# Patient Record
Sex: Female | Born: 1990
Health system: Southern US, Community
[De-identification: ages and names within clinical notes are randomized; demographics above are authoritative.]

## PROBLEM LIST (undated history)

## (undated) ENCOUNTER — Inpatient Hospital Stay: Payer: Self-pay

## (undated) DIAGNOSIS — F32A Depression, unspecified: Secondary | ICD-10-CM

## (undated) DIAGNOSIS — E669 Obesity, unspecified: Secondary | ICD-10-CM

## (undated) DIAGNOSIS — Z8616 Personal history of COVID-19: Secondary | ICD-10-CM

## (undated) DIAGNOSIS — E1110 Type 2 diabetes mellitus with ketoacidosis without coma: Secondary | ICD-10-CM

## (undated) DIAGNOSIS — E119 Type 2 diabetes mellitus without complications: Secondary | ICD-10-CM

## (undated) DIAGNOSIS — Z9641 Presence of insulin pump (external) (internal): Secondary | ICD-10-CM

## (undated) DIAGNOSIS — K8050 Calculus of bile duct without cholangitis or cholecystitis without obstruction: Secondary | ICD-10-CM

## (undated) DIAGNOSIS — O139 Gestational [pregnancy-induced] hypertension without significant proteinuria, unspecified trimester: Secondary | ICD-10-CM

## (undated) DIAGNOSIS — I10 Essential (primary) hypertension: Secondary | ICD-10-CM

## (undated) HISTORY — PX: WISDOM TOOTH EXTRACTION: SHX21

## (undated) HISTORY — DX: Essential (primary) hypertension: I10

## (undated) HISTORY — PX: CHOLECYSTECTOMY: SHX55

---
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
## 2011-03-03 ENCOUNTER — Emergency Department: Payer: Self-pay | Admitting: Emergency Medicine

## 2011-06-08 ENCOUNTER — Observation Stay: Payer: Self-pay | Admitting: Obstetrics and Gynecology

## 2011-08-01 DIAGNOSIS — O1410 Severe pre-eclampsia, unspecified trimester: Secondary | ICD-10-CM

## 2013-01-16 ENCOUNTER — Emergency Department: Payer: Self-pay | Admitting: Emergency Medicine

## 2013-01-16 LAB — URINALYSIS, COMPLETE
Bilirubin,UR: NEGATIVE
Glucose,UR: 300 mg/dL (ref 0–75)
Ketone: NEGATIVE
Leukocyte Esterase: NEGATIVE
Ph: 6 (ref 4.5–8.0)
RBC,UR: 131 /HPF (ref 0–5)
Specific Gravity: 1.01 (ref 1.003–1.030)
Squamous Epithelial: 2

## 2013-01-16 LAB — HCG, QUANTITATIVE, PREGNANCY: Beta Hcg, Quant.: <1 m[IU]/mL — ABNORMAL LOW

## 2013-01-16 LAB — BASIC METABOLIC PANEL
Anion Gap: 8 (ref 7–16)
BUN: 21 mg/dL — ABNORMAL HIGH (ref 7–18)
Calcium, Total: 9.3 mg/dL (ref 8.5–10.1)
Chloride: 101 mmol/L (ref 98–107)
Creatinine: 0.68 mg/dL (ref 0.60–1.30)
EGFR (African American): >60
EGFR (Non-African Amer.): >60
Potassium: 4.2 mmol/L (ref 3.5–5.1)

## 2013-01-16 LAB — CBC
HCT: 46.2 % (ref 35.0–47.0)
HGB: 16.2 g/dL — ABNORMAL HIGH (ref 12.0–16.0)
MCH: 31.9 pg (ref 26.0–34.0)
RBC: 5.1 10*6/uL (ref 3.80–5.20)
RDW: 12.6 % (ref 11.5–14.5)
WBC: 10.5 10*3/uL (ref 3.6–11.0)

## 2013-09-23 ENCOUNTER — Emergency Department: Payer: Self-pay | Admitting: Emergency Medicine

## 2013-09-23 LAB — COMPREHENSIVE METABOLIC PANEL
ANION GAP: 4 — AB (ref 7–16)
AST: 46 U/L — AB (ref 15–37)
Albumin: 4.3 g/dL (ref 3.4–5.0)
Alkaline Phosphatase: 146 U/L — ABNORMAL HIGH
BILIRUBIN TOTAL: 1.1 mg/dL — AB (ref 0.2–1.0)
BUN: 18 mg/dL (ref 7–18)
CALCIUM: 9.4 mg/dL (ref 8.5–10.1)
CO2: 27 mmol/L (ref 21–32)
CREATININE: 0.54 mg/dL — AB (ref 0.60–1.30)
Chloride: 100 mmol/L (ref 98–107)
Glucose: 300 mg/dL — ABNORMAL HIGH (ref 65–99)
Osmolality: 276 (ref 275–301)
Potassium: 4.2 mmol/L (ref 3.5–5.1)
SGPT (ALT): 87 U/L — ABNORMAL HIGH (ref 12–78)
Sodium: 131 mmol/L — ABNORMAL LOW (ref 136–145)
Total Protein: 8.9 g/dL — ABNORMAL HIGH (ref 6.4–8.2)

## 2013-09-23 LAB — CBC WITH DIFFERENTIAL/PLATELET
Basophil #: 0.1 10*3/uL (ref 0.0–0.1)
Basophil %: 0.5 %
Eosinophil #: 0.1 10*3/uL (ref 0.0–0.7)
Eosinophil %: 0.9 %
HCT: 47.5 % — AB (ref 35.0–47.0)
HGB: 16.3 g/dL — ABNORMAL HIGH (ref 12.0–16.0)
LYMPHS ABS: 2.4 10*3/uL (ref 1.0–3.6)
LYMPHS PCT: 15.4 %
MCH: 31.2 pg (ref 26.0–34.0)
MCHC: 34.4 g/dL (ref 32.0–36.0)
MCV: 91 fL (ref 80–100)
MONOS PCT: 5.9 %
Monocyte #: 0.9 x10 3/mm (ref 0.2–0.9)
Neutrophil #: 12.2 10*3/uL — ABNORMAL HIGH (ref 1.4–6.5)
Neutrophil %: 77.3 %
Platelet: 333 10*3/uL (ref 150–440)
RBC: 5.24 10*6/uL — ABNORMAL HIGH (ref 3.80–5.20)
RDW: 12.2 % (ref 11.5–14.5)
WBC: 15.8 10*3/uL — ABNORMAL HIGH (ref 3.6–11.0)

## 2013-09-23 LAB — URINALYSIS, COMPLETE
Bilirubin,UR: NEGATIVE
Glucose,UR: >=500 mg/dL (ref 0–75)
NITRITE: POSITIVE
Ph: 6 (ref 4.5–8.0)
SPECIFIC GRAVITY: 1.031 (ref 1.003–1.030)
Squamous Epithelial: 1
WBC UR: 168 /HPF (ref 0–5)

## 2013-09-23 LAB — LIPASE, BLOOD: LIPASE: 111 U/L (ref 73–393)

## 2016-03-04 ENCOUNTER — Ambulatory Visit
Admission: EM | Admit: 2016-03-04 | Discharge: 2016-03-04 | Disposition: A | Discharge status: Home / Self Care | Payer: Worker's Compensation | Attending: Family Medicine | Admitting: Family Medicine

## 2016-03-04 ENCOUNTER — Ambulatory Visit (INDEPENDENT_AMBULATORY_CARE_PROVIDER_SITE_OTHER): Payer: Worker's Compensation

## 2016-03-04 DIAGNOSIS — S8011XA Contusion of right lower leg, initial encounter: Secondary | ICD-10-CM

## 2016-03-04 HISTORY — DX: Type 2 diabetes mellitus without complications: E11.9

## 2016-03-04 MED ORDER — ACETAMINOPHEN 500 MG PO TABS
1000.0000 mg | ORAL_TABLET | Freq: Once | ORAL | Status: AC
  Administered 2016-03-04: 1000 mg via ORAL

## 2016-03-04 MED ORDER — HYDROCODONE-ACETAMINOPHEN 5-325 MG PO TABS
ORAL_TABLET | ORAL | 0 refills | Status: DC
Start: 2016-03-04 — End: 2016-09-01

## 2016-03-04 NOTE — ED Provider Notes (Signed)
[facility]    CSN: [id number] Arrival date & time: 03/04/16  0855     History   Chief Complaint Chief Complaint  Patient presents with  . Leg Injury    Right Leg    HPI Hedda SladeJessica Weinman is a 25 y.o. female.   25 yo female presents with a c/o right ankle and lower leg pain and swelling after injury at work last night. States she slipped over some plastic pieces on the floor and lower leg hit a machine.     The history is provided by the patient.    Past Medical History:  Diagnosis Date  . Diabetes mellitus without complication (HCC)     There are no active problems to display for this patient.   History reviewed. No pertinent surgical history.  OB History    No data available       Home Medications    Prior to Admission medications   Medication Sig Start Date End Date Taking? Authorizing Provider  HYDROcodone-acetaminophen (NORCO/VICODIN) 5-325 MG tablet 1-2 tabs po qhs prn 03/04/16   Payton Mccallumrlando Ayan Yankey, MD    Family History Family History  Problem Relation Age of Onset  . Diabetes Father     Social History Social History  Substance Use Topics  . Smoking status: Former Games developermoker  . Smokeless tobacco: Never Used  . Alcohol use Yes     Allergies   Patient has no known allergies.   Review of Systems Review of Systems   Physical Exam Triage Vital Signs ED Triage Vitals  Enc Vitals Group     BP 03/04/16 0958 119/74     Pulse Rate 03/04/16 0958 90     Resp 03/04/16 0958 18     Temp 03/04/16 0958 98 F (36.7 C)     Temp Source 03/04/16 0958 Oral     SpO2 03/04/16 0958 97 %     Weight 03/04/16 0956 165 lb (74.8 kg)     Height 03/04/16 0956 4\' 11"  (1.499 m)     Head Circumference --      Peak Flow --      Pain Score 03/04/16 0958 7     Pain Loc --      Pain Edu? --      Excl. in GC? --    No data found.   Updated Vital Signs BP 119/74 (BP Location: Left Arm)   Pulse 90   Temp 98 F (36.7 C) (Oral)   Resp 18   Ht 4\' 11"   (1.499 m)   Wt 165 lb (74.8 kg)   LMP 02/19/2016 (Within Days) Comment: denies preg  SpO2 97%   BMI 33.33 kg/m   Visual Acuity Right Eye Distance:   Left Eye Distance:   Bilateral Distance:    Right Eye Near:   Left Eye Near:    Bilateral Near:     Physical Exam  Constitutional: She appears well-developed and well-nourished. No distress.  Musculoskeletal:       Right ankle: She exhibits swelling (mild). She exhibits normal range of motion, no deformity, no laceration and normal pulse. Tenderness. Lateral malleolus tenderness found. No medial malleolus, no AITFL, no CF ligament, no posterior TFL, no head of 5th metatarsal and no proximal fibula tenderness found. Achilles tendon normal.       Right lower leg: She exhibits tenderness, bony tenderness (over distal tibia) and swelling (mild). She exhibits no deformity and no laceration.  Skin: She is not diaphoretic.  Nursing note  and vitals reviewed.    UC Treatments / Results  Labs (all labs ordered are listed, but only abnormal results are displayed) Labs Reviewed - No data to display  EKG  EKG Interpretation None       Radiology Dg Tibia/fibula Right  Result Date: 03/04/2016 CLINICAL DATA:  Slipped at work last night.  Lower extremity pain. EXAM: RIGHT TIBIA AND FIBULA - 2 VIEW COMPARISON:  None. FINDINGS: There is no evidence of fracture or other focal bone lesions. Soft tissues are unremarkable. IMPRESSION: Negative. Electronically Signed   By: Paulina FusiMark  Shogry M.D.   On: 03/04/2016 10:53   Dg Ankle Complete Right  Result Date: 03/04/2016 CLINICAL DATA:  Slipped at work last night with lower leg and ankle pain. EXAM: RIGHT ANKLE - COMPLETE 3+ VIEW COMPARISON:  None. FINDINGS: There is a joint effusion.  No evidence of fracture or dislocation. IMPRESSION: Joint effusion.  No bone abnormality. Electronically Signed   By: Paulina FusiMark  Shogry M.D.   On: 03/04/2016 10:53    Procedures Procedures (including critical care  time)  Medications Ordered in UC Medications  acetaminophen (TYLENOL) tablet 1,000 mg (1,000 mg Oral Given 03/04/16 1014)     Initial Impression / Assessment and Plan / UC Course  I have reviewed the triage vital signs and the nursing notes.  Pertinent labs & imaging results that were available during my care of the patient were reviewed by me and considered in my medical decision making (see chart for details).  Clinical Course       Final Clinical Impressions(s) / UC Diagnoses   Final diagnoses:  Contusion of multiple sites of right lower extremity, initial encounter    New Prescriptions Discharge Medication List as of 03/04/2016 11:38 AM    START taking these medications   Details  HYDROcodone-acetaminophen (NORCO/VICODIN) 5-325 MG tablet 1-2 tabs po qhs prn, Print       1. x-ray results and diagnosis reviewed with patient 2. rx as per orders above; reviewed possible side effects, interactions, risks and benefits  3. Recommend supportive treatment with rest, otc analgesics, ice, work restrictions for one week 4. Follow[facility] 1 week [facility]   Payton Mccallumrlando January Bergthold, MD 03/04/16 2134

## 2016-03-04 NOTE — ED Triage Notes (Signed)
Pt slipped and fell on her right leg at work last night and the majority of the pain is in her right ankle.

## 2016-03-07 ENCOUNTER — Telehealth: Payer: Self-pay | Admitting: *Deleted

## 2016-03-07 NOTE — Telephone Encounter (Signed)
Courtesy call back, verified DOB, patient reported feeling some better, but still very sore. Advised patient to follow up with PCP or [facility] if symptoms do not resolve.

## 2016-08-29 ENCOUNTER — Encounter: Payer: Self-pay | Admitting: Emergency Medicine

## 2016-08-29 ENCOUNTER — Emergency Department
Admission: EM | Admit: 2016-08-29 | Discharge: 2016-08-29 | Disposition: A | Discharge status: Home / Self Care | Payer: Self-pay | Attending: Emergency Medicine | Admitting: Emergency Medicine

## 2016-08-29 ENCOUNTER — Emergency Department: Payer: Self-pay

## 2016-08-29 DIAGNOSIS — N898 Other specified noninflammatory disorders of vagina: Secondary | ICD-10-CM | POA: Insufficient documentation

## 2016-08-29 DIAGNOSIS — O4690 Antepartum hemorrhage, unspecified, unspecified trimester: Secondary | ICD-10-CM

## 2016-08-29 DIAGNOSIS — Z3A01 Less than 8 weeks gestation of pregnancy: Secondary | ICD-10-CM | POA: Insufficient documentation

## 2016-08-29 DIAGNOSIS — O209 Hemorrhage in early pregnancy, unspecified: Secondary | ICD-10-CM | POA: Insufficient documentation

## 2016-08-29 DIAGNOSIS — Z87891 Personal history of nicotine dependence: Secondary | ICD-10-CM | POA: Insufficient documentation

## 2016-08-29 DIAGNOSIS — E119 Type 2 diabetes mellitus without complications: Secondary | ICD-10-CM | POA: Insufficient documentation

## 2016-08-29 LAB — COMPREHENSIVE METABOLIC PANEL
ALT: 141 U/L — ABNORMAL HIGH (ref 14–54)
ANION GAP: 7 (ref 5–15)
AST: 90 U/L — AB (ref 15–41)
Albumin: 4.1 g/dL (ref 3.5–5.0)
Alkaline Phosphatase: 84 U/L (ref 38–126)
BUN: 14 mg/dL (ref 6–20)
CALCIUM: 9 mg/dL (ref 8.9–10.3)
CO2: 24 mmol/L (ref 22–32)
CREATININE: 0.43 mg/dL — AB (ref 0.44–1.00)
Chloride: 103 mmol/L (ref 101–111)
GLUCOSE: 236 mg/dL — AB (ref 65–99)
POTASSIUM: 3.7 mmol/L (ref 3.5–5.1)
Sodium: 134 mmol/L — ABNORMAL LOW (ref 135–145)
Total Bilirubin: 0.8 mg/dL (ref 0.3–1.2)
Total Protein: 7.4 g/dL (ref 6.5–8.1)

## 2016-08-29 LAB — CBC
HCT: 43.1 % (ref 35.0–47.0)
HEMOGLOBIN: 14.7 g/dL (ref 12.0–16.0)
MCH: 30.5 pg (ref 26.0–34.0)
MCHC: 34 g/dL (ref 32.0–36.0)
MCV: 89.6 fL (ref 80.0–100.0)
PLATELETS: 318 10*3/uL (ref 150–440)
RBC: 4.81 MIL/uL (ref 3.80–5.20)
RDW: 13.2 % (ref 11.5–14.5)
WBC: 7.6 10*3/uL (ref 3.6–11.0)

## 2016-08-29 LAB — HCG, QUANTITATIVE, PREGNANCY: HCG, BETA CHAIN, QUANT, S: 12575 m[IU]/mL — AB (ref <5)

## 2016-08-29 LAB — POCT PREGNANCY, URINE: Preg Test, Ur: POSITIVE — AB

## 2016-08-29 LAB — ABO/RH: ABO/RH(D): O POS

## 2016-08-29 NOTE — ED Provider Notes (Signed)
[facility] Emergency Department Provider Note   ____________________________________________    I have reviewed the triage vital signs and the nursing notes.   HISTORY  Chief Complaint Vaginal Bleeding     HPI Madison Harrell is a 26 y.o. female who reports she is pregnant but does not know how far along she has who presents with complaints of vaginal bleeding. She reports that around 3 PM she wiped after urinating and noticed blood on the tissue paper. This occurred again in the waiting room. She reports a mild amount of pelvic pressure. No nausea or vomiting. No fevers or chills. G2 P1. Also reports a history of diabetes but is apparently not on any medications   Past Medical History:  Diagnosis Date  . Diabetes mellitus without complication (HCC)     There are no active problems to display for this patient.   No past surgical history on file.  Prior to Admission medications   Medication Sig Start Date End Date Taking? Authorizing Provider  HYDROcodone-acetaminophen (NORCO/VICODIN) 5-325 MG tablet 1-2 tabs po qhs prn 03/04/16   Payton Mccallum, MD     Allergies Patient has no known allergies.  Family History  Problem Relation Age of Onset  . Diabetes Father     Social History Social History  Substance Use Topics  . Smoking status: Former Games developer  . Smokeless tobacco: Never Used  . Alcohol use Yes    Review of Systems  Constitutional: No fever/chills Eyes: No visual changes.  ENT: No sore throat. Cardiovascular: Denies chest pain. Respiratory: Denies shortness of breath. Gastrointestinal:  No nausea, no vomiting.   Genitourinary: Negative for dysuria.Positive vaginal bleeding as above, pelvic pain as above Musculoskeletal: Negative for back pain. Skin: Negative for rash. Neurological: Negative for headaches    ____________________________________________   PHYSICAL EXAM:  VITAL SIGNS: ED Triage Vitals  Enc Vitals  Group     BP 08/29/16 1613 131/78     Pulse Rate 08/29/16 1613 80     Resp 08/29/16 1613 18     Temp 08/29/16 1613 98 F (36.7 C)     Temp Source 08/29/16 1613 Oral     SpO2 08/29/16 1613 99 %     Weight 08/29/16 1613 179 lb (81.2 kg)     Height 08/29/16 1613 4\' 11"  (1.499 m)     Head Circumference --      Peak Flow --      Pain Score 08/29/16 1801 1     Pain Loc --      Pain Edu? --      Excl. in GC? --     Constitutional: Alert and oriented. No acute distress. Pleasant and interactive Eyes: Conjunctivae are normal.   Nose: No congestion/rhinnorhea. Mouth/Throat: Mucous membranes are moist.    Cardiovascular: Normal rate, regular rhythm. Grossly normal heart sounds.  Good peripheral circulation. Respiratory: Normal respiratory effort.  No retractions. Lungs CTAB. Gastrointestinal: Soft and nontender. No distention.  No CVA tenderness. Genitourinary: deferred Musculoskeletal:  Warm and well perfused Neurologic:  Normal speech and language. No gross focal neurologic deficits are appreciated.  Skin:  Skin is warm, dry and intact. No rash noted. Psychiatric: Mood and affect are normal. Speech and behavior are normal.  ____________________________________________   LABS (all labs ordered are listed, but only abnormal results are displayed)  Labs Reviewed  HCG, QUANTITATIVE, PREGNANCY - Abnormal; Notable for the following:       Result Value   hCG, Beta Chain, Quant,  S 12,575 (*)    All other components within normal limits  COMPREHENSIVE METABOLIC PANEL - Abnormal; Notable for the following:    Sodium 134 (*)    Glucose, Bld 236 (*)    Creatinine, Ser 0.43 (*)    AST 90 (*)    ALT 141 (*)    All other components within normal limits  POCT PREGNANCY, URINE - Abnormal; Notable for the following:    Preg Test, Ur POSITIVE (*)    All other components within normal limits  CBC  HIV ANTIBODY (ROUTINE TESTING)  POC URINE PREG, ED  ABO/RH    ____________________________________________  EKG  None ____________________________________________  RADIOLOGY  Ultrasound 6 week 4 day IUP ____________________________________________   PROCEDURES  Procedure(s) performed: No    Critical Care performed: No ____________________________________________   INITIAL IMPRESSION / ASSESSMENT AND PLAN / ED COURSE  Pertinent labs & imaging results that were available during my care of the patient were reviewed by me and considered in my medical decision making (see chart for details).  Patient is Rh+, 6 week 4 day IUP on ultrasound. Patient [facility] follow[facility] with [facility] to evaluate her diabetes and provide further obstetric care    ____________________________________________   FINAL CLINICAL IMPRESSION(S) / ED DIAGNOSES  Final diagnoses:  Vaginal bleeding in pregnancy      NEW MEDICATIONS STARTED DURING THIS VISIT:  Discharge Medication List as of 08/29/2016  7:42 PM       Note:  This document was prepared using Dragon voice recognition software and may include unintentional dictation errors.    Jene Everyobert Jahlon Baines, MD 08/29/16 2109

## 2016-08-29 NOTE — ED Triage Notes (Signed)
Pt reports she is pregnant, gestational age unknown. Pt states her periods have always been irregular but she had a positive home pregnancy test that was confirmed at the clinic. Pt states test was taken 08/15/16. Pt reports having bright red bleeding on tissue when she went to the bathroom this morning. Pt denies having to use a pad. Denies pain.

## 2016-08-29 NOTE — ED Notes (Signed)
Spoke with pt and she states bleeding is continuing but is not worse.  Pt appears in no acute distress.

## 2016-09-01 ENCOUNTER — Encounter: Payer: Self-pay | Admitting: Emergency Medicine

## 2016-09-01 ENCOUNTER — Emergency Department
Admission: EM | Admit: 2016-09-01 | Discharge: 2016-09-01 | Disposition: A | Discharge status: Home / Self Care | Payer: Managed Care, Other (non HMO) | Attending: Emergency Medicine | Admitting: Emergency Medicine

## 2016-09-01 ENCOUNTER — Emergency Department: Payer: Managed Care, Other (non HMO)

## 2016-09-01 DIAGNOSIS — Z3A01 Less than 8 weeks gestation of pregnancy: Secondary | ICD-10-CM | POA: Insufficient documentation

## 2016-09-01 DIAGNOSIS — E119 Type 2 diabetes mellitus without complications: Secondary | ICD-10-CM | POA: Insufficient documentation

## 2016-09-01 DIAGNOSIS — Z87891 Personal history of nicotine dependence: Secondary | ICD-10-CM | POA: Insufficient documentation

## 2016-09-01 DIAGNOSIS — O039 Complete or unspecified spontaneous abortion without complication: Secondary | ICD-10-CM | POA: Insufficient documentation

## 2016-09-01 DIAGNOSIS — R102 Pelvic and perineal pain: Secondary | ICD-10-CM | POA: Insufficient documentation

## 2016-09-01 LAB — CBC WITH DIFFERENTIAL/PLATELET
BASOS ABS: 0.1 10*3/uL (ref 0–0.1)
BASOS PCT: 1 %
Eosinophils Absolute: 0.1 10*3/uL (ref 0–0.7)
Eosinophils Relative: 1 %
HCT: 42.4 % (ref 35.0–47.0)
HEMOGLOBIN: 15 g/dL (ref 12.0–16.0)
Lymphocytes Relative: 25 %
Lymphs Abs: 2.4 10*3/uL (ref 1.0–3.6)
MCH: 31.6 pg (ref 26.0–34.0)
MCHC: 35.3 g/dL (ref 32.0–36.0)
MCV: 89.5 fL (ref 80.0–100.0)
MONOS PCT: 8 %
Monocytes Absolute: 0.8 10*3/uL (ref 0.2–0.9)
NEUTROS ABS: 6.6 10*3/uL — AB (ref 1.4–6.5)
NEUTROS PCT: 65 %
Platelets: 325 10*3/uL (ref 150–440)
RBC: 4.74 MIL/uL (ref 3.80–5.20)
RDW: 13.3 % (ref 11.5–14.5)
WBC: 10 10*3/uL (ref 3.6–11.0)

## 2016-09-01 LAB — HCG, QUANTITATIVE, PREGNANCY: HCG, BETA CHAIN, QUANT, S: 9879 m[IU]/mL — AB (ref <5)

## 2016-09-01 NOTE — ED Provider Notes (Signed)
[facility] Emergency Department Provider Note   ____________________________________________   First MD Initiated Contact with Patient 09/01/16 [phone]     (approximate)  I have reviewed the triage vital signs and the nursing notes.   HISTORY  Chief Complaint Abdominal Pain and Vaginal Bleeding    HPI Madison Harrell is a 26 y.o. female G2P1 who returns to the emergency department with a chief complaint of vaginal bleeding and pelvic cramping.Patient was seen on 5/4 for same with ultrasound demonstrating IUP at 6 weeks and 4 days. Over the weekend patient has continued to bleed, now heavier bleeding with clots associated with pelvic cramping. Patient denies fever, chills, chest pain, shortness of breath, abdominal pain, nausea, vomiting. Denies recent travel or trauma. Nothing makes her symptoms better or worse.   Past Medical History:  Diagnosis Date  . Diabetes mellitus without complication (HCC)     There are no active problems to display for this patient.   History reviewed. No pertinent surgical history.  Prior to Admission medications   Not on File    Allergies Patient has no known allergies.  Family History  Problem Relation Age of Onset  . Diabetes Father     Social History Social History  Substance Use Topics  . Smoking status: Former Games developermoker  . Smokeless tobacco: Never Used  . Alcohol use No    Review of Systems  Constitutional: No fever/chills. Eyes: No visual changes. ENT: No sore throat. Cardiovascular: Denies chest pain. Respiratory: Denies shortness of breath. Gastrointestinal: Positive for pelvic pain. No abdominal pain.  No nausea, no vomiting.  No diarrhea.  No constipation. Genitourinary: Positive for vaginal bleeding. Negative for dysuria. Musculoskeletal: Negative for back pain. Skin: Negative for rash. Neurological: Negative for headaches, focal weakness or  numbness.   ____________________________________________   PHYSICAL EXAM:  VITAL SIGNS: ED Triage Vitals  Enc Vitals Group     BP 09/01/16 0245 125/72     Pulse Rate 09/01/16 0245 86     Resp 09/01/16 0245 14     Temp 09/01/16 0245 98.2 F (36.8 C)     Temp Source 09/01/16 0245 Oral     SpO2 09/01/16 0245 98 %     Weight 09/01/16 0238 179 lb (81.2 kg)     Height 09/01/16 0238 4\' 11"  (1.499 m)     Head Circumference --      Peak Flow --      Pain Score --      Pain Loc --      Pain Edu? --      Excl. in GC? --     Constitutional: Alert and oriented. Well appearing and in no acute distress.  Eyes: Conjunctivae are normal. PERRL. EOMI. Head: Atraumatic. Nose: No congestion/rhinnorhea. Mouth/Throat: Mucous membranes are moist.  Oropharynx non-erythematous. Neck: No stridor.   Cardiovascular: Normal rate, regular rhythm. Grossly normal heart sounds.  Good peripheral circulation. Respiratory: Normal respiratory effort.  No retractions. Lungs CTAB. Gastrointestinal: Soft and nontender to light and deep palpation. No distention. No abdominal bruits. No CVA tenderness. Musculoskeletal: No lower extremity tenderness nor edema.  No joint effusions. Neurologic:  Normal speech and language. No gross focal neurologic deficits are appreciated. No gait instability. Skin:  Skin is warm, dry and intact. No rash noted. Psychiatric: Mood and affect are normal. Speech and behavior are normal.  ____________________________________________   LABS (all labs ordered are listed, but only abnormal results are displayed)  Labs Reviewed  HCG, QUANTITATIVE, PREGNANCY - Abnormal;  Notable for the following:       Result Value   hCG, Beta Chain, Quant, S 9,879 (*)    All other components within normal limits  CBC WITH DIFFERENTIAL/PLATELET - Abnormal; Notable for the following:    Neutro Abs 6.6 (*)    All other components within normal limits    ____________________________________________  EKG  None ____________________________________________  RADIOLOGY  Ultrasound discussed with Dr. Karie Kirks: No sonographically identified IUP. Given presence of IUP 3 days ago  and declining beta HCG this is consistent with miscarriage.   ____________________________________________   PROCEDURES  Procedure(s) performed:  Pelvic exam: External exam WNL without rashes, lesions or vesicles. Speculum exam reveals mild vaginal bleeding. Cervical os closed. Bimanual exam WNL.  Procedures  Critical Care performed: No  ____________________________________________   INITIAL IMPRESSION / ASSESSMENT AND PLAN / ED COURSE  Pertinent labs & imaging results that were available during my care of the patient were reviewed by me and considered in my medical decision making (see chart for details).  26 year old female G2 P1 approximately [redacted] weeks pregnant with worsening vaginal bleeding. Beta hCG has decreased from 3 days ago. Suspect miscarriage in process. Awaiting results of ultrasound. Review of chart reveals patient is blood type O+.  Clinical Course as of Sep 02 623  Mon Sep 01, 2016  1610 Updated patient and spouse of ultrasound results. Spent some time answering their questions regarding miscarriage. Strict return precautions given. Both verbalize understanding and agree with plan of care.  [JS]    Clinical Course User Index [JS] Irean Hong, MD     ____________________________________________   FINAL CLINICAL IMPRESSION(S) / ED DIAGNOSES  Final diagnoses:  Miscarriage      NEW MEDICATIONS STARTED DURING THIS VISIT:  New Prescriptions   No medications on file     Note:  This document was prepared using Dragon voice recognition software and may include unintentional dictation errors.    Irean Hong, MD 09/01/16 [phone]

## 2016-09-01 NOTE — ED Notes (Signed)
Patient transported to Ultrasound 

## 2016-09-01 NOTE — ED Notes (Signed)
Patient returned to room from US. Husband at bedside. MD notified of patient's return and wishes to perform pelvic exam. Cart to bedside.

## 2016-09-01 NOTE — ED Notes (Signed)
MD in to perform pelvic exam however patient is very tearful at this time. MD will wait to perform pelvic.

## 2016-09-01 NOTE — Discharge Instructions (Signed)
Avoid tampons, douching or sexual intercourse until seen by your doctor. Drink plenty of fluids daily. Return to the ER for worsening symptoms, soaking more than 1 pad per hour, fainting or other concerns.

## 2016-09-01 NOTE — ED Triage Notes (Signed)
Pt seen here on 08/29/16 with vaginal bleeding and found out she is pregnant; estimated due date 04/20/17; pt returns tonight with continued vaginal bleeding since then and now abdominal cramping; pt tearful;

## 2017-04-22 ENCOUNTER — Other Ambulatory Visit: Payer: Self-pay

## 2017-04-22 ENCOUNTER — Emergency Department
Admission: EM | Admit: 2017-04-22 | Discharge: 2017-04-22 | Disposition: A | Discharge status: Home / Self Care | Payer: 59 | Attending: Emergency Medicine | Admitting: Emergency Medicine

## 2017-04-22 ENCOUNTER — Emergency Department: Payer: 59

## 2017-04-22 DIAGNOSIS — O208 Other hemorrhage in early pregnancy: Secondary | ICD-10-CM | POA: Diagnosis present

## 2017-04-22 DIAGNOSIS — Z87891 Personal history of nicotine dependence: Secondary | ICD-10-CM | POA: Diagnosis not present

## 2017-04-22 DIAGNOSIS — Z3A01 Less than 8 weeks gestation of pregnancy: Secondary | ICD-10-CM | POA: Insufficient documentation

## 2017-04-22 DIAGNOSIS — Z794 Long term (current) use of insulin: Secondary | ICD-10-CM | POA: Insufficient documentation

## 2017-04-22 DIAGNOSIS — O4690 Antepartum hemorrhage, unspecified, unspecified trimester: Secondary | ICD-10-CM

## 2017-04-22 DIAGNOSIS — N939 Abnormal uterine and vaginal bleeding, unspecified: Secondary | ICD-10-CM

## 2017-04-22 DIAGNOSIS — E119 Type 2 diabetes mellitus without complications: Secondary | ICD-10-CM | POA: Diagnosis not present

## 2017-04-22 LAB — CBC WITH DIFFERENTIAL/PLATELET
Basophils Absolute: 0.1 10*3/uL (ref 0–0.1)
Basophils Relative: 1 %
EOS ABS: 0.1 10*3/uL (ref 0–0.7)
Eosinophils Relative: 1 %
HCT: 42.1 % (ref 35.0–47.0)
HEMOGLOBIN: 14.7 g/dL (ref 12.0–16.0)
LYMPHS ABS: 2.5 10*3/uL (ref 1.0–3.6)
LYMPHS PCT: 28 %
MCH: 31.5 pg (ref 26.0–34.0)
MCHC: 34.8 g/dL (ref 32.0–36.0)
MCV: 90.4 fL (ref 80.0–100.0)
MONOS PCT: 7 %
Monocytes Absolute: 0.6 10*3/uL (ref 0.2–0.9)
NEUTROS PCT: 63 %
Neutro Abs: 5.7 10*3/uL (ref 1.4–6.5)
Platelets: 313 10*3/uL (ref 150–440)
RBC: 4.66 MIL/uL (ref 3.80–5.20)
RDW: 12.7 % (ref 11.5–14.5)
WBC: 8.9 10*3/uL (ref 3.6–11.0)

## 2017-04-22 LAB — URINALYSIS, COMPLETE (UACMP) WITH MICROSCOPIC
BACTERIA UA: NONE SEEN
BILIRUBIN URINE: NEGATIVE
Bilirubin Urine: NEGATIVE
Glucose, UA: >=500 mg/dL — AB
Hgb urine dipstick: NEGATIVE
KETONES UR: NEGATIVE mg/dL
Ketones, ur: 20 mg/dL — AB
LEUKOCYTES UA: NEGATIVE
NITRITE: POSITIVE — AB
Nitrite: NEGATIVE
PH: 5 (ref 5.0–8.0)
PH: 7 (ref 5.0–8.0)
PROTEIN: 100 mg/dL — AB
PROTEIN: NEGATIVE mg/dL
Specific Gravity, Urine: 1.024 (ref 1.005–1.030)
Specific Gravity, Urine: 1.028 (ref 1.005–1.030)

## 2017-04-22 LAB — HCG, QUANTITATIVE, PREGNANCY: HCG, BETA CHAIN, QUANT, S: 25315 m[IU]/mL — AB (ref <5)

## 2017-04-22 LAB — BASIC METABOLIC PANEL
ANION GAP: 9 (ref 5–15)
BUN: 14 mg/dL (ref 6–20)
CO2: 24 mmol/L (ref 22–32)
Calcium: 8.7 mg/dL — ABNORMAL LOW (ref 8.9–10.3)
Chloride: 101 mmol/L (ref 101–111)
Creatinine, Ser: 0.42 mg/dL — ABNORMAL LOW (ref 0.44–1.00)
GLUCOSE: 289 mg/dL — AB (ref 65–99)
POTASSIUM: 3.2 mmol/L — AB (ref 3.5–5.1)
Sodium: 134 mmol/L — ABNORMAL LOW (ref 135–145)

## 2017-04-22 LAB — ABO/RH: ABO/RH(D): O POS

## 2017-04-22 LAB — POCT PREGNANCY, URINE: Preg Test, Ur: POSITIVE — AB

## 2017-04-22 MED ORDER — SODIUM CHLORIDE 0.9 % IV BOLUS (SEPSIS)
1000.0000 mL | Freq: Once | INTRAVENOUS | Status: AC
Start: 2017-04-22 — End: 2017-04-22
  Administered 2017-04-22: 1000 mL via INTRAVENOUS

## 2017-04-22 NOTE — ED Provider Notes (Addendum)
[facility] Emergency Department Provider Note  ____________________________________________   I have reviewed the triage vital signs and the nursing notes. Where available I have reviewed prior notes and, if possible and indicated, outside hospital notes.    HISTORY  Chief Complaint Vaginal Bleeding    HPI Madison SladeJessica Harrell is a 26 y.o. female who presents today complaining of vaginal spotting patient is G5P1, she has had 3 miscarriages in early pregnancy in the past, [facility], for the last 2 3 days she has had spotting, she also complains of chronic nausea.  She denies any fever chills or vomiting at this time no diarrhea.  She has no significant cramping although she had some slight cramping yesterday.  She would prefer not to have a pelvic exam.  She is now passing clots just spotting.  No dysuria no urinary frequency no diarrhea no other complaints.    Past Medical History:  Diagnosis Date  . Diabetes mellitus without complication (HCC)     There are no active problems to display for this patient.   History reviewed. No pertinent surgical history.  Prior to Admission medications   Medication Sig Start Date End Date Taking? Authorizing Provider  HUMALOG 100 UNIT/ML injection Inject 12 Units into the skin daily. 9units-PM 04/17/17  Yes [provider]  metFORMIN (GLUCOPHAGE-XR) 500 MG 24 hr tablet Take 1 tablet by mouth 4 (four) times daily.  02/24/17  Yes [provider]  prenatal vitamin w/FE, FA (PRENATAL 1 + 1) 27-1 MG TABS tablet Take 1 tablet by mouth daily at 12 noon.   Yes [provider]    Allergies Patient has no known allergies.  Family History  Problem Relation Age of Onset  . Diabetes Father     Social History Social History   Tobacco Use  . Smoking status: Former Games developermoker  . Smokeless tobacco: Never Used  Substance Use Topics  . Alcohol use: No  . Drug use: No    Review of  Systems Constitutional: No fever/chills Eyes: No visual changes. ENT: No sore throat. No stiff neck no neck pain Cardiovascular: Denies chest pain. Respiratory: Denies shortness of breath. Gastrointestinal:   no vomiting.  No diarrhea.  No constipation. Genitourinary: Negative for dysuria. Musculoskeletal: Negative lower extremity swelling Skin: Negative for rash. Neurological: Negative for severe headaches, focal weakness or numbness.   ____________________________________________   PHYSICAL EXAM:  VITAL SIGNS: ED Triage Vitals  Enc Vitals Group     BP 04/22/17 1024 (!) 149/85     Pulse Rate 04/22/17 1024 77     Resp 04/22/17 1024 18     Temp 04/22/17 1024 98.4 F (36.9 C)     Temp Source 04/22/17 1024 Oral     SpO2 04/22/17 1024 99 %     Weight 04/22/17 1024 166 lb (75.3 kg)     Height 04/22/17 1024 4\' 11"  (1.499 m)     Head Circumference --      Peak Flow --      Pain Score 04/22/17 1023 4     Pain Loc --      Pain Edu? --      Excl. in GC? --     Constitutional: Alert and oriented. Well appearing and in no acute distress. Eyes: Conjunctivae are normal Head: Atraumatic HEENT: No congestion/rhinnorhea. Mucous membranes are moist.  Oropharynx non-erythematous Neck:   Nontender with no meningismus, no masses, no stridor Cardiovascular: Normal rate, regular rhythm. Grossly normal heart sounds.  Good peripheral circulation. Respiratory: Normal respiratory effort.  No retractions. Lungs CTAB. Abdominal: Soft and nontender. No distention. No guarding no rebound Back:  There is no focal tenderness or step off.  there is no midline tenderness there are no lesions noted. there is no CVA tenderness GU: Patient climbs pelvic exam at this time Musculoskeletal: No lower extremity tenderness, no upper extremity tenderness. No joint effusions, no DVT signs strong distal pulses no edema Neurologic:  Normal speech and language. No gross focal neurologic deficits are appreciated.   Skin:  Skin is warm, dry and intact. No rash noted. Psychiatric: Mood and affect are normal. Speech and behavior are normal.  ____________________________________________   LABS (all labs ordered are listed, but only abnormal results are displayed)  Labs Reviewed  HCG, QUANTITATIVE, PREGNANCY - Abnormal; Notable for the following components:      Result Value   hCG, Beta Chain, Quant, S 25,315 (*)    All other components within normal limits  POCT PREGNANCY, URINE - Abnormal; Notable for the following components:   Preg Test, Ur POSITIVE (*)    All other components within normal limits  CBC WITH DIFFERENTIAL/PLATELET  BASIC METABOLIC PANEL  URINALYSIS, COMPLETE (UACMP) WITH MICROSCOPIC  POC URINE PREG, ED  ABO/RH    Pertinent labs  results that were available during my care of the patient were reviewed by me and considered in my medical decision making (see chart for details). ____________________________________________  EKG  I personally interpreted any EKGs ordered by me or triage  ____________________________________________  RADIOLOGY  Pertinent labs & imaging results that were available during my care of the patient were reviewed by me and considered in my medical decision making (see chart for details). If possible, patient and/or family made aware of any abnormal findings.  No results found. ____________________________________________    PROCEDURES  Procedure(s) performed: None  Procedures  Critical Care performed: None  ____________________________________________   INITIAL IMPRESSION / ASSESSMENT AND PLAN / ED COURSE  Pertinent labs & imaging results that were available during my care of the patient were reviewed by me and considered in my medical decision making (see chart for details).  She did in first trimester, last menstrual period was October 18 she believes, q. 9 weeks and 6 days pregnant.  Patient has no significant abdominal pain no  history of ectopic we will obtain ultrasound, she is Rh+, most likely this is threatened AB.  ----------------------------------------- 1:40 PM on 04/22/2017 -----------------------------------------  Exam and ultrasound are reassuring patient continues to decline pelvic exam which is again not unreasonable given low likelihood of changing management at this point, she is very reassured by ultrasound findings, she does understand that this does not rule out the possibility of miscarriage in the future but at this time she does have a viable IUP.  Concerned about her urine, patient's urine appears infected but is not a very good clean catch, we will do it in and out cath which she does consent to, I have explained the possibility of further bleeding etc. return precautions were set given and understood.  Also, patient blood sugars are elevated, she states she is been drinking mostly soda over the last several days, I have advised her to change her intake.  I did suggest low Leukos Gatorade.    ____________________________________________   FINAL CLINICAL IMPRESSION(S) / ED DIAGNOSES  Final diagnoses:  None      This chart was dictated using voice recognition software.  Despite best efforts to proofread,  errors  can occur which can change meaning.      Jeanmarie Plant, MD 04/22/17 1610    Jeanmarie Plant, MD 04/22/17 1341

## 2017-04-22 NOTE — ED Notes (Signed)
Reporting vaginal bleeding X 3 days, abdominal cramping to lower abdomen right and left. Nausea and emesis during pregnancy. approx 9 weeks.

## 2017-04-22 NOTE — ED Notes (Signed)
Pt alert and oriented X4, active, cooperative, pt in NAD. RR even and unlabored, color WNL.  Pt informed to return if any life threatening symptoms occur.  Discharge and followup instructions reviewed.  

## 2017-04-22 NOTE — ED Notes (Signed)
Patient to Room 18.  RN Connye BurkittAlly aware.

## 2017-04-22 NOTE — ED Notes (Signed)
Pt to US.

## 2017-04-22 NOTE — ED Triage Notes (Signed)
Pt to ER via POV c/o vaginal bleeding X 3 days. Pt approx [redacted] weeks pregnant. Nausea and vomiting through entire pregnancy. Pt reports bleeding started after abdominal cramping on first day.

## 2017-04-23 LAB — URINE CULTURE

## 2017-04-23 NOTE — Progress Notes (Signed)
[facility] >100 K GBS in urine. Patient is pregnant with NKDA. No antibiotic was prescribed on discharge. We spoke with Dr. Mayford KnifeWilliams who gives verbal for amoxicillin 500 mg po TID x 10 days.   Spoke with patient Madison Harrell. Made her aware of UTI. She confirms she has no known drug allergies. [facility] [address] [place]. I reviewed instructions with her and answered her questions about the medication. She tells me she is no longer pregnant - she miscarried this morning.   [phone]) and spoke with Georganna Skeansonya RPh. Called in amoxicillin 500 mg po TID x 10 days, NR. She read back to me and says she start working on it now.   Giulio Bertino A. Bug Tussleookson, [place]arm.D., BCPS Clinical Pharmacist 04/23/2017 14:52

## 2017-04-23 NOTE — ED Notes (Addendum)
04/23/17 1445 Call from [facility], urine culture growing >100,000 group b strep, pt preg. Ed pharm and Dr Mayford Knifewilliams notified. Per ED pharm, he will call in antibiotics for pt.

## 2017-08-13 ENCOUNTER — Encounter: Payer: Self-pay | Admitting: Emergency Medicine

## 2017-08-13 ENCOUNTER — Emergency Department: Payer: 59

## 2017-08-13 ENCOUNTER — Other Ambulatory Visit: Payer: Self-pay

## 2017-08-13 ENCOUNTER — Emergency Department
Admission: EM | Admit: 2017-08-13 | Discharge: 2017-08-13 | Disposition: A | Discharge status: Home / Self Care | Payer: 59 | Attending: Emergency Medicine | Admitting: Emergency Medicine

## 2017-08-13 DIAGNOSIS — R079 Chest pain, unspecified: Secondary | ICD-10-CM | POA: Diagnosis present

## 2017-08-13 DIAGNOSIS — M940 Chondrocostal junction syndrome [Tietze]: Secondary | ICD-10-CM | POA: Diagnosis not present

## 2017-08-13 DIAGNOSIS — Z87891 Personal history of nicotine dependence: Secondary | ICD-10-CM | POA: Insufficient documentation

## 2017-08-13 DIAGNOSIS — R739 Hyperglycemia, unspecified: Secondary | ICD-10-CM

## 2017-08-13 DIAGNOSIS — E1165 Type 2 diabetes mellitus with hyperglycemia: Secondary | ICD-10-CM | POA: Insufficient documentation

## 2017-08-13 DIAGNOSIS — Z794 Long term (current) use of insulin: Secondary | ICD-10-CM | POA: Diagnosis not present

## 2017-08-13 LAB — BASIC METABOLIC PANEL
ANION GAP: 8 (ref 5–15)
BUN: 15 mg/dL (ref 6–20)
CALCIUM: 9.2 mg/dL (ref 8.9–10.3)
CO2: 26 mmol/L (ref 22–32)
Chloride: 98 mmol/L — ABNORMAL LOW (ref 101–111)
Creatinine, Ser: 0.44 mg/dL (ref 0.44–1.00)
Glucose, Bld: 350 mg/dL — ABNORMAL HIGH (ref 65–99)
POTASSIUM: 3.9 mmol/L (ref 3.5–5.1)
SODIUM: 132 mmol/L — AB (ref 135–145)

## 2017-08-13 LAB — CBC
HEMATOCRIT: 44.9 % (ref 35.0–47.0)
HEMOGLOBIN: 15.9 g/dL (ref 12.0–16.0)
MCH: 31.3 pg (ref 26.0–34.0)
MCHC: 35.3 g/dL (ref 32.0–36.0)
MCV: 88.6 fL (ref 80.0–100.0)
Platelets: 341 10*3/uL (ref 150–440)
RBC: 5.07 MIL/uL (ref 3.80–5.20)
RDW: 12.5 % (ref 11.5–14.5)
WBC: 8.8 10*3/uL (ref 3.6–11.0)

## 2017-08-13 LAB — POCT PREGNANCY, URINE: PREG TEST UR: NEGATIVE

## 2017-08-13 LAB — TSH: TSH: 3.759 u[IU]/mL (ref 0.350–4.500)

## 2017-08-13 LAB — TROPONIN I

## 2017-08-13 MED ORDER — SODIUM CHLORIDE 0.9 % IV BOLUS
1000.0000 mL | Freq: Once | INTRAVENOUS | Status: AC
  Administered 2017-08-13: 1000 mL via INTRAVENOUS

## 2017-08-13 MED ORDER — METFORMIN HCL 1000 MG PO TABS: 1000.0000 mg | ORAL_TABLET | Freq: Two times a day (BID) | ORAL | 0 refills | Status: DC

## 2017-08-13 MED ORDER — IBUPROFEN 800 MG PO TABS
800.0000 mg | ORAL_TABLET | Freq: Once | ORAL | Status: AC
  Administered 2017-08-13: 800 mg via ORAL
  Filled 2017-08-13: qty 1

## 2017-08-13 MED ORDER — IBUPROFEN 800 MG PO TABS: 800.0000 mg | ORAL_TABLET | Freq: Three times a day (TID) | ORAL | 0 refills | Status: DC | PRN

## 2017-08-13 MED ORDER — METFORMIN HCL 500 MG PO TABS
1000.0000 mg | ORAL_TABLET | Freq: Once | ORAL | Status: AC
  Administered 2017-08-13: 1000 mg via ORAL
  Filled 2017-08-13: qty 2

## 2017-08-13 NOTE — ED Triage Notes (Signed)
Pulled for EKG.

## 2017-08-13 NOTE — ED Triage Notes (Signed)
Cp and dizziness x 2 days, no other symptoms.

## 2017-08-13 NOTE — ED Notes (Addendum)
Dr. Manson PasseyBrown at bedside. Pt states she is a diabetic, hasn't taken meds in 2 months. Is prescribed metformin but states can't afford it. States she has been prescribed insulin for 6 years but couldn't afford it either, states humolog and novolog. Was on glipizide but was taken off when pregnant.

## 2017-08-13 NOTE — ED Notes (Signed)
Pt states central CP and dizziness x 2 days. Pt denies cardiac hx, states has been dizzy before. Describes dizziness as "I thought I was going to pass out." denies any associating symptoms such as N&V, SOB, cough, congestion. Alert, oriented. No distress noted. Visitor at bedside. Talking in complete sentences.

## 2017-08-13 NOTE — ED Provider Notes (Signed)
[facility] Emergency Department Provider Note   First MD Initiated Contact with Patient 08/13/17 1208     (approximate)  I have reviewed the triage vital signs and the nursing notes.   HISTORY  Chief Complaint Chest Pain and Dizziness    HPI Madison SladeJessica Harrell is a 27 y.o. female With history of diabetescurrently noncompliant with medications secondary to cost presents to the emergency department with2 day history of central chest discomfort is worse with palpation and movement and lifting of the objects. Patient states pain is currently 7 out of 10 and described as sharp. Patient denies any dyspnea. Patient denies any lower external pain or swelling.   Past Medical History:  Diagnosis Date  . Diabetes mellitus without complication (HCC)     There are no active problems to display for this patient.   History reviewed. No pertinent surgical history.  Prior to Admission medications   Medication Sig Start Date End Date Taking? Authorizing Provider  HUMALOG 100 UNIT/ML injection Inject 12 Units into the skin daily. 9units-PM 04/17/17   [provider]  ibuprofen (ADVIL,MOTRIN) 800 MG tablet Take 1 tablet (800 mg total) by mouth every 8 (eight) hours as needed. 08/13/17   Darci CurrentBrown, [place] N, MD  metFORMIN (GLUCOPHAGE) 1000 MG tablet Take 1 tablet (1,000 mg total) by mouth 2 (two) times daily with a meal. 08/13/17 11/11/17  Darci CurrentBrown,  N, MD  metFORMIN (GLUCOPHAGE-XR) 500 MG 24 hr tablet Take 1 tablet by mouth 4 (four) times daily.  02/24/17   [provider]  prenatal vitamin w/FE, FA (PRENATAL 1 + 1) 27-1 MG TABS tablet Take 1 tablet by mouth daily at 12 noon.    [provider]    Allergies no known drug allergies  Family History  Problem Relation Age of Onset  . Diabetes Father     Social History Social History   Tobacco Use  . Smoking status: Former Games developermoker  . Smokeless tobacco: Never Used  Substance Use Topics  .  Alcohol use: No  . Drug use: No    Review of Systems Constitutional: No fever/chills Eyes: No visual changes. ENT: No sore throat. Cardiovascular:positive for chest pain. Respiratory: Denies shortness of breath. Gastrointestinal: No abdominal pain.  No nausea, no vomiting.  No diarrhea.  No constipation. Genitourinary: Negative for dysuria. Musculoskeletal: Negative for neck pain.  Negative for back pain. Integumentary: Negative for rash. Neurological: Negative for headaches, focal weakness or numbness.   ____________________________________________   PHYSICAL EXAM:  VITAL SIGNS: ED Triage Vitals  Enc Vitals Group     BP 08/13/17 1107 123/89     Pulse Rate 08/13/17 1107 86     Resp 08/13/17 1107 18     Temp 08/13/17 1107 98.2 F (36.8 C)     Temp src --      SpO2 08/13/17 1107 100 %     Weight 08/13/17 1108 80.7 kg (178 lb)     Height 08/13/17 1108 1.499 m (4\' 11" )     Head Circumference --      Peak Flow --      Pain Score 08/13/17 1107 4     Pain Loc --      Pain Edu? --      Excl. in GC? --     Constitutional: Alert and oriented. Well appearing and in no acute distress. Eyes: Conjunctivae are normal. PERRL. EOMI. Head: Atraumatic. Mouth/Throat: Mucous membranes are moist.  Oropharynx non-erythematous. Neck: No stridor.   Cardiovascular: Normal  rate, regular rhythm. Good peripheral circulation. Grossly normal heart sounds. Respiratory: Normal respiratory effort.  No retractions. Lungs CTAB. Gastrointestinal: Soft and nontender. No distention.  Musculoskeletal: No lower extremity tenderness nor edema. No gross deformities of extremities.pain with costosternal palpationbilaterally Neurologic:  Normal speech and language. No gross focal neurologic deficits are appreciated.  Skin:  Skin is warm, dry and intact. No rash noted. Psychiatric: Mood and affect are normal. Speech and behavior are normal.  ____________________________________________   LABS (all labs  ordered are listed, but only abnormal results are displayed)  Labs Reviewed  BASIC METABOLIC PANEL - Abnormal; Notable for the following components:      Result Value   Sodium 132 (*)    Chloride 98 (*)    Glucose, Bld 350 (*)    All other components within normal limits  CBC  TROPONIN I  TSH  POC URINE PREG, ED  POCT PREGNANCY, URINE   ____________________________________________  EKG  ED ECG REPORT I, [place] N Glenn Gullickson, the attending physician, personally viewed and interpreted this ECG.   Date: 08/13/2017  EKG Time: 10:37 AM  Rate: 86  Rhythm: normal sinus rhythm  Axis: normal  Intervals:normal  ST&T Change: none  ____________________________________________  RADIOLOGY I, [place] N Iliya Spivack, personally viewed and evaluated these images (plain radiographs) as part of my medical decision making, as well as reviewing the written report by the radiologist.  ED MD interpretation:  no active cardiopulmonary disease  Official radiology report(s): Dg Chest 2 View  Result Date: 08/13/2017 CLINICAL DATA:  Chest pain EXAM: CHEST - 2 VIEW COMPARISON:  None. FINDINGS: The heart size and mediastinal contours are within normal limits. Both lungs are clear. The visualized skeletal structures are unremarkable. IMPRESSION: No active cardiopulmonary disease. Electronically Signed   By: Elige Ko   On: 08/13/2017 11:49      Procedures   ____________________________________________   INITIAL IMPRESSION / ASSESSMENT AND PLAN / ED COURSE  As part of my medical decision making, I reviewed the following data within the electronic MEDICAL RECORD NUMBER   I 27 year old female presenting with above stated history and physical examconsistent with costochondritis. EKG revealed revealed no evidence of STEMI, troponin negative. Regarding patient's hyperglycemia patient given 2 L IV normal saline as well as metformin 1000 mg. Spoke with the patient at length regarding obtaining metformin on the  $4 from [place] or [employer]. Patient given a prescription for metformin and advised to follow-up with primary care provider. Regarding cost internist patient given 800 mg of ibuprofen ____________________________________________  FINAL CLINICAL IMPRESSION(S) / ED DIAGNOSES  Final diagnoses:  Costochondritis, acute  Hyperglycemia     MEDICATIONS GIVEN DURING THIS VISIT:  Medications  sodium chloride 0.9 % bolus 1,000 mL (1,000 mLs Intravenous New Bag/Given 08/13/17 1208)  sodium chloride 0.9 % bolus 1,000 mL (1,000 mLs Intravenous New Bag/Given 08/13/17 1207)  ibuprofen (ADVIL,MOTRIN) tablet 800 mg (800 mg Oral Given 08/13/17 1243)  metFORMIN (GLUCOPHAGE) tablet 1,000 mg (1,000 mg Oral Given 08/13/17 1243)     ED Discharge Orders        Ordered    metFORMIN (GLUCOPHAGE) 1000 MG tablet  2 times daily with meals     08/13/17 1337    ibuprofen (ADVIL,MOTRIN) 800 MG tablet  Every 8 hours PRN     08/13/17 1338       Note:  This document was prepared using Dragon voice recognition software and may include unintentional dictation errors.    Darci Current, MD 08/13/17 1346

## 2017-09-02 ENCOUNTER — Ambulatory Visit (INDEPENDENT_AMBULATORY_CARE_PROVIDER_SITE_OTHER): Payer: 59 | Admitting: Obstetrics and Gynecology

## 2017-09-02 ENCOUNTER — Encounter: Payer: Self-pay | Admitting: Obstetrics and Gynecology

## 2017-09-02 VITALS — BP 120/78 | HR 95 | Ht 59.0 in | Wt 166.0 lb

## 2017-09-02 DIAGNOSIS — Z30011 Encounter for initial prescription of contraceptive pills: Secondary | ICD-10-CM | POA: Diagnosis not present

## 2017-09-02 DIAGNOSIS — Z01419 Encounter for gynecological examination (general) (routine) without abnormal findings: Secondary | ICD-10-CM

## 2017-09-02 DIAGNOSIS — B3731 Acute candidiasis of vulva and vagina: Secondary | ICD-10-CM

## 2017-09-02 DIAGNOSIS — Z124 Encounter for screening for malignant neoplasm of cervix: Secondary | ICD-10-CM | POA: Diagnosis not present

## 2017-09-02 DIAGNOSIS — B373 Candidiasis of vulva and vagina: Secondary | ICD-10-CM

## 2017-09-02 LAB — POCT WET PREP WITH KOH
CLUE CELLS WET PREP PER HPF POC: NEGATIVE
KOH Prep POC: NEGATIVE
TRICHOMONAS UA: NEGATIVE
YEAST WET PREP PER HPF POC: NEGATIVE

## 2017-09-02 MED ORDER — NORETHIN ACE-ETH ESTRAD-FE 1-20 MG-MCG(24) PO TABS: 1.0000 | ORAL_TABLET | Freq: Every day | ORAL | 3 refills | Status: DC

## 2017-09-02 MED ORDER — FLUCONAZOLE 150 MG PO TABS: 150.0000 mg | ORAL_TABLET | Freq: Once | ORAL | 0 refills | Status: AC

## 2017-09-02 MED ORDER — CLOTRIMAZOLE-BETAMETHASONE 1-0.05 % EX CREA: 1.0000 "application " | TOPICAL_CREAM | Freq: Two times a day (BID) | CUTANEOUS | 0 refills | Status: DC

## 2017-09-02 NOTE — Patient Instructions (Signed)
I value your feedback and entrusting us with your care. If you get a [facility] patient survey, I would appreciate you taking the time to let us know about your experience today. Thank you! 

## 2017-09-02 NOTE — Progress Notes (Signed)
PCP:  Tanna Furry, MD   Chief Complaint  Patient presents with  . Gynecologic Exam    Rash     HPI:      [name] is a 27 y.o. G1P0 who LMP was Patient's last menstrual period was 08/28/2017 (exact date)., presents today for her annual examination.  Her menses are irregular and have been since menarche. Periods can be monthly to twice a month to Q2-3 months, lasting 4-7 days.  Dysmenorrhea severe, occurring first 2-3 days of flow. Takes NSAIDs/uses heating pad without relief. Sometimes misses activities/work due to pain. Did BC in past with some dysmen relief. Had IUD (kept moving), nexplanon (wt gain) and OCPs (no side effects).  Sex activity: single partner, contraception - none. Wants to restart pills for now and may want to conceive later. No hx of DVT/essential HTN. Has has PIH.  Last Pap: not recent; no hx of abn Hx of STDs: none  There is no FH of breast cancer. There is no FH of ovarian cancer. The patient does not do self-breast exams.  Tobacco use: The patient denies current or previous tobacco use. Alcohol use: social drinker No drug use.  Exercise: moderately active  She does get adequate calcium and Vitamin D in her diet. Labs with PCP. Has type 2 DM that is not well-controlled. Pt c/o vaginal itching without increased d/c, odor for the past month. No meds to treat. Has tried monistat-7 in past without relief. Hx of recurrent yeast vag sx.    Past Medical History:  Diagnosis Date  . Diabetes mellitus without complication [facility])     Past Surgical History:  Procedure Laterality Date  . CESAREAN SECTION  2013    Family History  Problem Relation Age of Onset  . Diabetes Father     Social History   Socioeconomic History  . Marital status: Married    Spouse name: Not on file  . Number of children: Not on file  . Years of education: Not on file  . Highest education level: Not on file  Occupational History  . Not on file  Social  Needs  . Financial resource strain: Not on file  . Food insecurity:    Worry: Not on file    Inability: Not on file  . Transportation needs:    Medical: Not on file    Non-medical: Not on file  Tobacco Use  . Smoking status: Former Games developer  . Smokeless tobacco: Never Used  Substance and Sexual Activity  . Alcohol use: No  . Drug use: No  . Sexual activity: Yes    Birth control/protection: None  Lifestyle  . Physical activity:    Days per week: Not on file    Minutes per session: Not on file  . Stress: Not on file  Relationships  . Social connections:    Talks on phone: Not on file    Gets together: Not on file    Attends religious service: Not on file    Active member of club or organization: Not on file    Attends meetings of clubs or organizations: Not on file    Relationship status: Not on file  . Intimate partner violence:    Fear of current or ex partner: Not on file    Emotionally abused: Not on file    Physically abused: Not on file    Forced sexual activity: Not on file  Other Topics Concern  . Not on file  Social History  Narrative  . Not on file    Outpatient Medications Prior to Visit  Medication Sig Dispense Refill  . glucose blood (PRECISION QID TEST) test strip Frequency:QID   Dosage:0.0     Instructions:  Note:Dose: 1    . ibuprofen (ADVIL,MOTRIN) 800 MG tablet Take 1 tablet (800 mg total) by mouth every 8 (eight) hours as needed. 30 tablet 0  . metFORMIN (GLUCOPHAGE) 1000 MG tablet Take 1 tablet (1,000 mg total) by mouth 2 (two) times daily with a meal. 180 tablet 0  . prenatal vitamin w/FE, FA (PRENATAL 1 + 1) 27-1 MG TABS tablet Take 1 tablet by mouth daily at 12 noon.    Marland Kitchen HUMALOG 100 UNIT/ML injection Inject 12 Units into the skin daily. 9units-PM  1  . metFORMIN (GLUCOPHAGE-XR) 500 MG 24 hr tablet Take 1 tablet by mouth 4 (four) times daily.   0   No facility-administered medications prior to visit.      ROS:  Review of Systems    Constitutional: Positive for fatigue. Negative for fever and unexpected weight change.  Respiratory: Negative for cough, shortness of breath and wheezing.   Cardiovascular: Negative for chest pain, palpitations and leg swelling.  Gastrointestinal: Negative for blood in stool, constipation, diarrhea, nausea and vomiting.  Endocrine: Negative for cold intolerance, heat intolerance and polyuria.  Genitourinary: Positive for menstrual problem and vaginal pain. Negative for dyspareunia, dysuria, flank pain, frequency, genital sores, hematuria, pelvic pain, urgency, vaginal bleeding and vaginal discharge.  Musculoskeletal: Negative for back pain, joint swelling and myalgias.  Skin: Negative for rash.  Neurological: Negative for dizziness, syncope, light-headedness, numbness and headaches.  Hematological: Negative for adenopathy.  Psychiatric/Behavioral: Negative for agitation, confusion, sleep disturbance and suicidal ideas. The patient is not nervous/anxious.    BREAST: No symptoms   Objective: BP 120/78   Pulse 95   Ht  (1.499 m)   Wt 166 lb (75.3 kg)   LMP 08/28/2017 (Exact Date)   Breastfeeding? No   BMI 33.53 kg/m    Physical Exam  Constitutional: She is oriented to person, place, and time. She appears well-developed and well-nourished.  Genitourinary: Vagina normal and uterus normal.  There is rash and tenderness on the right labia.  There is rash and tenderness on the left labia. No erythema or tenderness in the vagina. No vaginal discharge found. Right adnexum does not display mass and does not display tenderness. Left adnexum does not display mass and does not display tenderness. Cervix does not exhibit motion tenderness or polyp. Uterus is not enlarged or tender.  Genitourinary Comments: BILAT LABIA MAJORA AND MINORA, PERINEAL AREA WITH ERYTHEMA, SWELLING, SKIN BREAKDOWN; FISSURES PERINEAL AREA  Neck: Normal range of motion. No thyromegaly present.  Cardiovascular: Normal  rate, regular rhythm and normal heart sounds.  No murmur heard. Pulmonary/Chest: Effort normal and breath sounds normal. Right breast exhibits no mass, no nipple discharge, no skin change and no tenderness. Left breast exhibits no mass, no nipple discharge, no skin change and no tenderness.  Abdominal: Soft. There is no tenderness. There is no guarding.  Musculoskeletal: Normal range of motion.  Neurological: She is alert and oriented to person, place, and time. No cranial nerve deficit.  Psychiatric: She has a normal mood and affect. Her behavior is normal.  Vitals reviewed.   Results: Results for orders placed or performed in visit on 09/02/17 (from the past 24 hour(s))  POCT Wet Prep with KOH     Status: Normal   Collection Time: 09/02/17  11:57 AM  Result Value Ref Range   Trichomonas, UA Negative    Clue Cells Wet Prep HPF POC NEG    Epithelial Wet Prep HPF POC  Few, Moderate, Many, Too numerous to count   Yeast Wet Prep HPF POC NEG    Bacteria Wet Prep HPF POC  Few   RBC Wet Prep HPF POC     WBC Wet Prep HPF POC     KOH Prep POC Negative Negative    Assessment/Plan: Encounter for annual routine gynecological examination  Cervical cancer screening - Plan: IGP, rfx Aptima HPV ASCU  Candidal vaginitis - Pos exam/neg wet prep. Rx diflucan/lotrisone crm BID for 2 wks. Discussed DM control. Also keep area dry/desitin or A&D as moisture block. F/u prn.  - Plan: POCT Wet Prep with KOH, clotrimazole-betamethasone (LOTRISONE) cream, fluconazole (DIFLUCAN) 150 MG tablet  Encounter for initial prescription of contraceptive pills - OCP start today. Rx lomedia. Condoms for 1 wk. F/u prn.  - Plan: Norethindrone Acetate-Ethinyl Estrad-FE (MICROGESTIN 24 FE) 1-20 MG-MCG(24) tablet  Meds ordered this encounter  Medications  . clotrimazole-betamethasone (LOTRISONE) cream    Sig: Apply 1 application topically 2 (two) times daily. For 2 wks    Dispense:  45 g    Refill:  0    Order Specific  Question:   Supervising Provider    Answer:   Nadara Mustard [id number]  . fluconazole (DIFLUCAN) 150 MG tablet    Sig: Take 1 tablet (150 mg total) by mouth once for 1 dose.    Dispense:  1 tablet    Refill:  0    Order Specific Question:   Supervising Provider    Answer:   Nadara Mustard [id number]  . Norethindrone Acetate-Ethinyl Estrad-FE (MICROGESTIN 24 FE) 1-20 MG-MCG(24) tablet    Sig: Take 1 tablet by mouth daily.    Dispense:  84 tablet    Refill:  3    Order Specific Question:   Supervising Provider    Answer:   Nadara Mustard [[id number]]             GYN counsel family planning choices, adequate intake of calcium and vitamin D, diet and exercise     F/U  Return in about 1 year (around 09/03/2018).  Eric Nees B. Chevelle Coulson, PA-C 09/02/2017 11:59 AM

## 2017-09-04 LAB — IGP, RFX APTIMA HPV ASCU: PAP Smear Comment: 0

## 2018-05-10 IMAGING — CR DG ANKLE COMPLETE 3+V*R*
3 series · 4 of 4 positions shown · non-contrast
Comparison: None.

CLINICAL DATA: Slipped at work last night with lower leg and ankle
pain.

EXAM:
RIGHT ANKLE - COMPLETE 3+ VIEW

[ankle ap]
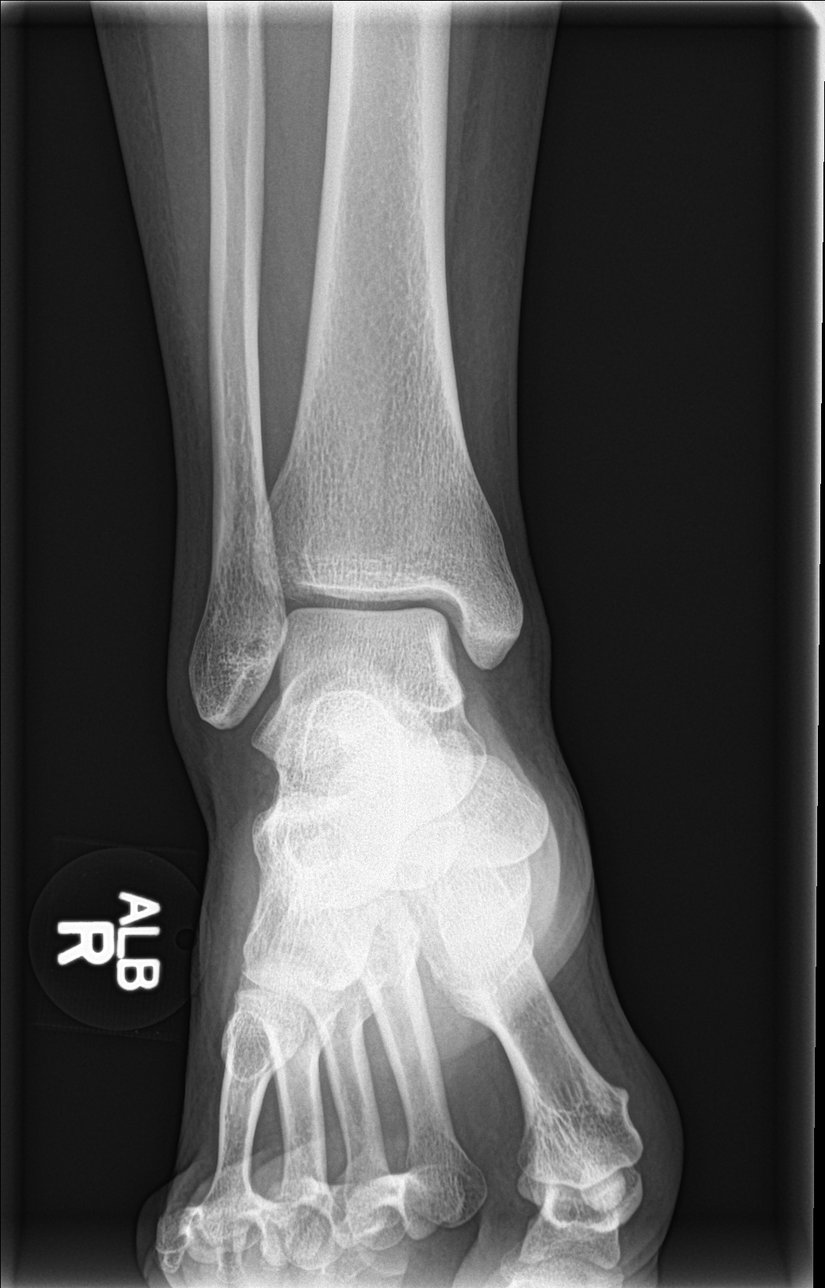

[Series 2: ankle obl · 0.14mm/px · 2 of 2 slices shown]
[im 1/2]
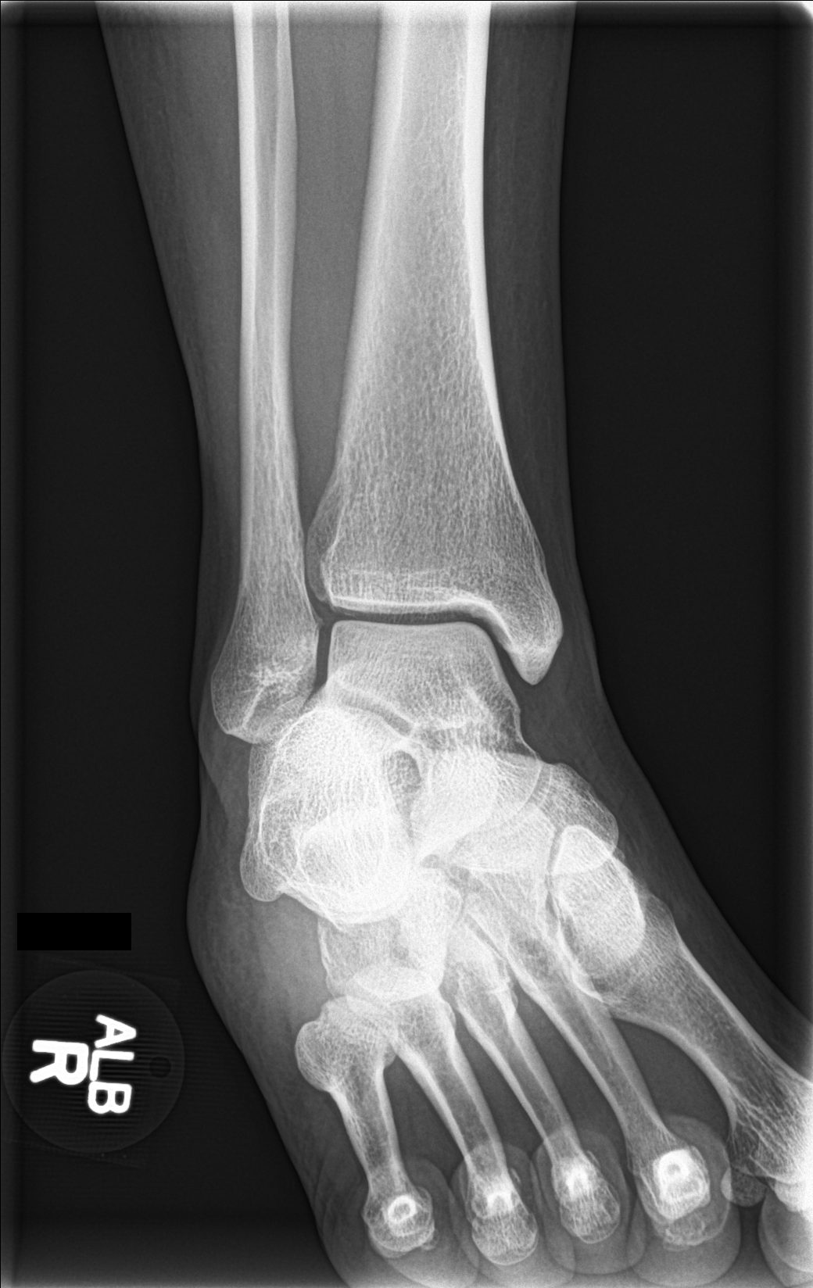
[im 2/2]
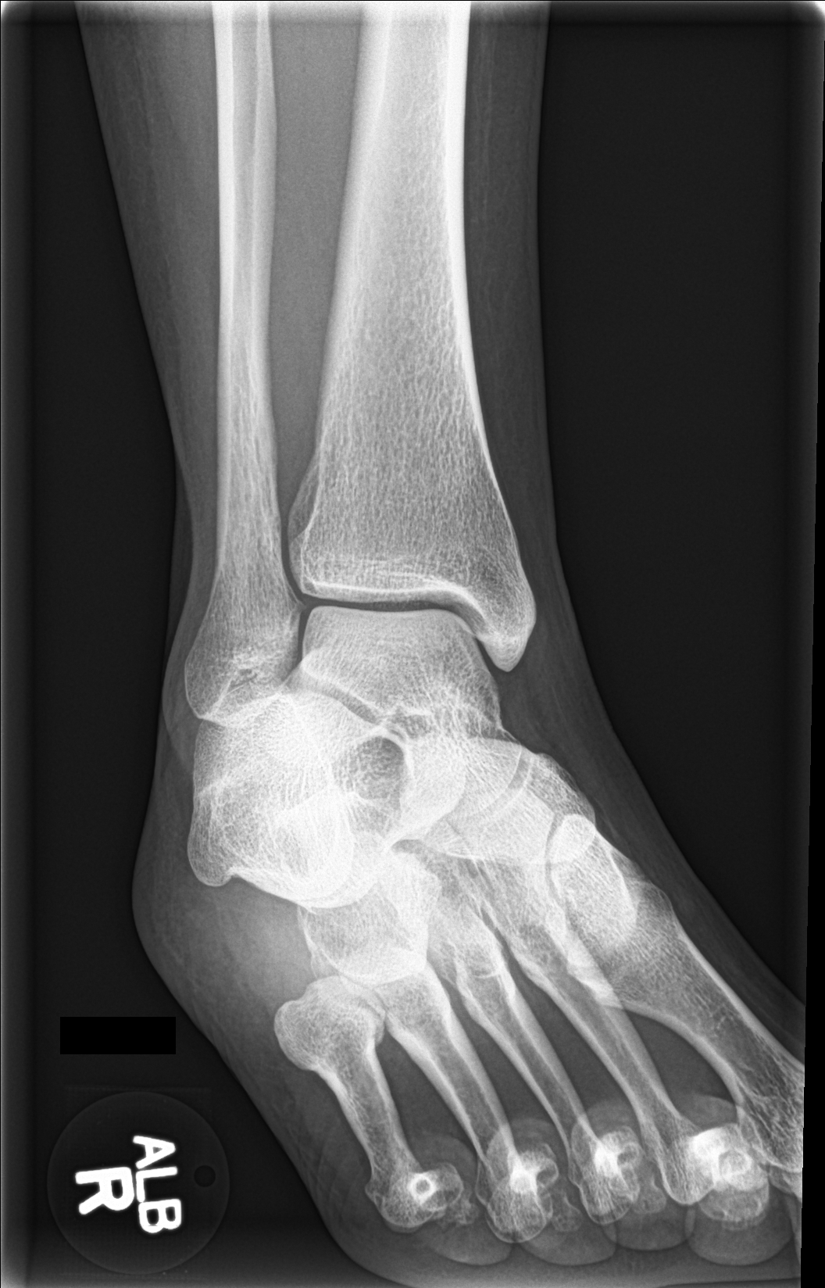

[ankle lat]
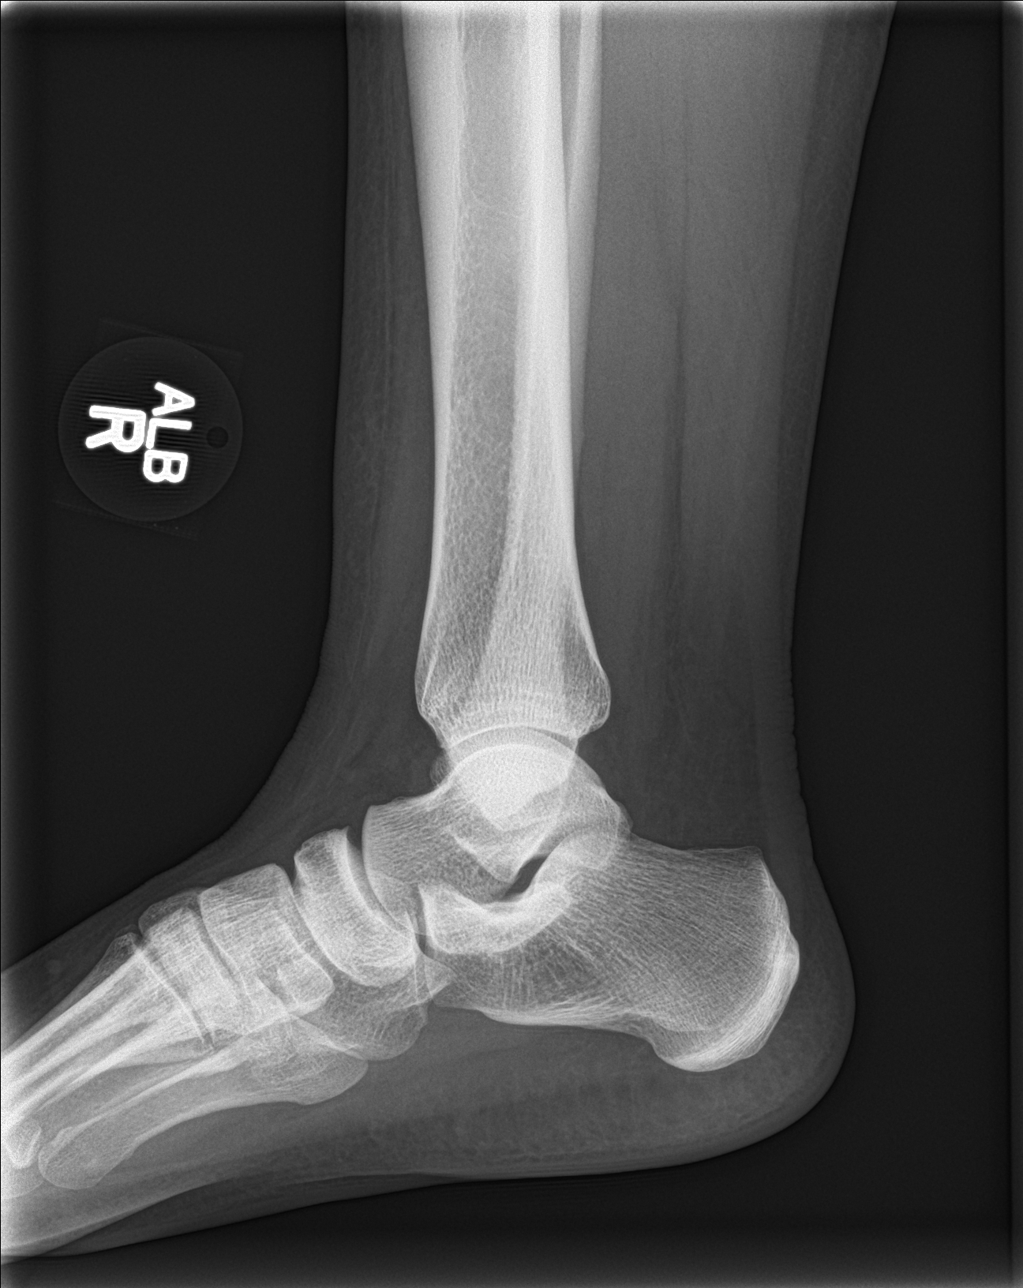

[4 of 4 positions shown; findings below may reference images not displayed]

FINDINGS: There is a joint effusion.  No evidence of fracture or dislocation.
IMPRESSION: Joint effusion.  No bone abnormality.

## 2018-06-14 NOTE — Clinical Note (Incomplete)
New Obstetric Patient H&P    Chief Complaint: "Desires prenatal care"   History of Present Illness: Patient is a 28 y.o. [phone] Hispanic or Latino female, LMP *** presents with amenorrhea and positive home pregnancy test. Based on her  LMP, her EDD is Estimated Date of Delivery: None noted. and her EGA is Unknown. Cycles are {0-35:19561} {days/wks/mos/yrs:310907}, {Desc; regular/irreg:14544}, and occur approximately every : {numbers 22-35:14824} days. Her last pap smear was {numbers (fuzzy):14653} years ago and was {Findings; lab pap smear results:16707::"no abnormalities"}.    She had a urine pregnancy test which was positive {numbers (fuzzy):14653} {time frame:9076}  ago. Her last menstrual period was normal and lasted for  {numbers (fuzzy):14653} {time frame:9076}. Since her LMP she claims she has experienced ***. She denies vaginal bleeding. Her past medical history is {Noncontribuatory/Contributory:21644}. Her prior pregnancies are notable for {pregnancy complications:12320}  Since her LMP, she admits to the use of tobacco products  {yes/no:63} She claims she has gained   {inf wt change:14817} pounds since the start of her pregnancy.  There are cats in the home in the home  {yes/no:63} If yes {Desc; indoor/outdoor:13239} She admits close contact with children on a regular basis  {yes/no:63}  She has had chicken pox in the past {yes/no/unknown:74} She has had Tuberculosis exposures, symptoms, or previously tested positive for TB   {yes/no:63} Current or past history of domestic violence. {yes/no:63}  Genetic Screening/Teratology Counseling: (Includes patient, baby's father, or anyone in either family with:)   1. [facility]  {yes/no:63} 2. Thalassemia ([place], [place], Mediterranean, or Asian background): MCV<80  {yes/no:63} 3. Neural tube defect (meningomyelocele, spina bifida, anencephaly)  {yes/no:63} 4. Congenital heart defect  {yes/no:63}  5. Down syndrome   {yes/no:63} 6. Tay-Sachs (Jewish, [place])  {yes/no:63} 7. Canavan's Disease  {yes/no:63} 8. Sickle cell disease or trait (African)  {yes/no:63}  9. Hemophilia or other blood disorders  {yes/no:63}  10. Muscular dystrophy  {yes/no:63}  11. Cystic fibrosis  {yes/no:63}  12. Huntington's Chorea  {yes/no:63}  13. Mental retardation/autism  {yes/no:63} 14. Other inherited genetic or chromosomal disorder  {yes/no:63} 15. Maternal metabolic disorder (DM, PKU, etc)  {yes/no:63} 16. Patient or FOB with a child with a birth defect not listed above no  16a. Patient or FOB with a birth defect themselves {yes/no:63} 17. Recurrent pregnancy loss, or stillbirth  {yes/no:63}  18. Any medications since LMP other than prenatal vitamins (include vitamins, supplements, OTC meds, drugs, alcohol)  {yes/no:63} 19. Any other genetic/environmental exposure to discuss  {yes/no:63}  Infection History:   1. Lives with someone with TB or TB exposed  {yes/no:63}  2. Patient or partner has history of genital herpes  {yes/no:63} 3. Rash or viral illness since LMP  {yes/no:63} 4. History of STI (GC, CT, HPV, syphilis, HIV)  {yes/no:63} 5. History of recent travel :  {yes/no:63}  Other pertinent information:  {yes/no:63}     Review of Systems:10 point review of systems negative unless otherwise noted in HPI  Past Medical History:  Past Medical History:  Diagnosis Date   Diabetes mellitus without complication (HCC)     Past Surgical History:  Past Surgical History:  Procedure Laterality Date   CESAREAN SECTION  2013    Gynecologic History: No LMP recorded.  Obstetric History: [id number]  Family History:  Family History  Problem Relation Age of Onset   Diabetes Father     Social History:  Social History   Socioeconomic History   Marital status:  Married    Spouse name: Not on file   Number of children: Not on file   Years of education: Not on file   Highest education level: Not  on file  Occupational History   Not on file  Social Needs   Financial resource strain: Not on file   Food insecurity:    Worry: Not on file    Inability: Not on file   Transportation needs:    Medical: Not on file    Non-medical: Not on file  Tobacco Use   Smoking status: Former Smoker   Smokeless tobacco: Never Used  Substance and Sexual Activity   Alcohol use: No   Drug use: No   Sexual activity: Yes    Birth control/protection: None  Lifestyle   Physical activity:    Days per week: Not on file    Minutes per session: Not on file   Stress: Not on file  Relationships   Social connections:    Talks on phone: Not on file    Gets together: Not on file    Attends religious service: Not on file    Active member of club or organization: Not on file    Attends meetings of clubs or organizations: Not on file    Relationship status: Not on file   Intimate partner violence:    Fear of current or ex partner: Not on file    Emotionally abused: Not on file    Physically abused: Not on file    Forced sexual activity: Not on file  Other Topics Concern   Not on file  Social History Narrative   Not on file    Allergies:  No Known Allergies  Medications: Prior to Admission medications   Medication Sig Start Date End Date Taking? Authorizing Provider  clotrimazole-betamethasone (LOTRISONE) cream Apply 1 application topically 2 (two) times daily. For 2 wks 09/02/17   Copland, Alicia B, PA-C  glucose blood (PRECISION QID TEST) test strip Frequency:QID   Dosage:0.0     Instructions:  Note:Dose: 1 07/24/11   [provider]  ibuprofen (ADVIL,MOTRIN) 800 MG tablet Take 1 tablet (800 mg total) by mouth every 8 (eight) hours as needed. 08/13/17   Darci Current, MD  metFORMIN (GLUCOPHAGE) 1000 MG tablet Take 1 tablet (1,000 mg total) by mouth 2 (two) times daily with a meal. 08/13/17 11/11/17  Darci Current, MD  Norethindrone Acetate-Ethinyl Estrad-FE (MICROGESTIN  24 FE) 1-20 MG-MCG(24) tablet Take 1 tablet by mouth daily. 09/02/17   Copland, Ilona Sorrel, PA-C  prenatal vitamin w/FE, FA (PRENATAL 1 + 1) 27-1 MG TABS tablet Take 1 tablet by mouth daily at 12 noon.    [provider]    Physical Exam Vitals: There were no vitals taken for this visit.  General: NAD HEENT: normocephalic, anicteric Thyroid: no enlargement, no palpable nodules Pulmonary: No increased work of breathing, CTAB Cardiovascular: RRR, distal pulses 2+ Abdomen: NABS, soft, non-tender, non-distended.  Umbilicus without lesions.  No hepatomegaly, splenomegaly or masses palpable. No evidence of hernia  Genitourinary:  External: Normal external female genitalia.  Normal urethral meatus, normal  Bartholin's and Skene's glands.    Vagina: Normal vaginal mucosa, no evidence of prolapse.    Cervix: Grossly normal in appearance, no bleeding  Uterus: *** Non-enlarged, mobile, normal contour.  No CMT  Adnexa: ovaries non-enlarged, no adnexal masses  Rectal: deferred Extremities: no edema, erythema, or tenderness Neurologic: Grossly intact Psychiatric: mood appropriate, affect full   Assessment: 28 y.o.  [id number] at Unknown presenting to initiate prenatal care  Plan: 1) Avoid alcoholic beverages. 2) Patient encouraged not to smoke.  3) Discontinue the use of all non-medicinal drugs and chemicals.  4) Take prenatal vitamins daily.  5) Nutrition, food safety (fish, cheese advisories, and high nitrite foods) and exercise discussed. 6) Hospital and practice style discussed with cross coverage system.  7) Genetic Screening, such as with 1st Trimester Screening, cell free fetal DNA, AFP testing, and Ultrasound, as well as with amniocentesis and CVS as appropriate, is discussed with patient. At the conclusion of today's visit patient {Desc; requested/declined/undecided:14580} genetic testing 8) Patient is asked about travel to areas at risk for the Zika virus, and counseled to avoid  travel and exposure to mosquitoes or sexual partners who may have themselves been exposed to the virus. Testing is discussed, and will be ordered as appropriate.

## 2018-06-15 ENCOUNTER — Encounter: Payer: 59 | Admitting: Certified Nurse Midwife

## 2018-07-02 ENCOUNTER — Other Ambulatory Visit: Payer: Self-pay

## 2018-07-02 ENCOUNTER — Encounter: Payer: Self-pay | Admitting: *Deleted

## 2018-07-02 ENCOUNTER — Emergency Department
Admission: EM | Admit: 2018-07-02 | Discharge: 2018-07-03 | Disposition: A | Discharge status: Home / Self Care | Payer: BC Managed Care – PPO | Attending: Emergency Medicine | Admitting: Emergency Medicine

## 2018-07-02 DIAGNOSIS — Z3A09 9 weeks gestation of pregnancy: Secondary | ICD-10-CM | POA: Insufficient documentation

## 2018-07-02 DIAGNOSIS — Z87891 Personal history of nicotine dependence: Secondary | ICD-10-CM | POA: Insufficient documentation

## 2018-07-02 DIAGNOSIS — O210 Mild hyperemesis gravidarum: Secondary | ICD-10-CM | POA: Diagnosis present

## 2018-07-02 LAB — CBC
HCT: 45.2 % (ref 36.0–46.0)
HEMOGLOBIN: 16.1 g/dL — AB (ref 12.0–15.0)
MCH: 31.6 pg (ref 26.0–34.0)
MCHC: 35.6 g/dL (ref 30.0–36.0)
MCV: 88.6 fL (ref 80.0–100.0)
NRBC: 0 % (ref 0.0–0.2)
Platelets: 378 10*3/uL (ref 150–400)
RBC: 5.1 MIL/uL (ref 3.87–5.11)
RDW: 11.9 % (ref 11.5–15.5)
WBC: 10.3 10*3/uL (ref 4.0–10.5)

## 2018-07-02 LAB — COMPREHENSIVE METABOLIC PANEL
ALT: 94 U/L — ABNORMAL HIGH (ref 0–44)
ANION GAP: 14 (ref 5–15)
AST: 45 U/L — ABNORMAL HIGH (ref 15–41)
Albumin: 4.7 g/dL (ref 3.5–5.0)
Alkaline Phosphatase: 80 U/L (ref 38–126)
BUN: 24 mg/dL — ABNORMAL HIGH (ref 6–20)
CO2: 23 mmol/L (ref 22–32)
Calcium: 9.6 mg/dL (ref 8.9–10.3)
Chloride: 100 mmol/L (ref 98–111)
Creatinine, Ser: 0.44 mg/dL (ref 0.44–1.00)
GFR calc non Af Amer: >60 mL/min (ref >60)
Glucose, Bld: 228 mg/dL — ABNORMAL HIGH (ref 70–99)
POTASSIUM: 3.7 mmol/L (ref 3.5–5.1)
Sodium: 137 mmol/L (ref 135–145)
Total Bilirubin: 1.2 mg/dL (ref 0.3–1.2)
Total Protein: 8.1 g/dL (ref 6.5–8.1)

## 2018-07-02 NOTE — ED Triage Notes (Signed)
Pt to ED ED reporting vomiting over the past week that has progressed into vomiting black bile this evening. Pt is [redacted] weeks pregnant and reports for the past 3 days she has been unable to keep food or drink down. Currently [redacted] weeks pregnant. No known complications with this pregnancy. NO vaginal discharge or bleeding.   More than 3 miscarriages in the past. PT tearful in triage.

## 2018-07-03 DIAGNOSIS — O210 Mild hyperemesis gravidarum: Secondary | ICD-10-CM | POA: Diagnosis not present

## 2018-07-03 LAB — HCG, QUANTITATIVE, PREGNANCY: hCG, Beta Chain, Quant, S: 69118 m[IU]/mL — ABNORMAL HIGH (ref <5)

## 2018-07-03 LAB — TYPE AND SCREEN
ABO/RH(D): O POS
Antibody Screen: NEGATIVE

## 2018-07-03 MED ORDER — SODIUM CHLORIDE 0.9 % IV BOLUS
1000.0000 mL | Freq: Once | INTRAVENOUS | Status: AC
  Administered 2018-07-03: 1000 mL via INTRAVENOUS

## 2018-07-03 MED ORDER — METOCLOPRAMIDE HCL 10 MG PO TABS: 10.0000 mg | ORAL_TABLET | Freq: Three times a day (TID) | ORAL | 0 refills | Status: DC

## 2018-07-03 MED ORDER — METOCLOPRAMIDE HCL 5 MG/ML IJ SOLN
10.0000 mg | Freq: Once | INTRAMUSCULAR | Status: AC
  Administered 2018-07-03: 10 mg via INTRAVENOUS
  Filled 2018-07-03: qty 2

## 2018-07-03 NOTE — ED Provider Notes (Signed)
[facility] Emergency Department Provider Note    First MD Initiated Contact with Patient 07/02/18 2339     (approximate)  I have reviewed the triage vital signs and the nursing notes.   HISTORY  Chief Complaint Emesis During Pregnancy    HPI Madison Harrell is a 28 y.o. female G 10 P18 previous miscarriages presents to the emergency department with nausea and vomiting with very poor p.o. intake as a result over the past 3 days.  Patient states that she is unable to keep down any food or drink.  Patient denies any abdominal or pelvic pain.  Patient denies any vaginal bleeding.  Patient states that she was recently seen and evaluated with a ultrasound performed which revealed a intrauterine pregnancy.       Past Medical History:  Diagnosis Date  . Diabetes mellitus without complication (HCC)     There are no active problems to display for this patient.   Past Surgical History:  Procedure Laterality Date  . CESAREAN SECTION  2013    Prior to Admission medications   Medication Sig Start Date End Date Taking? Authorizing Provider  clotrimazole-betamethasone (LOTRISONE) cream Apply 1 application topically 2 (two) times daily. For 2 wks 09/02/17   Copland, Alicia B, PA-C  glucose blood (PRECISION QID TEST) test strip Frequency:QID   Dosage:0.0     Instructions:  Note:Dose: 1 07/24/11   [provider]  ibuprofen (ADVIL,MOTRIN) 800 MG tablet Take 1 tablet (800 mg total) by mouth every 8 (eight) hours as needed. 08/13/17   Darci Current, MD  metFORMIN (GLUCOPHAGE) 1000 MG tablet Take 1 tablet (1,000 mg total) by mouth 2 (two) times daily with a meal. 08/13/17 11/11/17  Darci Current, MD  metoCLOPramide (REGLAN) 10 MG tablet Take 1 tablet (10 mg total) by mouth 3 (three) times daily with meals for 30 days. 07/03/18 08/02/18  Darci Current, MD  Norethindrone Acetate-Ethinyl Estrad-FE (MICROGESTIN 24 FE) 1-20 MG-MCG(24) tablet Take 1 tablet by mouth  daily. 09/02/17   Copland, Ilona Sorrel, PA-C  prenatal vitamin w/FE, FA (PRENATAL 1 + 1) 27-1 MG TABS tablet Take 1 tablet by mouth daily at 12 noon.    [provider]    Allergies Patient has no known allergies.  Family History  Problem Relation Age of Onset  . Diabetes Father     Social History Social History   Tobacco Use  . Smoking status: Former Games developer  . Smokeless tobacco: Never Used  Substance Use Topics  . Alcohol use: No  . Drug use: No    Review of Systems Constitutional: No fever/chills Eyes: No visual changes. ENT: No sore throat. Cardiovascular: Denies chest pain. Respiratory: Denies shortness of breath. Gastrointestinal: No abdominal pain.  Positive for nausea and vomiting no diarrhea.  No constipation. Genitourinary: Negative for dysuria. Musculoskeletal: Negative for neck pain.  Negative for back pain. Integumentary: Negative for rash. Neurological: Negative for headaches, focal weakness or numbness.  ____________________________________________   PHYSICAL EXAM:  VITAL SIGNS: ED Triage Vitals  Enc Vitals Group     BP 07/02/18 2256 135/81     Pulse Rate 07/02/18 2256 91     Resp 07/02/18 2256 20     Temp 07/02/18 2256 97.8 F (36.6 C)     Temp Source 07/02/18 2256 Oral     SpO2 07/02/18 2256 100 %     Weight 07/02/18 2256 74.8 kg (165 lb)     Height 07/02/18 2256 1.499 m (4'  11")     Head Circumference --      Peak Flow --      Pain Score 07/02/18 2301 0     Pain Loc --      Pain Edu? --      Excl. in GC? --      Constitutional: Alert and oriented. Well appearing and in no acute distress. Eyes: Conjunctivae are normal. Mouth/Throat: Mucous membranes are moist.  Oropharynx non-erythematous. Neck: No stridor.  Cardiovascular: Normal rate, regular rhythm. Good peripheral circulation. Grossly normal heart sounds. Respiratory: Normal respiratory effort.  No retractions. Lungs CTAB. Gastrointestinal: Soft and nontender. No distention.    Musculoskeletal: No lower extremity tenderness nor edema. No gross deformities of extremities. Neurologic:  Normal speech and language. No gross focal neurologic deficits are appreciated.  Skin:  Skin is warm, dry and intact. No rash noted. Psychiatric: Mood and affect are normal. Speech and behavior are normal.  ____________________________________________   LABS (all labs ordered are listed, but only abnormal results are displayed)  Labs Reviewed  COMPREHENSIVE METABOLIC PANEL - Abnormal; Notable for the following components:      Result Value   Glucose, Bld 228 (*)    BUN 24 (*)    AST 45 (*)    ALT 94 (*)    All other components within normal limits  CBC - Abnormal; Notable for the following components:   Hemoglobin 16.1 (*)    All other components within normal limits  HCG, QUANTITATIVE, PREGNANCY - Abnormal; Notable for the following components:   hCG, Beta Chain, Quant, S 69,118 (*)    All other components within normal limits  URINALYSIS, COMPLETE (UACMP) WITH MICROSCOPIC  TYPE AND SCREEN   ________________________________________ Procedures   ____________________________________________   INITIAL IMPRESSION / MDM / ASSESSMENT AND PLAN / ED COURSE  As part of my medical decision making, I reviewed the following data within the electronic MEDICAL RECORD NUMBER   28 year old female presented with above-stated history and physical exam secondary to nausea and vomiting with poor p.o. intake.  Consistent with hyperemesis gravidarum.  Patient received 2 L IV normal saline and 10 mg of IV Reglan no further vomiting while in the emergency department.  Patient able to tolerate both eating and drinking.  Patient be prescribed Reglan for home with recommendation to follow-up with OB/GYN ____________________________________________  FINAL CLINICAL IMPRESSION(S) / ED DIAGNOSES  Final diagnoses:  Hyperemesis gravidarum     MEDICATIONS GIVEN DURING THIS VISIT:  Medications   sodium chloride 0.9 % bolus 1,000 mL (0 mLs Intravenous Stopped 07/03/18 0159)  sodium chloride 0.9 % bolus 1,000 mL (1,000 mLs Intravenous New Bag/Given 07/03/18 0159)  metoCLOPramide (REGLAN) injection 10 mg (10 mg Intravenous Given 07/03/18 0057)     ED Discharge Orders         Ordered    metoCLOPramide (REGLAN) 10 MG tablet  3 times daily with meals     07/03/18 0348           Note:  This document was prepared using Dragon voice recognition software and may include unintentional dictation errors.   Darci Current, MD 07/03/18 [phone]

## 2018-07-03 NOTE — ED Provider Notes (Signed)
[facility] Emergency Department Provider Note   First MD Initiated Contact with Patient 07/02/18 2339     (approximate)  I have reviewed the triage vital signs and the nursing notes.   HISTORY  Chief Complaint Emesis During Pregnancy   HPI Madison Harrell is a 28 y.o. female G 10 P1 a previous miscarriage presents to the emergency department approximately [redacted] weeks pregnant with nausea vomiting with inability to tolerate p.o. for the past 3 days.  Patient denies any abdominal pain no pelvic pain.  Patient denies any vaginal bleeding       Past Medical History:  Diagnosis Date  . Diabetes mellitus without complication (HCC)     There are no active problems to display for this patient.   Past Surgical History:  Procedure Laterality Date  . CESAREAN SECTION  2013    Prior to Admission medications   Medication Sig Start Date End Date Taking? Authorizing Provider  clotrimazole-betamethasone (LOTRISONE) cream Apply 1 application topically 2 (two) times daily. For 2 wks 09/02/17   Copland, Alicia B, PA-C  glucose blood (PRECISION QID TEST) test strip Frequency:QID   Dosage:0.0     Instructions:  Note:Dose: 1 07/24/11   [provider]  ibuprofen (ADVIL,MOTRIN) 800 MG tablet Take 1 tablet (800 mg total) by mouth every 8 (eight) hours as needed. 08/13/17   Darci Current, MD  metFORMIN (GLUCOPHAGE) 1000 MG tablet Take 1 tablet (1,000 mg total) by mouth 2 (two) times daily with a meal. 08/13/17 11/11/17  Darci Current, MD  Norethindrone Acetate-Ethinyl Estrad-FE (MICROGESTIN 24 FE) 1-20 MG-MCG(24) tablet Take 1 tablet by mouth daily. 09/02/17   Copland, Ilona Sorrel, PA-C  prenatal vitamin w/FE, FA (PRENATAL 1 + 1) 27-1 MG TABS tablet Take 1 tablet by mouth daily at 12 noon.    [provider]    Allergies Patient has no known allergies.  Family History  Problem Relation Age of Onset  . Diabetes Father     Social History Social History     Tobacco Use  . Smoking status: Former Games developer  . Smokeless tobacco: Never Used  Substance Use Topics  . Alcohol use: No  . Drug use: No    Review of Systems Constitutional: No fever/chills Eyes: No visual changes. ENT: No sore throat. Cardiovascular: Denies chest pain. Respiratory: Denies shortness of breath. Gastrointestinal: No abdominal pain.  Positive for vomiting.  No diarrhea.  No constipation. Genitourinary: Negative for dysuria. Musculoskeletal: Negative for neck pain.  Negative for back pain. Integumentary: Negative for rash. Neurological: Negative for headaches, focal weakness or numbness.   ____________________________________________   PHYSICAL EXAM:  VITAL SIGNS: ED Triage Vitals  Enc Vitals Group     BP 07/02/18 2256 135/81     Pulse Rate 07/02/18 2256 91     Resp 07/02/18 2256 20     Temp 07/02/18 2256 97.8 F (36.6 C)     Temp Source 07/02/18 2256 Oral     SpO2 07/02/18 2256 100 %     Weight 07/02/18 2256 74.8 kg (165 lb)     Height 07/02/18 2256 1.499 m (4\' 11" )     Head Circumference --      Peak Flow --      Pain Score 07/02/18 2301 0     Pain Loc --      Pain Edu? --      Excl. in GC? --      Constitutional: Alert and oriented. Well appearing and  in no acute distress. Eyes: Conjunctivae are normal.  Mouth/Throat: Mucous membranes are dry. Oropharynx non-erythematous. Neck: No stridor. Cardiovascular: Normal rate, regular rhythm. Good peripheral circulation. Grossly normal heart sounds. Respiratory: Normal respiratory effort.  No retractions. Lungs CTAB. Gastrointestinal: Soft and nontender. No distention.   Musculoskeletal: No lower extremity tenderness nor edema. No gross deformities of extremities. Neurologic:  Normal speech and language. No gross focal neurologic deficits are appreciated.  Skin:  Skin is warm, dry and intact. No rash noted. Psychiatric: Mood and affect are normal. Speech and behavior are  normal.  ____________________________________________   LABS (all labs ordered are listed, but only abnormal results are displayed)  Labs Reviewed  COMPREHENSIVE METABOLIC PANEL - Abnormal; Notable for the following components:      Result Value   Glucose, Bld 228 (*)    BUN 24 (*)    AST 45 (*)    ALT 94 (*)    All other components within normal limits  CBC - Abnormal; Notable for the following components:   Hemoglobin 16.1 (*)    All other components within normal limits  HCG, QUANTITATIVE, PREGNANCY - Abnormal; Notable for the following components:   hCG, Beta Chain, Quant, S 69,118 (*)    All other components within normal limits  TYPE AND SCREEN        Procedures   ____________________________________________   INITIAL IMPRESSION / MDM / ASSESSMENT AND PLAN / ED COURSE  As part of my medical decision making, I reviewed the following data within the electronic MEDICAL RECORD NUMBER    ____________________________________________  FINAL CLINICAL IMPRESSION(S) / ED DIAGNOSES  Final diagnoses:  Hyperemesis gravidarum     MEDICATIONS GIVEN DURING THIS VISIT:  Medications  sodium chloride 0.9 % bolus 1,000 mL (has no administration in time range)  sodium chloride 0.9 % bolus 1,000 mL (has no administration in time range)     ED Discharge Orders    None       Note:  This document was prepared using Dragon voice recognition software and may include unintentional dictation errors.   Darci Current, MD 07/03/18 [phone]

## 2018-07-03 NOTE — ED Notes (Signed)
Meal tray given by this tech per physicians order.

## 2018-08-01 ENCOUNTER — Emergency Department: Payer: BC Managed Care – PPO

## 2018-08-01 ENCOUNTER — Observation Stay
Admission: EM | Admit: 2018-08-01 | Discharge: 2018-08-03 | Disposition: A | Discharge status: Home / Self Care | Payer: BC Managed Care – PPO | Attending: Obstetrics and Gynecology | Admitting: Obstetrics and Gynecology

## 2018-08-01 ENCOUNTER — Other Ambulatory Visit: Payer: Self-pay

## 2018-08-01 DIAGNOSIS — Z87891 Personal history of nicotine dependence: Secondary | ICD-10-CM | POA: Insufficient documentation

## 2018-08-01 DIAGNOSIS — O210 Mild hyperemesis gravidarum: Secondary | ICD-10-CM

## 2018-08-01 DIAGNOSIS — O24911 Unspecified diabetes mellitus in pregnancy, first trimester: Secondary | ICD-10-CM | POA: Insufficient documentation

## 2018-08-01 DIAGNOSIS — Z3A13 13 weeks gestation of pregnancy: Secondary | ICD-10-CM | POA: Diagnosis not present

## 2018-08-01 DIAGNOSIS — Z833 Family history of diabetes mellitus: Secondary | ICD-10-CM | POA: Insufficient documentation

## 2018-08-01 DIAGNOSIS — O99281 Endocrine, nutritional and metabolic diseases complicating pregnancy, first trimester: Secondary | ICD-10-CM | POA: Diagnosis not present

## 2018-08-01 DIAGNOSIS — R55 Syncope and collapse: Secondary | ICD-10-CM | POA: Diagnosis present

## 2018-08-01 DIAGNOSIS — O24919 Unspecified diabetes mellitus in pregnancy, unspecified trimester: Secondary | ICD-10-CM | POA: Diagnosis present

## 2018-08-01 DIAGNOSIS — E1165 Type 2 diabetes mellitus with hyperglycemia: Secondary | ICD-10-CM | POA: Diagnosis not present

## 2018-08-01 DIAGNOSIS — Z79899 Other long term (current) drug therapy: Secondary | ICD-10-CM | POA: Diagnosis not present

## 2018-08-01 DIAGNOSIS — E860 Dehydration: Secondary | ICD-10-CM | POA: Diagnosis not present

## 2018-08-01 DIAGNOSIS — Z794 Long term (current) use of insulin: Secondary | ICD-10-CM | POA: Diagnosis not present

## 2018-08-01 DIAGNOSIS — O24111 Pre-existing diabetes mellitus, type 2, in pregnancy, first trimester: Secondary | ICD-10-CM

## 2018-08-01 LAB — BASIC METABOLIC PANEL
Anion gap: 15 (ref 5–15)
Anion gap: 9 (ref 5–15)
Anion gap: 6 (ref 5–15)
BUN: 21 mg/dL — ABNORMAL HIGH (ref 6–20)
BUN: 19 mg/dL (ref 6–20)
BUN: 12 mg/dL (ref 6–20)
CO2: 20 mmol/L — ABNORMAL LOW (ref 22–32)
CO2: 23 mmol/L (ref 22–32)
CO2: 21 mmol/L — ABNORMAL LOW (ref 22–32)
Calcium: 9.1 mg/dL (ref 8.9–10.3)
Calcium: 8.1 mg/dL — ABNORMAL LOW (ref 8.9–10.3)
Calcium: 7.9 mg/dL — ABNORMAL LOW (ref 8.9–10.3)
Chloride: 99 mmol/L (ref 98–111)
Chloride: 104 mmol/L (ref 98–111)
Chloride: 106 mmol/L (ref 98–111)
Creatinine, Ser: 0.33 mg/dL — ABNORMAL LOW (ref 0.44–1.00)
Creatinine, Ser: <0.3 mg/dL — ABNORMAL LOW (ref 0.44–1.00)
Creatinine, Ser: <0.3 mg/dL — ABNORMAL LOW (ref 0.44–1.00)
GFR calc Af Amer: >60 mL/min (ref >60)
GFR calc non Af Amer: >60 mL/min (ref >60)
Glucose, Bld: 223 mg/dL — ABNORMAL HIGH (ref 70–99)
Glucose, Bld: 179 mg/dL — ABNORMAL HIGH (ref 70–99)
Glucose, Bld: 231 mg/dL — ABNORMAL HIGH (ref 70–99)
Potassium: 3.4 mmol/L — ABNORMAL LOW (ref 3.5–5.1)
Potassium: 3.2 mmol/L — ABNORMAL LOW (ref 3.5–5.1)
Potassium: 3.7 mmol/L (ref 3.5–5.1)
Sodium: 134 mmol/L — ABNORMAL LOW (ref 135–145)
Sodium: 136 mmol/L (ref 135–145)
Sodium: 133 mmol/L — ABNORMAL LOW (ref 135–145)

## 2018-08-01 LAB — TSH: TSH: 2.977 u[IU]/mL (ref 0.350–4.500)

## 2018-08-01 LAB — BLOOD GAS, VENOUS
Acid-Base Excess: 0.1 mmol/L (ref 0.0–2.0)
Bicarbonate: 24.7 mmol/L (ref 20.0–28.0)
O2 Saturation: 68 %
Patient temperature: 37
pCO2, Ven: 39 mmHg — ABNORMAL LOW (ref 44.0–60.0)
pH, Ven: 7.41 (ref 7.250–7.430)
pO2, Ven: 35 mmHg (ref 32.0–45.0)

## 2018-08-01 LAB — CBC
HCT: 42.4 % (ref 36.0–46.0)
Hemoglobin: 15.4 g/dL — ABNORMAL HIGH (ref 12.0–15.0)
MCH: 32.2 pg (ref 26.0–34.0)
MCHC: 36.3 g/dL — ABNORMAL HIGH (ref 30.0–36.0)
MCV: 88.5 fL (ref 80.0–100.0)
Platelets: 408 10*3/uL — ABNORMAL HIGH (ref 150–400)
RBC: 4.79 MIL/uL (ref 3.87–5.11)
RDW: 11.9 % (ref 11.5–15.5)
WBC: 10.9 10*3/uL — ABNORMAL HIGH (ref 4.0–10.5)
nRBC: 0 % (ref 0.0–0.2)

## 2018-08-01 LAB — URINALYSIS, COMPLETE (UACMP) WITH MICROSCOPIC
Bilirubin Urine: NEGATIVE
Glucose, UA: >=500 mg/dL — AB
Hgb urine dipstick: NEGATIVE
Ketones, ur: 80 mg/dL — AB
Leukocytes,Ua: NEGATIVE
Nitrite: NEGATIVE
Protein, ur: 100 mg/dL — AB
Specific Gravity, Urine: 1.042 — ABNORMAL HIGH (ref 1.005–1.030)
pH: 6 (ref 5.0–8.0)

## 2018-08-01 LAB — GLUCOSE, CAPILLARY
Glucose-Capillary: 216 mg/dL — ABNORMAL HIGH (ref 70–99)
Glucose-Capillary: 183 mg/dL — ABNORMAL HIGH (ref 70–99)
Glucose-Capillary: 174 mg/dL — ABNORMAL HIGH (ref 70–99)
Glucose-Capillary: 134 mg/dL — ABNORMAL HIGH (ref 70–99)
Glucose-Capillary: 143 mg/dL — ABNORMAL HIGH (ref 70–99)
Glucose-Capillary: 142 mg/dL — ABNORMAL HIGH (ref 70–99)
Glucose-Capillary: 154 mg/dL — ABNORMAL HIGH (ref 70–99)

## 2018-08-01 LAB — MAGNESIUM
Magnesium: 1.9 mg/dL (ref 1.7–2.4)
Magnesium: 1.8 mg/dL (ref 1.7–2.4)

## 2018-08-01 LAB — HEMOGLOBIN A1C
Hgb A1c MFr Bld: 7.2 % — ABNORMAL HIGH (ref 4.8–5.6)
Mean Plasma Glucose: 159.94 mg/dL

## 2018-08-01 LAB — T4, FREE: Free T4: 0.84 ng/dL (ref 0.82–1.77)

## 2018-08-01 MED ORDER — INSULIN GLARGINE 100 UNIT/ML ~~LOC~~ SOLN
14.0000 [IU] | Freq: Every day | SUBCUTANEOUS | Status: DC
  Administered 2018-08-01 – 2018-08-02 (×2): 14 [IU] via SUBCUTANEOUS
  Filled 2018-08-01 (×3): qty 0.14

## 2018-08-01 MED ORDER — ONDANSETRON HCL 4 MG/2ML IJ SOLN
4.0000 mg | Freq: Four times a day (QID) | INTRAMUSCULAR | Status: DC | PRN
  Administered 2018-08-01: 4 mg via INTRAVENOUS
  Filled 2018-08-01: qty 2

## 2018-08-01 MED ORDER — METOCLOPRAMIDE HCL 5 MG/ML IJ SOLN
10.0000 mg | Freq: Once | INTRAMUSCULAR | Status: AC
  Administered 2018-08-01: 10 mg via INTRAVENOUS
  Filled 2018-08-01: qty 2

## 2018-08-01 MED ORDER — INSULIN ASPART 100 UNIT/ML ~~LOC~~ SOLN: 0.0000 [IU] | Freq: Three times a day (TID) | SUBCUTANEOUS | Status: DC

## 2018-08-01 MED ORDER — DIPHENHYDRAMINE HCL 50 MG/ML IJ SOLN
12.5000 mg | Freq: Once | INTRAMUSCULAR | Status: AC
  Administered 2018-08-01: 12.5 mg via INTRAVENOUS
  Filled 2018-08-01: qty 1

## 2018-08-01 MED ORDER — FAMOTIDINE 20 MG PO TABS
20.0000 mg | ORAL_TABLET | Freq: Once | ORAL | Status: AC
  Administered 2018-08-01: 20 mg via ORAL
  Filled 2018-08-01: qty 1

## 2018-08-01 MED ORDER — ACETAMINOPHEN 325 MG PO TABS: 650.0000 mg | ORAL_TABLET | ORAL | Status: DC | PRN

## 2018-08-01 MED ORDER — SODIUM CHLORIDE 0.9 % IV SOLN
INTRAVENOUS | Status: DC
  Administered 2018-08-01: 10:00:00 via INTRAVENOUS

## 2018-08-01 MED ORDER — M.V.I. ADULT IV INJ
INTRAVENOUS | Status: DC
  Administered 2018-08-01: 11:00:00 via INTRAVENOUS
  Filled 2018-08-01: qty 10

## 2018-08-01 MED ORDER — ONDANSETRON HCL 4 MG/2ML IJ SOLN
4.0000 mg | Freq: Once | INTRAMUSCULAR | Status: AC
  Administered 2018-08-01: 4 mg via INTRAVENOUS
  Filled 2018-08-01: qty 2

## 2018-08-01 MED ORDER — MENTHOL 3 MG MT LOZG
1.0000 | LOZENGE | OROMUCOSAL | Status: DC | PRN
  Filled 2018-08-01: qty 9

## 2018-08-01 MED ORDER — INSULIN ASPART 100 UNIT/ML ~~LOC~~ SOLN
0.0000 [IU] | Freq: Three times a day (TID) | SUBCUTANEOUS | Status: DC
  Administered 2018-08-01: 3 [IU] via SUBCUTANEOUS
  Administered 2018-08-01: 4 [IU] via SUBCUTANEOUS
  Administered 2018-08-02: 3 [IU] via SUBCUTANEOUS
  Filled 2018-08-01 (×4): qty 1

## 2018-08-01 MED ORDER — METOCLOPRAMIDE HCL 5 MG/ML IJ SOLN
10.0000 mg | Freq: Three times a day (TID) | INTRAMUSCULAR | Status: DC
  Administered 2018-08-01 – 2018-08-02 (×3): 10 mg via INTRAVENOUS
  Filled 2018-08-01 (×3): qty 2

## 2018-08-01 MED ORDER — POTASSIUM CHLORIDE IN NACL 20-0.45 MEQ/L-% IV SOLN
INTRAVENOUS | Status: DC
  Administered 2018-08-01 – 2018-08-02 (×3): via INTRAVENOUS
  Filled 2018-08-01 (×5): qty 1000

## 2018-08-01 MED ORDER — SODIUM CHLORIDE 0.9 % IV BOLUS
1500.0000 mL | Freq: Once | INTRAVENOUS | Status: AC
  Administered 2018-08-01: 1500 mL via INTRAVENOUS

## 2018-08-01 NOTE — H&P (Signed)
Obstetrics & Gynecology Consult H&P   Consulting Department: Emergency Department  Consulting Physician: Sharyn Creamer, MD  Consulting Question: Hyperemesis    History of Present Illness: Patient is a 28 y.o. G7P1051 at [redacted]w[redacted]d by 7 week 5 day [place] at [facility] on 06/24/2018 giving an EDD of 02/05/2019 presenting to emergency room with hyperemesis, dehydration, and two syncopal episodes in setting of DMII. The patient is currently being managed with 28U of long acting insulin (lantus) at bedtime, and 10 units with meals although she has not been taking her insulin secondary to nausea.  Also taking po metformin.  She reports fasting sugars in the 110 and postprandials prior to this current episode of emesis in the 190's.  She reports complete po intolerance over the past 3 days.  Was seen about a month ago in ED and started on reglan po.  Had two syncopal episodes in bed where husband had difficulty arousing her.  No seizure like activity.  BG was not checked at that time.  On presentation to the ER at 2300 her BG was 228.  The patient has not established care as of yet but has an appointment with [facility] on 08/11/2018.  Prior pregnancy notable for preeclampsia, cesarean section for fetal intolerance at [facility].    Review of Systems:10 point review of systems  Past Medical History:  Past Medical History:  Diagnosis Date   Diabetes mellitus without complication (HCC)     Past Surgical History:  Past Surgical History:  Procedure Laterality Date   CESAREAN SECTION  2013    Gynecologic History:   Obstetric History: [id number]  Family History:  Family History  Problem Relation Age of Onset   Diabetes Father     Social History:  Social History   Socioeconomic History   Marital status: Married    Spouse name: Not on file   Number of children: Not on file   Years of education: Not on file   Highest education level: Not on file  Occupational History   Not on file  Social Needs   Financial  resource strain: Not on file   Food insecurity:    Worry: Not on file    Inability: Not on file   Transportation needs:    Medical: Not on file    Non-medical: Not on file  Tobacco Use   Smoking status: Former Smoker   Smokeless tobacco: Never Used  Substance and Sexual Activity   Alcohol use: No   Drug use: No   Sexual activity: Yes    Birth control/protection: None  Lifestyle   Physical activity:    Days per week: Not on file    Minutes per session: Not on file   Stress: Not on file  Relationships   Social connections:    Talks on phone: Not on file    Gets together: Not on file    Attends religious service: Not on file    Active member of club or organization: Not on file    Attends meetings of clubs or organizations: Not on file    Relationship status: Not on file   Intimate partner violence:    Fear of current or ex partner: Not on file    Emotionally abused: Not on file    Physically abused: Not on file    Forced sexual activity: Not on file  Other Topics Concern   Not on file  Social History Narrative   Not on file    Allergies:  No Known  Allergies  Medications: Prior to Admission medications   Medication Sig Start Date End Date Taking? Authorizing Provider  clotrimazole-betamethasone (LOTRISONE) cream Apply 1 application topically 2 (two) times daily. For 2 wks 09/02/17   Copland, Alicia B, PA-C  glucose blood (PRECISION QID TEST) test strip Frequency:QID   Dosage:0.0     Instructions:  Note:Dose: 1 07/24/11   [provider]  metFORMIN (GLUCOPHAGE) 1000 MG tablet Take 1 tablet (1,000 mg total) by mouth 2 (two) times daily with a meal. 08/13/17 11/11/17  Darci Current, MD  metoCLOPramide (REGLAN) 10 MG tablet Take 1 tablet (10 mg total) by mouth 3 (three) times daily with meals for 30 days. 07/03/18 08/02/18  Darci Current, MD  prenatal vitamin w/FE, FA (PRENATAL 1 + 1) 27-1 MG TABS tablet Take 1 tablet by mouth daily at 12 noon.     [provider]    Physical Exam Vitals: Blood pressure (!) 133/94, pulse 91, temperature 98.1 F (36.7 C), temperature source Oral, resp. rate 18, last menstrual period 08/28/2017, SpO2 98 %. There is no height or weight on file to calculate BMI.  General: NAD, appears stated age HEENT: normocephalic, anicteric Pulmonary: No increased work of breathing Genitourinary: Deferred Extremities: no edema, erythema, or tenderness Neurologic: Grossly intact Psychiatric: mood appropriate, affect full  Labs: Results for orders placed or performed during the hospital encounter of 08/01/18 (from the past 72 hour(s))  CBC     Status: Abnormal   Collection Time: 08/01/18  3:03 AM  Result Value Ref Range   WBC 10.9 (H) 4.0 - 10.5 K/uL   RBC 4.79 3.87 - 5.11 MIL/uL   Hemoglobin 15.4 (H) 12.0 - 15.0 g/dL   HCT 65.0 35.4 - 65.6 %   MCV 88.5 80.0 - 100.0 fL   MCH 32.2 26.0 - 34.0 pg   MCHC 36.3 (H) 30.0 - 36.0 g/dL   RDW 81.2 75.1 - 70.0 %   Platelets 408 (H) 150 - 400 K/uL   nRBC 0.0 0.0 - 0.2 %    Comment: Performed at [facility], [address] [place], [place] [address]  Basic metabolic panel     Status: Abnormal   Collection Time: 08/01/18  3:03 AM  Result Value Ref Range   Sodium 134 (L) 135 - 145 mmol/L   Potassium 3.4 (L) 3.5 - 5.1 mmol/L    Comment: HEMOLYSIS AT THIS LEVEL MAY AFFECT RESULT   Chloride 99 98 - 111 mmol/L   CO2 20 (L) 22 - 32 mmol/L   Glucose, Bld 223 (H) 70 - 99 mg/dL   BUN 21 (H) 6 - 20 mg/dL   Creatinine, Ser 4.96 (L) 0.44 - 1.00 mg/dL   Calcium 9.1 8.9 - 75.9 mg/dL   GFR calc non Af Amer >60 >60 mL/min   GFR calc Af Amer >60 >60 mL/min   Anion gap 15 5 - 15    Comment: Performed at [facility], [address] [place], [place] [address]  Urinalysis, Complete w Microscopic     Status: Abnormal   Collection Time: 08/01/18  3:03 AM  Result Value Ref Range   Color, Urine AMBER (A) YELLOW    Comment: BIOCHEMICALS MAY BE AFFECTED  BY COLOR   APPearance CLOUDY (A) CLEAR   Specific Gravity, Urine 1.042 (H) 1.005 - 1.030   pH 6.0 5.0 - 8.0   Glucose, UA >=500 (A) NEGATIVE mg/dL   Hgb urine dipstick NEGATIVE NEGATIVE   Bilirubin Urine NEGATIVE NEGATIVE  Ketones, ur 80 (A) NEGATIVE mg/dL   Protein, ur 045 (A) NEGATIVE mg/dL   Nitrite NEGATIVE NEGATIVE   Leukocytes,Ua NEGATIVE NEGATIVE   RBC / HPF 6-10 0 - 5 RBC/hpf   WBC, UA 11-20 0 - 5 WBC/hpf   Bacteria, UA RARE (A) NONE SEEN   Squamous Epithelial / LPF 21-50 0 - 5   Mucus PRESENT     Comment: Performed at [facility], [address] [place], [place] [address]  Blood gas, venous     Status: Abnormal   Collection Time: 08/01/18  3:36 AM  Result Value Ref Range   pH, Ven 7.41 7.250 - 7.430   pCO2, Ven 39 (L) 44.0 - 60.0 mmHg   pO2, Ven 35.0 32.0 - 45.0 mmHg   Bicarbonate 24.7 20.0 - 28.0 mmol/L   Acid-Base Excess 0.1 0.0 - 2.0 mmol/L   O2 Saturation 68.0 %   Patient temperature 37.0    Sample type VENOUS     Comment: Performed at [facility], [address] [place], [place] [address]  Glucose, capillary     Status: Abnormal   Collection Time: 08/01/18  4:00 AM  Result Value Ref Range   Glucose-Capillary 216 (H) 70 - 99 mg/dL  Glucose, capillary     Status: Abnormal   Collection Time: 08/01/18  5:37 AM  Result Value Ref Range   Glucose-Capillary 183 (H) 70 - 99 mg/dL    Imaging US Ob Comp Less 14 Wks  Result Date: 08/01/2018 CLINICAL DATA:  Hyper emesis. Syncope x2. Pregnant patient. Patient 13 weeks and 1 day pregnant based on her first ultrasound. EXAM: OBSTETRIC <14 WK ULTRASOUND TECHNIQUE: Transabdominal ultrasound was performed for evaluation of the gestation as well as the maternal uterus and adnexal regions. COMPARISON:  None FINDINGS: Intrauterine gestational sac: Single Yolk sac:  Not Visualized. Embryo:  Visualized. Cardiac Activity: Visualized. Heart Rate: 163 bpm CRL:   64.4 mm   12 w 6 d                  [place] EDC:  02/07/2019 Subchorionic hemorrhage:  None visualized. Maternal uterus/adnexae: No uterine masses. Cervix is closed. Neither ovary visualized. No adnexal masses and no pelvic free fluid. IMPRESSION: 1. Single live intrauterine pregnancy with a measured gestational age of [redacted] weeks and 6 days, consistent with the expected gestational age based on the patient has reported first obstetrical ultrasound for this pregnancy. 2. No pregnancy complication. Electronically Signed   By: Amie Portland M.D.   On: 08/01/2018 05:32    Assessment: 28 y.o. [id number] at [redacted]w[redacted]d with hyperemesis gravidarum, type II DM  Plan: 1) Hyperemesis  - monitor electrolytes (repeat BMP and Magnesium at noon) - scheduled metoclopramide q8-hrs - Zofran prn  2) DM II - hold metformin - SSI for coverage - Lantus 14U at bedtime - As po tolerances improves will adjustr long acting.  Per review of her prior records outside of pregnancy in 2016 she was on 48 units of lantus and 10 units aspart with meals along with metformin  bid - Most recent HgbA1C on record in 9.3 with a CBG value of 368 on 07/04/14, and C-peptide was elevated at 5.5 consistent with type II DM.  Will recheck C-peptide and HgbA1C  3) FEN - Full liquid - NS at 116mL/hr - repeat BMP and magensium level, anticipate will need K repletion as BG comes down  4) DVT ppx - low risk ambulatory  5) Disposition - pending improvement in  symptoms and demonstration of po tolerance   Vena Austria, MD, [facility], [facility] 08/01/2018, 6:45 AM

## 2018-08-01 NOTE — ED Notes (Signed)
ED TO INPATIENT HANDOFF REPORT  ED Nurse Name and Phone #:  [phone]  S Name/Age/Gender Madison Harrell 28 y.o. female Room/Bed: ED10A/ED10A  Code Status   Code Status: Full Code  Home/SNF/Other Home Patient oriented to: self, place, time and situation Is this baseline? Yes   Triage Complete: Triage complete  Chief Complaint 13 wks preg vomiting  Triage Note Patient reports being [redacted] weeks pregnant and having vomiting.  Reports she has been treated before for hyperemesis.   Allergies No Known Allergies  Level of Care/Admitting Diagnosis ED Disposition    ED Disposition Condition Comment   Admit  Hospital Area: [facility] [[id number]]  Level of Care: Antepartum [20]  Diagnosis: Hyperemesis gravidarum [937342]  Admitting Physician: Vena Austria [[id number]]  Attending Physician: Vena Austria [phone]  PT Class (Do Not Modify): Observation [104]  PT Acc Code (Do Not Modify): Observation [10022]       B Medical/Surgery History Past Medical History:  Diagnosis Date  . Diabetes mellitus without complication [facility])    Past Surgical History:  Procedure Laterality Date  . CESAREAN SECTION  2013     A IV Location/Drains/Wounds Patient Lines/Drains/Airways Status   Active Line/Drains/Airways    Name:   Placement date:   Placement time:   Site:   Days:   Peripheral IV 08/01/18 Left Antecubital   08/01/18    0300    Antecubital   less than 1          Intake/Output Last 24 hours  Intake/Output Summary (Last 24 hours) at 08/01/2018 0837 Last data filed at 08/01/2018 0655 Gross per 24 hour  Intake 1500 ml  Output -  Net 1500 ml    Labs/Imaging Results for orders placed or performed during the hospital encounter of 08/01/18 (from the past 48 hour(s))  CBC     Status: Abnormal   Collection Time: 08/01/18  3:03 AM  Result Value Ref Range   WBC 10.9 (H) 4.0 - 10.5 K/uL   RBC 4.79 3.87 - 5.11 MIL/uL   Hemoglobin 15.4 (H) 12.0 - 15.0 g/dL    HCT 35.5 97.4 - 16.3 %   MCV 88.5 80.0 - 100.0 fL   MCH 32.2 26.0 - 34.0 pg   MCHC 36.3 (H) 30.0 - 36.0 g/dL   RDW 84.5 36.4 - 68.0 %   Platelets 408 (H) 150 - 400 K/uL   nRBC 0.0 0.0 - 0.2 %    Comment: Performed at [facility], [address] [place], [place] [address]  Basic metabolic panel     Status: Abnormal   Collection Time: 08/01/18  3:03 AM  Result Value Ref Range   Sodium 134 (L) 135 - 145 mmol/L   Potassium 3.4 (L) 3.5 - 5.1 mmol/L    Comment: HEMOLYSIS AT THIS LEVEL MAY AFFECT RESULT   Chloride 99 98 - 111 mmol/L   CO2 20 (L) 22 - 32 mmol/L   Glucose, Bld 223 (H) 70 - 99 mg/dL   BUN 21 (H) 6 - 20 mg/dL   Creatinine, Ser 4.82 (L) 0.44 - 1.00 mg/dL   Calcium 9.1 8.9 - 50.0 mg/dL   GFR calc non Af Amer >60 >60 mL/min   GFR calc Af Amer >60 >60 mL/min   Anion gap 15 5 - 15    Comment: Performed at [facility], [address] [place], [place] [address]  Urinalysis, Complete w Microscopic     Status: Abnormal   Collection Time: 08/01/18  3:03 AM  Result Value Ref Range   Color, Urine AMBER (A) YELLOW    Comment: BIOCHEMICALS MAY BE AFFECTED BY COLOR   APPearance CLOUDY (A) CLEAR   Specific Gravity, Urine 1.042 (H) 1.005 - 1.030   pH 6.0 5.0 - 8.0   Glucose, UA >=500 (A) NEGATIVE mg/dL   Hgb urine dipstick NEGATIVE NEGATIVE   Bilirubin Urine NEGATIVE NEGATIVE   Ketones, ur 80 (A) NEGATIVE mg/dL   Protein, ur 831 (A) NEGATIVE mg/dL   Nitrite NEGATIVE NEGATIVE   Leukocytes,Ua NEGATIVE NEGATIVE   RBC / HPF 6-10 0 - 5 RBC/hpf   WBC, UA 11-20 0 - 5 WBC/hpf   Bacteria, UA RARE (A) NONE SEEN   Squamous Epithelial / LPF 21-50 0 - 5   Mucus PRESENT     Comment: Performed at [facility], [address] [place], [place] [address]  TSH     Status: None   Collection Time: 08/01/18  3:03 AM  Result Value Ref Range   TSH 2.977 0.350 - 4.500 uIU/mL    Comment: Performed by a 3rd Generation assay with a functional sensitivity of <=0.01  uIU/mL. Performed at [facility], [address] [place], [place] [address]   T4, free     Status: None   Collection Time: 08/01/18  3:03 AM  Result Value Ref Range   Free T4 0.84 0.82 - 1.77 ng/dL    Comment: (NOTE) Biotin ingestion may interfere with free T4 tests. If the results are inconsistent with the TSH level, previous test results, or the clinical presentation, then consider biotin interference. If needed, order repeat testing after stopping biotin. Performed at [facility], [address] [place], [place] [address]   Blood gas, venous     Status: Abnormal   Collection Time: 08/01/18  3:36 AM  Result Value Ref Range   pH, Ven 7.41 7.250 - 7.430   pCO2, Ven 39 (L) 44.0 - 60.0 mmHg   pO2, Ven 35.0 32.0 - 45.0 mmHg   Bicarbonate 24.7 20.0 - 28.0 mmol/L   Acid-Base Excess 0.1 0.0 - 2.0 mmol/L   O2 Saturation 68.0 %   Patient temperature 37.0    Sample type VENOUS     Comment: Performed at [facility], [address] [place], [place] [address]  Glucose, capillary     Status: Abnormal   Collection Time: 08/01/18  4:00 AM  Result Value Ref Range   Glucose-Capillary 216 (H) 70 - 99 mg/dL  Glucose, capillary     Status: Abnormal   Collection Time: 08/01/18  5:37 AM  Result Value Ref Range   Glucose-Capillary 183 (H) 70 - 99 mg/dL   US Ob Comp Less 14 Wks  Result Date: 08/01/2018 CLINICAL DATA:  Hyper emesis. Syncope x2. Pregnant patient. Patient 13 weeks and 1 day pregnant based on her first ultrasound. EXAM: OBSTETRIC <14 WK ULTRASOUND TECHNIQUE: Transabdominal ultrasound was performed for evaluation of the gestation as well as the maternal uterus and adnexal regions. COMPARISON:  None FINDINGS: Intrauterine gestational sac: Single Yolk sac:  Not Visualized. Embryo:  Visualized. Cardiac Activity: Visualized. Heart Rate: 163 bpm CRL:   64.4 mm   12 w 6 d                  [place] EDC: 02/07/2019 Subchorionic hemorrhage:  None visualized. Maternal  uterus/adnexae: No uterine masses. Cervix is closed. Neither ovary visualized. No adnexal masses and no pelvic free fluid. IMPRESSION: 1. Single live intrauterine pregnancy with  a measured gestational age of [redacted] weeks and 6 days, consistent with the expected gestational age based on the patient has reported first obstetrical ultrasound for this pregnancy. 2. No pregnancy complication. Electronically Signed   By: Amie Portland M.D.   On: 08/01/2018 05:32    Pending Labs Unresulted Labs (From admission, onward)    Start     Ordered   08/01/18 0700  C-peptide  Once,   STAT    Comments:  LAB NEEDS A TIGER TOP    08/01/18 0700   08/01/18 0556  Hemoglobin A1c  Once,   STAT     08/01/18 0602          Vitals/Pain Today's Vitals   08/01/18 0545 08/01/18 0630 08/01/18 0700 08/01/18 0732  BP:    127/90  Pulse: 91 89 88 92  Resp:    20  Temp:    98.2 F (36.8 C)  TempSrc:    Oral  SpO2: 98% 97% 98% 97%  PainSc:        Isolation Precautions No active isolations  Medications Medications  metoCLOPramide (REGLAN) injection 10 mg (has no administration in time range)  acetaminophen (TYLENOL) tablet 650 mg (has no administration in time range)  acetaminophen (TYLENOL) tablet 650 mg (has no administration in time range)  ondansetron (ZOFRAN) injection 4 mg (has no administration in time range)  0.9 %  sodium chloride infusion (has no administration in time range)  insulin aspart (novoLOG) injection 0-15 Units (has no administration in time range)  ondansetron (ZOFRAN) injection 4 mg (4 mg Intravenous Given 08/01/18 0345)  sodium chloride 0.9 % bolus 1,500 mL (0 mLs Intravenous Stopped 08/01/18 0655)  famotidine (PEPCID) tablet 20 mg (20 mg Oral Given 08/01/18 0345)  ondansetron (ZOFRAN) injection 4 mg (4 mg Intravenous Given 08/01/18 0447)  metoCLOPramide (REGLAN) injection 10 mg (10 mg Intravenous Given 08/01/18 0447)  diphenhydrAMINE (BENADRYL) injection 12.5 mg (12.5 mg Intravenous Given 08/01/18  0447)    Mobility walks Low fall risk   Focused Assessments    R Recommendations: See Admitting Provider Note  Report given to:   Additional Notes:

## 2018-08-01 NOTE — ED Triage Notes (Signed)
Patient reports being [redacted] weeks pregnant and having vomiting.  Reports she has been treated before for hyperemesis.

## 2018-08-01 NOTE — ED Notes (Signed)
Report to alivia, rn.  

## 2018-08-01 NOTE — ED Notes (Signed)
Pt states is [redacted] weeks pregnant and has hyperemesis. Pt states she is not able to hold down any po fluids. Pt is wretching into a bag currently with no emesis production. Skin pwd, no skin tenting noted. Pt denies vaginal discharge, bleeding and abdominal pain.

## 2018-08-01 NOTE — ED Notes (Signed)
Dr. Fanny Bien in to see pt.

## 2018-08-01 NOTE — ED Notes (Signed)
Pt to ultrasound

## 2018-08-01 NOTE — ED Notes (Signed)
Medications given for nausea while pt in ultrasound.

## 2018-08-01 NOTE — Plan of Care (Signed)
Pt. States she has been Diabetic since the age of 28 y.o. Her Blood Glucose level at 2200 was 154 and Pt. Received her scheduled Lantus. Instructed Pt. In s/s Hypoglycemia and she v/o with Teach Back. Instructed Pt. To call me if she experienced any of these s/s and she v/o. Pt. Has tolerated Chicken Broth and [place] Ice.

## 2018-08-01 NOTE — ED Notes (Signed)
Lowered the head of the patient's bed. Patient continues to have nausea.

## 2018-08-01 NOTE — ED Notes (Signed)
ED TO INPATIENT HANDOFF REPORT  ED Nurse Name and Phone #: Ade Stmarie [phone]   S Name/Age/Gender Madison Harrell 28 y.o. female Room/Bed: ED10A/ED10A  Code Status   Code Status: Full Code  Home/SNF/Other Home Patient oriented to: self, place, time and situation Is this baseline? Yes   Triage Complete: Triage complete  Chief Complaint 13 wks preg vomiting  Triage Note Patient reports being [redacted] weeks pregnant and having vomiting.  Reports she has been treated before for hyperemesis.   Allergies No Known Allergies  Level of Care/Admitting Diagnosis ED Disposition    ED Disposition Condition Comment   Admit  Hospital Area: [facility] [[id number]]  Level of Care: Antepartum [20]  Diagnosis: Hyperemesis gravidarum [618485]  Admitting Physician: Vena Austria [[id number]]  Attending Physician: Vena Austria [id number]  PT Class (Do Not Modify): Observation [104]  PT Acc Code (Do Not Modify): Observation [10022]       B Medical/Surgery History Past Medical History:  Diagnosis Date  . Diabetes mellitus without complication [facility])    Past Surgical History:  Procedure Laterality Date  . CESAREAN SECTION  2013     A IV Location/Drains/Wounds Patient Lines/Drains/Airways Status   Active Line/Drains/Airways    Name:   Placement date:   Placement time:   Site:   Days:   Peripheral IV 08/01/18 Left Antecubital   08/01/18    0300    Antecubital   less than 1          Intake/Output Last 24 hours No intake or output data in the 24 hours ending 08/01/18 0606  Labs/Imaging Results for orders placed or performed during the hospital encounter of 08/01/18 (from the past 48 hour(s))  CBC     Status: Abnormal   Collection Time: 08/01/18  3:03 AM  Result Value Ref Range   WBC 10.9 (H) 4.0 - 10.5 K/uL   RBC 4.79 3.87 - 5.11 MIL/uL   Hemoglobin 15.4 (H) 12.0 - 15.0 g/dL   HCT 79.4 44.6 - 19.0 %   MCV 88.5 80.0 - 100.0 fL   MCH 32.2 26.0 - 34.0 pg   MCHC  36.3 (H) 30.0 - 36.0 g/dL   RDW 12.2 24.1 - 14.6 %   Platelets 408 (H) 150 - 400 K/uL   nRBC 0.0 0.0 - 0.2 %    Comment: Performed at [facility], [address] [place], [place] [address]  Basic metabolic panel     Status: Abnormal   Collection Time: 08/01/18  3:03 AM  Result Value Ref Range   Sodium 134 (L) 135 - 145 mmol/L   Potassium 3.4 (L) 3.5 - 5.1 mmol/L    Comment: HEMOLYSIS AT THIS LEVEL MAY AFFECT RESULT   Chloride 99 98 - 111 mmol/L   CO2 20 (L) 22 - 32 mmol/L   Glucose, Bld 223 (H) 70 - 99 mg/dL   BUN 21 (H) 6 - 20 mg/dL   Creatinine, Ser 7.67 (L) 0.44 - 1.00 mg/dL   Calcium 9.1 8.9 - 01.1 mg/dL   GFR calc non Af Amer >60 >60 mL/min   GFR calc Af Amer >60 >60 mL/min   Anion gap 15 5 - 15    Comment: Performed at [facility], [address] [place], [place] [address]  Urinalysis, Complete w Microscopic     Status: Abnormal   Collection Time: 08/01/18  3:03 AM  Result Value Ref Range   Color, Urine AMBER (A) YELLOW    Comment: BIOCHEMICALS MAY BE  AFFECTED BY COLOR   APPearance CLOUDY (A) CLEAR   Specific Gravity, Urine 1.042 (H) 1.005 - 1.030   pH 6.0 5.0 - 8.0   Glucose, UA >=500 (A) NEGATIVE mg/dL   Hgb urine dipstick NEGATIVE NEGATIVE   Bilirubin Urine NEGATIVE NEGATIVE   Ketones, ur 80 (A) NEGATIVE mg/dL   Protein, ur 287 (A) NEGATIVE mg/dL   Nitrite NEGATIVE NEGATIVE   Leukocytes,Ua NEGATIVE NEGATIVE   RBC / HPF 6-10 0 - 5 RBC/hpf   WBC, UA 11-20 0 - 5 WBC/hpf   Bacteria, UA RARE (A) NONE SEEN   Squamous Epithelial / LPF 21-50 0 - 5   Mucus PRESENT     Comment: Performed at [facility], [address] [place], [place] [address]  Blood gas, venous     Status: Abnormal   Collection Time: 08/01/18  3:36 AM  Result Value Ref Range   pH, Ven 7.41 7.250 - 7.430   pCO2, Ven 39 (L) 44.0 - 60.0 mmHg   pO2, Ven 35.0 32.0 - 45.0 mmHg   Bicarbonate 24.7 20.0 - 28.0 mmol/L   Acid-Base Excess 0.1 0.0 - 2.0 mmol/L   O2 Saturation  68.0 %   Patient temperature 37.0    Sample type VENOUS     Comment: Performed at [facility], [address] [place], [place] [address]  Glucose, capillary     Status: Abnormal   Collection Time: 08/01/18  4:00 AM  Result Value Ref Range   Glucose-Capillary 216 (H) 70 - 99 mg/dL  Glucose, capillary     Status: Abnormal   Collection Time: 08/01/18  5:37 AM  Result Value Ref Range   Glucose-Capillary 183 (H) 70 - 99 mg/dL   US Ob Comp Less 14 Wks  Result Date: 08/01/2018 CLINICAL DATA:  Hyper emesis. Syncope x2. Pregnant patient. Patient 13 weeks and 1 day pregnant based on her first ultrasound. EXAM: OBSTETRIC <14 WK ULTRASOUND TECHNIQUE: Transabdominal ultrasound was performed for evaluation of the gestation as well as the maternal uterus and adnexal regions. COMPARISON:  None FINDINGS: Intrauterine gestational sac: Single Yolk sac:  Not Visualized. Embryo:  Visualized. Cardiac Activity: Visualized. Heart Rate: 163 bpm CRL:   64.4 mm   12 w 6 d                  [place] EDC: 02/07/2019 Subchorionic hemorrhage:  None visualized. Maternal uterus/adnexae: No uterine masses. Cervix is closed. Neither ovary visualized. No adnexal masses and no pelvic free fluid. IMPRESSION: 1. Single live intrauterine pregnancy with a measured gestational age of [redacted] weeks and 6 days, consistent with the expected gestational age based on the patient has reported first obstetrical ultrasound for this pregnancy. 2. No pregnancy complication. Electronically Signed   By: Amie Portland M.D.   On: 08/01/2018 05:32    Pending Labs Unresulted Labs (From admission, onward)    Start     Ordered   08/01/18 0601  T4, free  Once,   STAT     08/01/18 0602   08/01/18 0600  TSH  Once,   STAT     08/01/18 0602   08/01/18 0556  Hemoglobin A1c  Once,   STAT     08/01/18 0602   08/01/18 0556  C-peptide  Once,   STAT     08/01/18 0602          Vitals/Pain Today's Vitals   08/01/18 0429 08/01/18 0430 08/01/18 0515  08/01/18 0545  BP: (!) 133/94  Pulse: 95 (!) 114 95 91  Resp: (!) 21 18    Temp:      TempSrc:      SpO2: 99% 96% 98% 98%  PainSc:        Isolation Precautions No active isolations  Medications Medications  metoCLOPramide (REGLAN) injection 10 mg (has no administration in time range)  acetaminophen (TYLENOL) tablet 650 mg (has no administration in time range)  acetaminophen (TYLENOL) tablet 650 mg (has no administration in time range)  ondansetron (ZOFRAN) injection 4 mg (has no administration in time range)  0.9 %  sodium chloride infusion (has no administration in time range)  ondansetron (ZOFRAN) injection 4 mg (4 mg Intravenous Given 08/01/18 0345)  sodium chloride 0.9 % bolus 1,500 mL (1,500 mLs Intravenous New Bag/Given 08/01/18 0345)  famotidine (PEPCID) tablet 20 mg (20 mg Oral Given 08/01/18 0345)  ondansetron (ZOFRAN) injection 4 mg (4 mg Intravenous Given 08/01/18 0447)  metoCLOPramide (REGLAN) injection 10 mg (10 mg Intravenous Given 08/01/18 0447)  diphenhydrAMINE (BENADRYL) injection 12.5 mg (12.5 mg Intravenous Given 08/01/18 0447)    Mobility walks Low fall risk   Focused Assessments gi assessment    R Recommendations: See Admitting Provider Note  Report given to:   Additional Notes:

## 2018-08-01 NOTE — ED Notes (Signed)
Patient to CT with Madison Cosier T.

## 2018-08-01 NOTE — Plan of Care (Signed)
Pt. States she feels "Better". Tolerating Chicken Broth and [place] Ice. Has voided X2 this shift. VSS.

## 2018-08-01 NOTE — Progress Notes (Signed)
Will change to insulin resistant SSI dosage from moderate.

## 2018-08-01 NOTE — ED Provider Notes (Signed)
[facility] Emergency Department Provider Note   ____________________________________________   First MD Initiated Contact with Patient 08/01/18 [phone]     (approximate)  I have reviewed the triage vital signs and the nursing notes.   HISTORY  Chief Complaint Emesis    HPI Madison Harrell is a 28 y.o. female here for evaluation of "hyperemesis"  Patient reports this pregnancy she has had lots of vomiting.  She is been diagnosed with hyperemesis and is taking Reglan.  For the last 3 days she reports has had nothing but nausea and continuous vomiting unable to hold anything down or on her stomach.  She denies any pain.  No vaginal or pelvic pain.  No discharge.  No vaginal bleeding.  No fevers or chills.  No chest pain or shortness of breath.  She is a diabetic, utilizes insulin but has had 2 episodes where she passed out from having low blood sugars over the last 2 days.  She reports she was woke up twice with her husband having to give her sugary things in her mouth in order to wake her.  She reports she is very worried.  She has had ultrasounds at [facility] but has not been established with a doctor at their clinic yet.  Currently reports ongoing nausea, feeling like she wants to throw up.  Reports she cannot keep anything on her stomach.   Past Medical History:  Diagnosis Date  . Diabetes mellitus without complication [facility])     Patient Active Problem List   Diagnosis Date Noted  . Hyperemesis gravidarum 08/01/2018    Past Surgical History:  Procedure Laterality Date  . CESAREAN SECTION  2013    Prior to Admission medications   Medication Sig Start Date End Date Taking? Authorizing Provider  clotrimazole-betamethasone (LOTRISONE) cream Apply 1 application topically 2 (two) times daily. For 2 wks 09/02/17   Copland, Alicia B, PA-C  glucose blood (PRECISION QID TEST) test strip Frequency:QID   Dosage:0.0     Instructions:  Note:Dose: 1 07/24/11   [provider]  metFORMIN (GLUCOPHAGE) 1000 MG tablet Take 1 tablet (1,000 mg total) by mouth 2 (two) times daily with a meal. 08/13/17 11/11/17  Darci Current, MD  metoCLOPramide (REGLAN) 10 MG tablet Take 1 tablet (10 mg total) by mouth 3 (three) times daily with meals for 30 days. 07/03/18 08/02/18  Darci Current, MD  prenatal vitamin w/FE, FA (PRENATAL 1 + 1) 27-1 MG TABS tablet Take 1 tablet by mouth daily at 12 noon.    [provider]    Allergies Patient has no known allergies.  Family History  Problem Relation Age of Onset  . Diabetes Father     Social History Social History   Tobacco Use  . Smoking status: Former Games developer  . Smokeless tobacco: Never Used  Substance Use Topics  . Alcohol use: No  . Drug use: No    Review of Systems Constitutional: No fever/chills but feeling lightheaded.  Feels "dehydrated" Eyes: No visual changes. ENT: Scratchy throat from all the vomiting. Cardiovascular: Denies chest pain. Respiratory: Denies shortness of breath. Gastrointestinal: No abdominal pain.  No pelvic pain or vaginal bleeding. Genitourinary: Negative for dysuria. Musculoskeletal: Negative for back pain. Skin: Negative for rash. Neurological: Negative for headaches, areas of focal weakness or numbness.    ____________________________________________   PHYSICAL EXAM:  VITAL SIGNS: ED Triage Vitals  Enc Vitals Group     BP 08/01/18 0253 129/86     Pulse  Rate 08/01/18 0253 (!) 113     Resp 08/01/18 0253 (!) 22     Temp 08/01/18 0253 98.1 F (36.7 C)     Temp Source 08/01/18 0253 Oral     SpO2 08/01/18 0253 97 %     Weight --      Height --      Head Circumference --      Peak Flow --      Pain Score 08/01/18 0252 0     Pain Loc --      Pain Edu? --      Excl. in GC? --     Constitutional: Alert and oriented.  Sitting upright, appears nauseated holding emesis basin. Eyes: Conjunctivae are normal. Head: Atraumatic. Nose: No  congestion/rhinnorhea. Mouth/Throat: Mucous membranes are dry. Neck: No stridor.  Cardiovascular: Lightly tachycardic rate, regular rhythm. Grossly normal heart sounds.  Good peripheral circulation. Respiratory: Normal respiratory effort.  No retractions. Lungs CTAB. Gastrointestinal: Soft and nontender. No distention.  Not obviously gravid.  Musculoskeletal: No lower extremity tenderness nor edema. Neurologic:  Normal speech and language. No gross focal neurologic deficits are appreciated.  Skin:  Skin is warm, dry and intact. No rash noted. Psychiatric: Mood and affect are normal. Speech and behavior are normal.  ____________________________________________   LABS (all labs ordered are listed, but only abnormal results are displayed)  Labs Reviewed  CBC - Abnormal; Notable for the following components:      Result Value   WBC 10.9 (*)    Hemoglobin 15.4 (*)    MCHC 36.3 (*)    Platelets 408 (*)    All other components within normal limits  BASIC METABOLIC PANEL - Abnormal; Notable for the following components:   Sodium 134 (*)    Potassium 3.4 (*)    CO2 20 (*)    Glucose, Bld 223 (*)    BUN 21 (*)    Creatinine, Ser 0.33 (*)    All other components within normal limits  URINALYSIS, COMPLETE (UACMP) WITH MICROSCOPIC - Abnormal; Notable for the following components:   Color, Urine AMBER (*)    APPearance CLOUDY (*)    Specific Gravity, Urine 1.042 (*)    Glucose, UA >=500 (*)    Ketones, ur 80 (*)    Protein, ur 100 (*)    Bacteria, UA RARE (*)    All other components within normal limits  BLOOD GAS, VENOUS - Abnormal; Notable for the following components:   pCO2, Ven 39 (*)    All other components within normal limits  GLUCOSE, CAPILLARY - Abnormal; Notable for the following components:   Glucose-Capillary 216 (*)    All other components within normal limits  GLUCOSE, CAPILLARY - Abnormal; Notable for the following components:   Glucose-Capillary 183 (*)    All other  components within normal limits  HEMOGLOBIN A1C  C-PEPTIDE  TSH  T4, FREE   ____________________________________________  EKG  ED ECG REPORT I, Sharyn Creamer, the attending physician, personally viewed and interpreted this ECG.  Date: 08/01/2018 EKG Time: 4 AM Rate: 99 Rhythm: normal sinus rhythm QRS Axis: normal Intervals: normal ST/T Wave abnormalities: normal Narrative Interpretation: no evidence of acute ischemia  ____________________________________________  RADIOLOGY  US Ob Comp Less 14 Wks  Result Date: 08/01/2018 CLINICAL DATA:  Hyper emesis. Syncope x2. Pregnant patient. Patient 13 weeks and 1 day pregnant based on her first ultrasound. EXAM: OBSTETRIC <14 WK ULTRASOUND TECHNIQUE: Transabdominal ultrasound was performed for evaluation of the gestation as well  as the maternal uterus and adnexal regions. COMPARISON:  None FINDINGS: Intrauterine gestational sac: Single Yolk sac:  Not Visualized. Embryo:  Visualized. Cardiac Activity: Visualized. Heart Rate: 163 bpm CRL:   64.4 mm   12 w 6 d                  [place] EDC: 02/07/2019 Subchorionic hemorrhage:  None visualized. Maternal uterus/adnexae: No uterine masses. Cervix is closed. Neither ovary visualized. No adnexal masses and no pelvic free fluid. IMPRESSION: 1. Single live intrauterine pregnancy with a measured gestational age of [redacted] weeks and 6 days, consistent with the expected gestational age based on the patient has reported first obstetrical ultrasound for this pregnancy. 2. No pregnancy complication. Electronically Signed   By: Amie Portland M.D.   On: 08/01/2018 05:32    Ultrasound results reviewed, positive IUP. ____________________________________________   PROCEDURES  Procedure(s) performed: None  Procedures  Critical Care performed: No  ____________________________________________   INITIAL IMPRESSION / ASSESSMENT AND PLAN / ED COURSE  Pertinent labs & imaging results that were available during my care of  the patient were reviewed by me and considered in my medical decision making (see chart for details).   Patient reports approximately [redacted] weeks pregnant.  Reports multiple episodes of emesis.  Clinically her description does seem to fit a picture of hyperemesis.  I am however also concerned that she has had 2 episodes of unresponsive this likely secondary to hypoglycemia.  She has since discontinued insulin use for the last day.  She is currently awake alert and well oriented.  Has ultrasound affirmed intrauterine pregnancy at [facility].  Does not have any pelvic symptoms such as discharge or bleeding today.  We will hydrate generously, check lab tests, evaluate for electrolyte abnormalities, continue to monitor her closely, discussed risks and benefits including and not 0 but low risk for congenital birth defects such as cleft palate with the use of ondansetron.  Patient is agreeable with receiving ondansetron given her severe and unremitting nausea and dehydration, and I think this is advisable as well.  Clinical Course as of Jul 31 601  Sun Aug 01, 2018  9201 Discussed with Dr. Bonney Aid.    [MQ]  [id number] Patient is resting, alerts to voice.  Her vomiting seems to be improving, she does have ketones noted in her urine as well is elevated glucose.  Discussed with OB/GYN, Dr. Bonney Aid and given her episodes of unresponsiveness with her diabetes and hyperemesis gravidarum that is been fairly intractable with multiple rounds of emesis despite dosing of Zofran and Reglan in the ER we will admit her for further care and management.  I discussed this with the patient who is in agreement.   [MQ]    Clinical Course User Index [MQ] Sharyn Creamer, MD     ____________________________________________   FINAL CLINICAL IMPRESSION(S) / ED DIAGNOSES  Final diagnoses:  Syncope, unspecified syncope type  Hyperemesis gravidarum  Poorly controlled diabetes      Note:  This document was prepared using  Dragon voice recognition software and may include unintentional dictation errors       Sharyn Creamer, MD 08/01/18 [phone]

## 2018-08-01 NOTE — ED Notes (Signed)
pepcid was given to pt to consume, however pt still has not consumed pepcid. Pt sleeping.

## 2018-08-02 ENCOUNTER — Encounter: Payer: Self-pay | Admitting: Certified Nurse Midwife

## 2018-08-02 DIAGNOSIS — Z3A13 13 weeks gestation of pregnancy: Secondary | ICD-10-CM | POA: Diagnosis not present

## 2018-08-02 DIAGNOSIS — O24111 Pre-existing diabetes mellitus, type 2, in pregnancy, first trimester: Secondary | ICD-10-CM | POA: Diagnosis not present

## 2018-08-02 DIAGNOSIS — O210 Mild hyperemesis gravidarum: Secondary | ICD-10-CM | POA: Diagnosis not present

## 2018-08-02 LAB — GLUCOSE, CAPILLARY
Glucose-Capillary: 101 mg/dL — ABNORMAL HIGH (ref 70–99)
Glucose-Capillary: 135 mg/dL — ABNORMAL HIGH (ref 70–99)
Glucose-Capillary: 190 mg/dL — ABNORMAL HIGH (ref 70–99)
Glucose-Capillary: 149 mg/dL — ABNORMAL HIGH (ref 70–99)
Glucose-Capillary: 181 mg/dL — ABNORMAL HIGH (ref 70–99)
Glucose-Capillary: 133 mg/dL — ABNORMAL HIGH (ref 70–99)

## 2018-08-02 LAB — BASIC METABOLIC PANEL
Anion gap: 5 (ref 5–15)
BUN: 6 mg/dL (ref 6–20)
CO2: 23 mmol/L (ref 22–32)
Calcium: 8.4 mg/dL — ABNORMAL LOW (ref 8.9–10.3)
Chloride: 107 mmol/L (ref 98–111)
Creatinine, Ser: <0.3 mg/dL — ABNORMAL LOW (ref 0.44–1.00)
Glucose, Bld: 121 mg/dL — ABNORMAL HIGH (ref 70–99)
Potassium: 3.4 mmol/L — ABNORMAL LOW (ref 3.5–5.1)
Sodium: 135 mmol/L (ref 135–145)

## 2018-08-02 LAB — MAGNESIUM: Magnesium: 1.9 mg/dL (ref 1.7–2.4)

## 2018-08-02 LAB — C-PEPTIDE: C-Peptide: 1.5 ng/mL (ref 1.1–4.4)

## 2018-08-02 MED ORDER — INSULIN ASPART 100 UNIT/ML ~~LOC~~ SOLN
3.0000 [IU] | Freq: Three times a day (TID) | SUBCUTANEOUS | Status: DC
  Administered 2018-08-02 – 2018-08-03 (×2): 3 [IU] via SUBCUTANEOUS
  Filled 2018-08-02 (×2): qty 1

## 2018-08-02 MED ORDER — INSULIN ASPART 100 UNIT/ML ~~LOC~~ SOLN: 0.0000 [IU] | Freq: Four times a day (QID) | SUBCUTANEOUS | Status: DC

## 2018-08-02 MED ORDER — INSULIN ASPART 100 UNIT/ML ~~LOC~~ SOLN
0.0000 [IU] | Freq: Four times a day (QID) | SUBCUTANEOUS | Status: DC
  Administered 2018-08-02 – 2018-08-03 (×2): 3 [IU] via SUBCUTANEOUS
  Administered 2018-08-03: 2 [IU] via SUBCUTANEOUS
  Filled 2018-08-02 (×3): qty 1

## 2018-08-02 MED ORDER — DOCUSATE SODIUM 100 MG PO CAPS
100.0000 mg | ORAL_CAPSULE | Freq: Every day | ORAL | Status: DC
  Administered 2018-08-02: 100 mg via ORAL
  Filled 2018-08-02 (×2): qty 1

## 2018-08-02 MED ORDER — METOCLOPRAMIDE HCL 10 MG PO TABS
10.0000 mg | ORAL_TABLET | Freq: Three times a day (TID) | ORAL | Status: DC
  Administered 2018-08-02 – 2018-08-03 (×4): 10 mg via ORAL
  Filled 2018-08-02 (×4): qty 1

## 2018-08-02 NOTE — Progress Notes (Addendum)
Antepartum Note. Admission 08/01/2018 with hyperemesis and uncontrolled T2DM Today's Date 08/02/2018  S: Feeling better. Keeping down full liquids. Would like to be advanced to regular diet. Last BM about 3 days ago.  O:BP 123/80 (BP Location: Right Arm)   Pulse 82   Temp 98.4 F (36.9 C) (Oral)   Resp 18   LMP 08/28/2017 (Exact Date)   SpO2 99%   Good urine output General: Asian female in NAD Respiratory/Chest: normal respiratory effort Heart: regular rate Abdomen: soft, NT, non distended. FH at about 1/2 between SP and U. FHT 151 BPM Neuro: alert, awake, answering questions appropriately Psyche: normal mood and affect  Results for orders placed or performed during the hospital encounter of 08/01/18 (from the past 24 hour(s))  Glucose, capillary     Status: Abnormal   Collection Time: 08/01/18 11:07 AM  Result Value Ref Range   Glucose-Capillary 174 (H) 70 - 99 mg/dL  Basic metabolic panel     Status: Abnormal   Collection Time: 08/01/18 11:35 AM  Result Value Ref Range   Sodium 136 135 - 145 mmol/L   Potassium 3.2 (L) 3.5 - 5.1 mmol/L   Chloride 104 98 - 111 mmol/L   CO2 23 22 - 32 mmol/L   Glucose, Bld 179 (H) 70 - 99 mg/dL   BUN 19 6 - 20 mg/dL   Creatinine, Ser <0.80 (L) 0.44 - 1.00 mg/dL   Calcium 8.1 (L) 8.9 - 10.3 mg/dL   GFR calc non Af Amer NOT CALCULATED >60 mL/min   GFR calc Af Amer NOT CALCULATED >60 mL/min   Anion gap 9 5 - 15  Magnesium     Status: None   Collection Time: 08/01/18 11:35 AM  Result Value Ref Range   Magnesium 1.9 1.7 - 2.4 mg/dL  Glucose, capillary     Status: Abnormal   Collection Time: 08/01/18  3:35 PM  Result Value Ref Range   Glucose-Capillary 134 (H) 70 - 99 mg/dL  Glucose, capillary     Status: Abnormal   Collection Time: 08/01/18  5:13 PM  Result Value Ref Range   Glucose-Capillary 143 (H) 70 - 99 mg/dL  Basic metabolic panel     Status: Abnormal   Collection Time: 08/01/18  6:53 PM  Result Value Ref Range   Sodium 133 (L) 135  - 145 mmol/L   Potassium 3.7 3.5 - 5.1 mmol/L   Chloride 106 98 - 111 mmol/L   CO2 21 (L) 22 - 32 mmol/L   Glucose, Bld 231 (H) 70 - 99 mg/dL   BUN 12 6 - 20 mg/dL   Creatinine, Ser <2.23 (L) 0.44 - 1.00 mg/dL   Calcium 7.9 (L) 8.9 - 10.3 mg/dL   GFR calc non Af Amer NOT CALCULATED >60 mL/min   GFR calc Af Amer NOT CALCULATED >60 mL/min   Anion gap 6 5 - 15  Magnesium     Status: None   Collection Time: 08/01/18  6:53 PM  Result Value Ref Range   Magnesium 1.8 1.7 - 2.4 mg/dL  Glucose, capillary     Status: Abnormal   Collection Time: 08/01/18  7:46 PM  Result Value Ref Range   Glucose-Capillary 142 (H) 70 - 99 mg/dL  Glucose, capillary     Status: Abnormal   Collection Time: 08/01/18 10:08 PM  Result Value Ref Range   Glucose-Capillary 154 (H) 70 - 99 mg/dL  Basic metabolic panel     Status: Abnormal   Collection Time: 08/02/18  4:49 AM  Result Value Ref Range   Sodium 135 135 - 145 mmol/L   Potassium 3.4 (L) 3.5 - 5.1 mmol/L   Chloride 107 98 - 111 mmol/L   CO2 23 22 - 32 mmol/L   Glucose, Bld 121 (H) 70 - 99 mg/dL   BUN 6 6 - 20 mg/dL   Creatinine, Ser <0.03 (L) 0.44 - 1.00 mg/dL   Calcium 8.4 (L) 8.9 - 10.3 mg/dL   GFR calc non Af Amer NOT CALCULATED >60 mL/min   GFR calc Af Amer NOT CALCULATED >60 mL/min   Anion gap 5 5 - 15  Magnesium     Status: None   Collection Time: 08/02/18  4:49 AM  Result Value Ref Range   Magnesium 1.9 1.7 - 2.4 mg/dL  Glucose, capillary     Status: Abnormal   Collection Time: 08/02/18  8:04 AM  Result Value Ref Range   Glucose-Capillary 101 (H) 70 - 99 mg/dL    A/P: IUP at [id number] hyperemesis  Keeping down full liquids-will advance to CHO restricted diet and get nutrition consult  Switch Reglan to po dose  Colace for stool softener  Decrease IV fluid to 43ml/hour  K+ this AM 3.4   T2DM-blood sugars decreasing nicely on the 14 U Lantus and SSI with meals  Dr Jean Rosenthal to change insulin dosing, if needed  Diabetic coordinator  consult  Farrel Conners, CNM

## 2018-08-02 NOTE — Progress Notes (Addendum)
Inpatient Diabetes Program Recommendations  Diabetes Treatment Program Recommendations  ADA Standards of Care 2019 Diabetes in Pregnancy Target Glucose Ranges:  Fasting: 60 - 90 mg/dL Preprandial: 60 - 778 mg/dL 1 hr postprandial: Less than 140mg /dL (from first bite of meal) 2 hr postprandial: Less than 120 mg/dL (from first bite of meal)   Results for Madison Harrell, Madison Harrell (MRN [id number]) as of 08/02/2018 14:11  Ref. Range 08/01/2018 05:37 08/01/2018 11:07 08/01/2018 15:35 08/01/2018 17:13 08/01/2018 19:46 08/01/2018 22:08 08/02/2018 08:04 08/02/2018 11:10 08/02/2018 13:38  Glucose-Capillary Latest Ref Range: 70 - 99 mg/dL 431 (H) 540 (H) 086 (H) 143 (H) 142 (H) 154 (H) 101 (H) 135 (H) 190 (H)    Review of Glycemic Control  Diabetes history: DM hx (dx at 28 years old) Outpatient Diabetes medications: Lantus 28 units daily, Novolog 10 units TID with meals, Metformin 1000 mg BID Current orders for Inpatient glycemic control: Lantus 14 units QHS, Novolog 0-20 units TID with meals  Inpatient Diabetes Program Recommendations:   Correction (SSI): Please discontinue current Novolog correction scale. Please use Diabetic Pregnant Patient order set to order CBGs 5x times a day (fasting, 2 hour post prandial, and bedtime) and Novolog 0-14 units QID times a day (fasting,2 hour post prandial).   Insulin-Meal Coverage: Please consider ordering Novolog 3 units TID with meals for meal coverage if patient is able to eat at least 50% of meals.  NOTE: Spoke with patient over the phone about diabetes control during pregnancy.   Patient states that she was dx with DM at 28 years old and she was started on insulin at dx. Patient notes that she had stopped taking all DM medications for about 1 1/2 years prior to finding out she was pregnant. Patient notes that she has [employer] through her husband's work and she also has pregnancy Medicaid.   Patient states she went to [facility] when she was about [redacted]  weeks pregnant and she restarted taking Lantus, Novolog, and Metformin 1000 mg BID. Patient notes that Lantus and Novolog doses have been adjusted due to issues with hypoglycemia and hyperglycemia. Patient states that she becomes symptomatic when her glucose gets in mid to low 100's mg/dl but tries to not treat hypoglycemia until glucose is less than 100 mg/dl as she can not function well when glucose is less than 100 mg/dl.  Patient notes that she has had 2 syncopal episodes over the past couple of weeks due to hypoglycemia.  Patient notes that since she found out she was pregnant, she has tried very hard to do better with medication compliance, checking glucose, and following a diabetic diet.   Discussed diet, exercise, and medication therapy to improve DM control.  Patient admits that prior to finding out she was pregnant she was not taking care of her DM as she was not on any DM medication and she was not adhering to a Carb Modified diet. Patient notes that she has been trying to do better with her diet but she notes hyperemesis over the past several weeks. Patient states that she has found that when she eats any dairy products she usually vomits within an hour after eating so she has been trying to stay away from dairy products.  . Patient states that if she is able to eat, she is tending to vomit 1-2 hours after meal intake. Patient notes that it is difficult for her to know how much insulin she should take with meals because of frequent vomiting after meal intake.  Patient states that she had some soup and part of sandwich for lunch today and has kept it down so far.  Discussed glucose targets during pregnancy and explained that she needs to keep a detailed log of glucose readings, food, and insulin taken. Discussed how the log will help her providers to determine insulin needs and what adjustments need to be made.  Discussed FreeStyle Libre (flash glucose sensor) and explained how the sensor would allow  more frequent glucose monitoring and how it shows a picture graph of glucose values between scans (within 8 hour time frame). Patient is interested in using a [employer] sensor. Encouraged patient to look online and read more about the [employer].  Informed patient that I would ask MD to prescribe at discharge but she may need to ask her PCP to prescribe if attending MD did not prescribe at discharge. Explained to patient that if FreeStyle Josephine Igo is prescribed, she needs to have pharmacist review it with her in detail when she gets if filled.  Patient notes that she has an appointment with [facility] doctor on April 16th.  Asked patient to check her glucose 5 times per day (fasting, 2 hour post prandial, and bedtime) and to keep a log book of glucose readings, insulin taken, and food intake. Patient verbalized understanding of information and she appears to be eager to do whatever she can to get DM controlled for herself and for the well-being of her baby.   Addendum 08/02/18@15 :52-Spoke with Robin, RN over the phone regarding recommendations made to continue Lantus 14 units QHS as ordered, Novolog 3 units TID with meals will be for meal coverage if patient eats at least 50% of meals, and Novolog 0-14 units QID (fasting 6am, 2 hour post prandial (10:oo, 14:00, and 19:00). Please obtain bedtime CBG but no Novolog correction will be given at that time. Diabetes Coordinator will follow up on glycemic trends in the morning and make further recommendations if needed.  Thanks, Orlando Penner, RN, MSN, CDE Diabetes Coordinator Inpatient Diabetes Program [phone] (Team Pager from 8am to 5pm)

## 2018-08-03 DIAGNOSIS — O24919 Unspecified diabetes mellitus in pregnancy, unspecified trimester: Secondary | ICD-10-CM | POA: Diagnosis present

## 2018-08-03 DIAGNOSIS — Z3A13 13 weeks gestation of pregnancy: Secondary | ICD-10-CM | POA: Diagnosis not present

## 2018-08-03 DIAGNOSIS — O210 Mild hyperemesis gravidarum: Secondary | ICD-10-CM | POA: Diagnosis not present

## 2018-08-03 DIAGNOSIS — O24111 Pre-existing diabetes mellitus, type 2, in pregnancy, first trimester: Secondary | ICD-10-CM | POA: Diagnosis not present

## 2018-08-03 LAB — GLUCOSE, CAPILLARY
Glucose-Capillary: 143 mg/dL — ABNORMAL HIGH (ref 70–99)
Glucose-Capillary: 175 mg/dL — ABNORMAL HIGH (ref 70–99)

## 2018-08-03 MED ORDER — METOCLOPRAMIDE HCL 10 MG PO TABS: 10.0000 mg | ORAL_TABLET | Freq: Three times a day (TID) | ORAL | 1 refills | Status: DC

## 2018-08-03 MED ORDER — INSULIN ASPART 100 UNIT/ML ~~LOC~~ SOLN: 5.0000 [IU] | Freq: Three times a day (TID) | SUBCUTANEOUS | 11 refills | Status: DC

## 2018-08-03 MED ORDER — ONDANSETRON 4 MG PO TBDP: 4.0000 mg | ORAL_TABLET | Freq: Four times a day (QID) | ORAL | 0 refills | Status: DC | PRN

## 2018-08-03 MED ORDER — INSULIN GLARGINE 100 UNIT/ML ~~LOC~~ SOLN: 14.0000 [IU] | Freq: Every day | SUBCUTANEOUS | 11 refills | Status: DC

## 2018-08-03 NOTE — Discharge Summary (Signed)
Physician Discharge Summary  Patient ID: Madison Harrell MRN: [id number] DOB/AGE: 12-22-1990 28 y.o.  Admit date: 08/01/2018 Discharge date: 08/03/2018  Admission Diagnoses:    Hyperemesis gravidarum   Diabetes in pregnancy  Discharge Diagnoses:  Active Problems:   Hyperemesis gravidarum   Diabetes in pregnancy   Discharged Condition: good  Hospital Course: Pt seen and examined with a 13 3/[redacted] weeks EGA pregnancy and known diabetes affecting pregnancy, with worsening nausea and vomiting, leading to dehydration, but also stopped her Insulin because of poor intake, which led to elevated blood sugar readings.   Admitted and managed with fluids, antiemetics, and restarted on an Insulin regimen.  Adjustments done, and ready for discharge.  Fetal well being reassuring while inpatient; risks of hyperglycemia on fetus counseled.    Consults: Nutritionist, Diabetic case manager  Significant Diagnostic Studies:  Results for orders placed or performed during the hospital encounter of 08/01/18  CBC  Result Value Ref Range   WBC 10.9 (H) 4.0 - 10.5 K/uL   RBC 4.79 3.87 - 5.11 MIL/uL   Hemoglobin 15.4 (H) 12.0 - 15.0 g/dL   HCT 40.9 81.1 - 91.4 %   MCV 88.5 80.0 - 100.0 fL   MCH 32.2 26.0 - 34.0 pg   MCHC 36.3 (H) 30.0 - 36.0 g/dL   RDW 78.2 95.6 - 21.3 %   Platelets 408 (H) 150 - 400 K/uL   nRBC 0.0 0.0 - 0.2 %  Basic metabolic panel  Result Value Ref Range   Sodium 134 (L) 135 - 145 mmol/L   Potassium 3.4 (L) 3.5 - 5.1 mmol/L   Chloride 99 98 - 111 mmol/L   CO2 20 (L) 22 - 32 mmol/L   Glucose, Bld 223 (H) 70 - 99 mg/dL   BUN 21 (H) 6 - 20 mg/dL   Creatinine, Ser 0.86 (L) 0.44 - 1.00 mg/dL   Calcium 9.1 8.9 - 57.8 mg/dL   GFR calc non Af Amer >60 >60 mL/min   GFR calc Af Amer >60 >60 mL/min   Anion gap 15 5 - 15  Urinalysis, Complete w Microscopic  Result Value Ref Range   Color, Urine AMBER (A) YELLOW   APPearance CLOUDY (A) CLEAR   Specific Gravity, Urine 1.042 (H) 1.005 - 1.030    pH 6.0 5.0 - 8.0   Glucose, UA >=500 (A) NEGATIVE mg/dL   Hgb urine dipstick NEGATIVE NEGATIVE   Bilirubin Urine NEGATIVE NEGATIVE   Ketones, ur 80 (A) NEGATIVE mg/dL   Protein, ur 469 (A) NEGATIVE mg/dL   Nitrite NEGATIVE NEGATIVE   Leukocytes,Ua NEGATIVE NEGATIVE   RBC / HPF 6-10 0 - 5 RBC/hpf   WBC, UA 11-20 0 - 5 WBC/hpf   Bacteria, UA RARE (A) NONE SEEN   Squamous Epithelial / LPF 21-50 0 - 5   Mucus PRESENT   Blood gas, venous  Result Value Ref Range   pH, Ven 7.41 7.250 - 7.430   pCO2, Ven 39 (L) 44.0 - 60.0 mmHg   pO2, Ven 35.0 32.0 - 45.0 mmHg   Bicarbonate 24.7 20.0 - 28.0 mmol/L   Acid-Base Excess 0.1 0.0 - 2.0 mmol/L   O2 Saturation 68.0 %   Patient temperature 37.0    Sample type VENOUS   Glucose, capillary  Result Value Ref Range   Glucose-Capillary 216 (H) 70 - 99 mg/dL  Glucose, capillary  Result Value Ref Range   Glucose-Capillary 183 (H) 70 - 99 mg/dL  Hemoglobin G2X  Result Value Ref Range  Hgb A1c MFr Bld 7.2 (H) 4.8 - 5.6 %   Mean Plasma Glucose 159.94 mg/dL  TSH  Result Value Ref Range   TSH 2.977 0.350 - 4.500 uIU/mL  T4, free  Result Value Ref Range   Free T4 0.84 0.82 - 1.77 ng/dL  C-peptide  Result Value Ref Range   C-Peptide 1.5 1.1 - 4.4 ng/mL  Basic metabolic panel  Result Value Ref Range   Sodium 136 135 - 145 mmol/L   Potassium 3.2 (L) 3.5 - 5.1 mmol/L   Chloride 104 98 - 111 mmol/L   CO2 23 22 - 32 mmol/L   Glucose, Bld 179 (H) 70 - 99 mg/dL   BUN 19 6 - 20 mg/dL   Creatinine, Ser <4.09<0.30 (L) 0.44 - 1.00 mg/dL   Calcium 8.1 (L) 8.9 - 10.3 mg/dL   GFR calc non Af Amer NOT CALCULATED >60 mL/min   GFR calc Af Amer NOT CALCULATED >60 mL/min   Anion gap 9 5 - 15  Magnesium  Result Value Ref Range   Magnesium 1.9 1.7 - 2.4 mg/dL  Glucose, capillary  Result Value Ref Range   Glucose-Capillary 174 (H) 70 - 99 mg/dL  Glucose, capillary  Result Value Ref Range   Glucose-Capillary 134 (H) 70 - 99 mg/dL  Glucose, capillary  Result  Value Ref Range   Glucose-Capillary 143 (H) 70 - 99 mg/dL  Basic metabolic panel  Result Value Ref Range   Sodium 133 (L) 135 - 145 mmol/L   Potassium 3.7 3.5 - 5.1 mmol/L   Chloride 106 98 - 111 mmol/L   CO2 21 (L) 22 - 32 mmol/L   Glucose, Bld 231 (H) 70 - 99 mg/dL   BUN 12 6 - 20 mg/dL   Creatinine, Ser <8.11<0.30 (L) 0.44 - 1.00 mg/dL   Calcium 7.9 (L) 8.9 - 10.3 mg/dL   GFR calc non Af Amer NOT CALCULATED >60 mL/min   GFR calc Af Amer NOT CALCULATED >60 mL/min   Anion gap 6 5 - 15  Magnesium  Result Value Ref Range   Magnesium 1.8 1.7 - 2.4 mg/dL  Basic metabolic panel  Result Value Ref Range   Sodium 135 135 - 145 mmol/L   Potassium 3.4 (L) 3.5 - 5.1 mmol/L   Chloride 107 98 - 111 mmol/L   CO2 23 22 - 32 mmol/L   Glucose, Bld 121 (H) 70 - 99 mg/dL   BUN 6 6 - 20 mg/dL   Creatinine, Ser <9.14<0.30 (L) 0.44 - 1.00 mg/dL   Calcium 8.4 (L) 8.9 - 10.3 mg/dL   GFR calc non Af Amer NOT CALCULATED >60 mL/min   GFR calc Af Amer NOT CALCULATED >60 mL/min   Anion gap 5 5 - 15  Magnesium  Result Value Ref Range   Magnesium 1.9 1.7 - 2.4 mg/dL  Glucose, capillary  Result Value Ref Range   Glucose-Capillary 142 (H) 70 - 99 mg/dL  Glucose, capillary  Result Value Ref Range   Glucose-Capillary 154 (H) 70 - 99 mg/dL  Glucose, capillary  Result Value Ref Range   Glucose-Capillary 101 (H) 70 - 99 mg/dL  Glucose, capillary  Result Value Ref Range   Glucose-Capillary 135 (H) 70 - 99 mg/dL  Glucose, capillary  Result Value Ref Range   Glucose-Capillary 190 (H) 70 - 99 mg/dL   Comment 1 Document in Chart   Glucose, capillary  Result Value Ref Range   Glucose-Capillary 149 (H) 70 - 99 mg/dL   Comment 1  Document in Chart   Glucose, capillary  Result Value Ref Range   Glucose-Capillary 181 (H) 70 - 99 mg/dL  Glucose, capillary  Result Value Ref Range   Glucose-Capillary 133 (H) 70 - 99 mg/dL  Glucose, capillary  Result Value Ref Range   Glucose-Capillary 143 (H) 70 - 99 mg/dL      Treatments: fluids and Insulin  Discharge Exam: Blood pressure 102/78, pulse 88, temperature 98.3 F (36.8 C), temperature source Oral, resp. rate 20, last menstrual period 08/28/2017, SpO2 99 %. General appearance: alert, cooperative and no distress Pt resting well, no pain or bleeding  Chest clear Heart Reg Abd NT, ND Extr no edema Skin no lesions  Disposition: Discharge disposition: 01-Home or Self Care       Discharge Instructions    Call MD for:  persistant nausea and vomiting   Complete by:  As directed      Allergies as of 08/03/2018   No Known Allergies     Medication List    STOP taking these medications   clotrimazole-betamethasone cream Commonly known as:  LOTRISONE     TAKE these medications   insulin aspart 100 UNIT/ML injection Commonly known as:  novoLOG Inject 10 Units into the skin 3 (three) times daily before meals. What changed:  Another medication with the same name was added. Make sure you understand how and when to take each.   insulin aspart 100 UNIT/ML injection Commonly known as:  novoLOG Inject 5 Units into the skin 3 (three) times daily with meals. What changed:  You were already taking a medication with the same name, and this prescription was added. Make sure you understand how and when to take each.   insulin glargine 100 UNIT/ML injection Commonly known as:  LANTUS Inject 28 Units into the skin daily. What changed:  Another medication with the same name was added. Make sure you understand how and when to take each.   insulin glargine 100 UNIT/ML injection Commonly known as:  LANTUS Inject 0.14 mLs (14 Units total) into the skin daily at 10 pm. What changed:  You were already taking a medication with the same name, and this prescription was added. Make sure you understand how and when to take each.   metFORMIN 500 MG tablet Commonly known as:  GLUCOPHAGE Take 1,000 mg by mouth 2 (two) times daily.   metoCLOPramide 10 MG  tablet Commonly known as:  REGLAN Take 1 tablet (10 mg total) by mouth 4 (four) times daily -  before meals and at bedtime. What changed:  when to take this   ondansetron 4 MG disintegrating tablet Commonly known as:  Zofran ODT Take 1 tablet (4 mg total) by mouth every 6 (six) hours as needed for nausea.   prenatal vitamin w/FE, FA 27-1 MG Tabs tablet Take 1 tablet by mouth daily.      Follow-up Information    Zhou-Talbert, Ralene Bathe, MD Follow up in 1 week(s).  And Prenatal Care team (on April 16 as scheduled)  Specialty:  Family Medicine; [facility] information: [address] [place] [address] [phone]           Signed: Letitia Libra 08/03/2018, 10:37 AM

## 2018-08-03 NOTE — Progress Notes (Signed)
Inpatient Diabetes Program Recommendations  Diabetes Treatment Program Recommendations  ADA Standards of Care 2020 Diabetes in Pregnancy Target Glucose Ranges:  Fasting: 60 - 90 mg/dL Preprandial: 60 - 801 mg/dL 1 hr postprandial: Less than 140mg /dL (from first bite of meal) 2 hr postprandial: Less than 120 mg/dL (from first bite of meal)    Review of Glycemic Control  Diabetes history: DM hx (dx at 28 years old) Outpatient Diabetes medications: Lantus 28 units daily, Novolog 10 units TID with meals, Metformin 1000 mg BID Current orders for Inpatient glycemic control: Lantus 14 units QHS, Novolog 0-14 units QID (fasting, 2 hour post prandial), Novolog 3 units TID with meals for meal coverage  Inpatient Diabetes Program Recommendations:   Insulin-Basal: Please consider increasing Lantus to 16 units QHS.  Insulin-Meal Coverage: Please consider increasing meal coverage to Novolog 5 units TID with meals if patient is able to eat at least 50% of meals.  Thanks, Orlando Penner, RN, MSN, CDE Diabetes Coordinator Inpatient Diabetes Program [phone] (Team Pager from 8am to 5pm)

## 2018-08-03 NOTE — Discharge Instructions (Signed)
As of 08/03/2018: Inpatient Diabetes Program Recommendations: Metformin 1000 mg BID Insulin-Basal: Please consider increasing Lantus to 16 units QHS. Insulin-Meal Coverage: Please consider increasing meal coverage toNovolog 5 units TID with mealsif patient is able to eat at least 50% of meals.  Hyperemesis Gravidarum Hyperemesis gravidarum is a severe form of nausea and vomiting that happens during pregnancy. Hyperemesis is worse than morning sickness. It may cause you to have nausea or vomiting all day for many days. It may keep you from eating and drinking enough food and liquids, which can lead to dehydration, malnutrition, and weight loss. Hyperemesis usually occurs during the first half (the first 20 weeks) of pregnancy. It often goes away once a woman is in her second half of pregnancy. However, sometimes hyperemesis continues through an entire pregnancy. What are the causes? The cause of this condition is not known. It may be related to changes in chemicals (hormones) in the body during pregnancy, such as the high level of pregnancy hormone (human chorionic gonadotropin) or the increase in the female sex hormone (estrogen). What are the signs or symptoms? Symptoms of this condition include:  Nausea that does not go away.  Vomiting that does not allow you to keep any food down.  Weight loss.  Body fluid loss (dehydration).  Having no desire to eat, or not liking food that you have previously enjoyed. How is this diagnosed? This condition may be diagnosed based on:  A physical exam.  Your medical history.  Your symptoms.  Blood tests.  Urine tests. How is this treated? This condition is managed by controlling symptoms. This may include:  Following an eating plan. This can help lessen nausea and vomiting.  Taking prescription medicines. An eating plan and medicines are often used together to help control symptoms. If medicines do not help relieve nausea and vomiting, you  may need to receive fluids through an IV at the hospital. Follow these instructions at home: Eating and drinking   Avoid the following: ? Drinking fluids with meals. Try not to drink anything during the 30 minutes before and after your meals. ? Drinking more than 1 cup of fluid at a time. ? Eating foods that trigger your symptoms. These may include spicy foods, coffee, high-fat foods, very sweet foods, and acidic foods. ? Skipping meals. Nausea can be more intense on an empty stomach. If you cannot tolerate food, do not force it. Try sucking on ice chips or other frozen items and make up for missed calories later. ? Lying down within 2 hours after eating. ? Being exposed to environmental triggers. These may include food smells, smoky rooms, closed spaces, rooms with strong smells, warm or humid places, overly loud and noisy rooms, and rooms with motion or flickering lights. Try eating meals in a well-ventilated area that is free of strong smells. ? Quick and sudden changes in your movement. ? Taking iron pills and multivitamins that contain iron. If you take prescription iron pills, do not stop taking them unless your health care provider approves. ? Preparing food. The smell of food can spoil your appetite or trigger nausea.  To help relieve your symptoms: ? Listen to your body. Everyone is different and has different preferences. Find what works best for you. ? Eat and drink slowly. ? Eat 5-6 small meals daily instead of 3 large meals. Eating small meals and snacks can help you avoid an empty stomach. ? In the morning, before getting out of bed, eat a couple of crackers to  avoid moving around on an empty stomach. ? Try eating starchy foods as these are usually tolerated well. Examples include cereal, toast, bread, potatoes, pasta, rice, and pretzels. ? Include at least 1 serving of protein with your meals and snacks. Protein options include lean meats, poultry, seafood, beans, nuts, nut  butters, eggs, cheese, and yogurt. ? Try eating a protein-rich snack before bed. Examples of a protein-rick snack include cheese and crackers or a peanut butter sandwich made with 1 slice of whole-wheat bread and 1 tsp (5 g) of peanut butter. ? Eat or suck on things that have ginger in them. It may help relieve nausea. Add  tsp ground ginger to hot tea or choose ginger tea. ? Try drinking 100% fruit juice or an electrolyte drink. An electrolyte drink contains sodium, potassium, and chloride. ? Drink fluids that are cold, clear, and carbonated or sour. Examples include lemonade, ginger ale, lemon-lime soda, ice water, and sparkling water. ? Brush your teeth or use a mouth rinse after meals. ? Talk with your health care provider about starting a supplement of vitamin B6. General instructions  Take over-the-counter and prescription medicines only as told by your health care provider.  Follow instructions from your health care provider about eating or drinking restrictions.  Continue to take your prenatal vitamins as told by your health care provider. If you are having trouble taking your prenatal vitamins, talk with your health care provider about different options.  Keep all follow-up and pre-birth (prenatal) visits as told by your health care provider. This is important. Contact a health care provider if:  You have pain in your abdomen.  You have a severe headache.  You have vision problems.  You are losing weight.  You feel weak or dizzy. Get help right away if:  You cannot drink fluids without vomiting.  You vomit blood.  You have constant nausea and vomiting.  You are very weak.  You faint.  You have a fever and your symptoms suddenly get worse. Summary  Hyperemesis gravidarum is a severe form of nausea and vomiting that happens during pregnancy.  Making some changes to your eating habits may help relieve nausea and vomiting.  This condition may be managed with  medicine.  If medicines do not help relieve nausea and vomiting, you may need to receive fluids through an IV at the hospital. This information is not intended to replace advice given to you by your health care provider. Make sure you discuss any questions you have with your health care provider. Document Released: 04/14/2005 Document Revised: 05/04/2017 Document Reviewed: 12/12/2015 Elsevier Interactive Patient Education  2019 [employer].

## 2018-08-03 NOTE — Progress Notes (Signed)
Discharge inst reviewed with pt.  Pt verb u/o of plan and of when to call PCP.  Pt dc to home ambulatory to revolving door of hospital.

## 2018-08-03 NOTE — Plan of Care (Signed)
RD consulted for nutrition education regarding diet for GDM. RD working remotely.  Lab Results  Component Value Date   HGBA1C 7.2 (H) 08/01/2018   28 year old [id number] currently [redacted]w[redacted]d gestation with PMHx of DM type 2 currently admitted with hyperemesis gravidarum and uncontrolled DM.  Called patient over the phone to discuss nutrition history. Patient reports that PTA she had a decreased appetite and intake in setting of hyperemesis. She was having trouble keeping anything down, including water. She tries many strategies including eating room temperature or cold foods, Ginger-ale, gum, and crackers and none were successful. She has mostly been able to tolerate fruit lately. She reports she has lost 7 lbs since the start of her pregnancy, which is not ideal. She reports she is familiar with carbohydrate counting from her DMII. We reviewed differences in management of diabetes while pregnant. Patient reports her appetite has improved now and she is finishing most of her meals. She was able to keep dinner down last night but did have some emesis this morning after breakfast. She has been keeping the snacks down fairly well.  RD reviewed "Gestational Diabetes Nutrition Therapy" handout from the [employer] over the phone with patient. Handout will be mailed to patient's home. Discussed different food groups and their effects on blood sugar, emphasizing carbohydrate-containing foods. Provided list of carbohydrates and recommended serving sizes of common foods.  Discussed importance of controlled and consistent carbohydrate intake throughout the day. Provided examples of ways to balance meals/snacks and encouraged intake of high-fiber, whole grain complex carbohydrates. Teach back method used.  Recommended Nutrition Prescription for GDM: Breakfast: 30-35 grams carbohydrates Morning Snack: 30 grams carbohydrates Lunch: 45-50 grams carbohydrates Afternoon Snack: 30 grams  carbohydrates Dinner: 45-50 grams of carbohydrates Evening Snack: 30 grams of carbohydrates A minimum of 175 grams of carbohydrates is needed daily in pregnancy. Also discussed that the lower carbohydrate intake (30-35 grams) is suggested at breakfast because carbohydrate is generally less well tolerated at breakfast than at other meals during pregnancy.  Expect fair to good compliance. Will be difficult for patient to manage in setting of hyperemesis gravidarum.  Current diet order is gestational carbohydrate modified patient is consuming approximately 95-100% of meals at this time. Labs and medications reviewed. No further nutrition interventions warranted at this time. RD contact information provided. If additional nutrition issues arise, please re-consult RD.  Helane Rima, MS, RD, LDN Office: [phone] Pager: [phone] After Hours/Weekend Pager: [phone]

## 2018-10-01 ENCOUNTER — Other Ambulatory Visit: Payer: Self-pay

## 2018-10-01 ENCOUNTER — Observation Stay
Admission: EM | Admit: 2018-10-01 | Discharge: 2018-10-01 | Disposition: A | Discharge status: Home / Self Care | Payer: BC Managed Care – PPO | Attending: Certified Nurse Midwife | Admitting: Certified Nurse Midwife

## 2018-10-01 ENCOUNTER — Encounter: Payer: Self-pay | Admitting: Intensive Care

## 2018-10-01 DIAGNOSIS — O26892 Other specified pregnancy related conditions, second trimester: Secondary | ICD-10-CM | POA: Diagnosis not present

## 2018-10-01 DIAGNOSIS — R109 Unspecified abdominal pain: Secondary | ICD-10-CM | POA: Diagnosis present

## 2018-10-01 DIAGNOSIS — Z794 Long term (current) use of insulin: Secondary | ICD-10-CM | POA: Insufficient documentation

## 2018-10-01 DIAGNOSIS — Z87891 Personal history of nicotine dependence: Secondary | ICD-10-CM | POA: Diagnosis not present

## 2018-10-01 DIAGNOSIS — Z79899 Other long term (current) drug therapy: Secondary | ICD-10-CM | POA: Insufficient documentation

## 2018-10-01 DIAGNOSIS — Z3A21 21 weeks gestation of pregnancy: Secondary | ICD-10-CM

## 2018-10-01 DIAGNOSIS — O24112 Pre-existing diabetes mellitus, type 2, in pregnancy, second trimester: Secondary | ICD-10-CM

## 2018-10-01 DIAGNOSIS — Z3A22 22 weeks gestation of pregnancy: Secondary | ICD-10-CM | POA: Diagnosis not present

## 2018-10-01 DIAGNOSIS — Z9641 Presence of insulin pump (external) (internal): Secondary | ICD-10-CM

## 2018-10-01 DIAGNOSIS — Y92410 Unspecified street and highway as the place of occurrence of the external cause: Secondary | ICD-10-CM

## 2018-10-01 DIAGNOSIS — O24912 Unspecified diabetes mellitus in pregnancy, second trimester: Secondary | ICD-10-CM | POA: Insufficient documentation

## 2018-10-01 DIAGNOSIS — O9A212 Injury, poisoning and certain other consequences of external causes complicating pregnancy, second trimester: Secondary | ICD-10-CM

## 2018-10-01 DIAGNOSIS — E119 Type 2 diabetes mellitus without complications: Secondary | ICD-10-CM

## 2018-10-01 HISTORY — DX: Gestational (pregnancy-induced) hypertension without significant proteinuria, unspecified trimester: O13.9

## 2018-10-01 LAB — URINALYSIS, COMPLETE (UACMP) WITH MICROSCOPIC
Bilirubin Urine: NEGATIVE
Glucose, UA: >=500 mg/dL — AB
Hgb urine dipstick: NEGATIVE
Ketones, ur: 20 mg/dL — AB
Leukocytes,Ua: NEGATIVE
Nitrite: NEGATIVE
Protein, ur: NEGATIVE mg/dL
Specific Gravity, Urine: 1.038 — ABNORMAL HIGH (ref 1.005–1.030)
pH: 6 (ref 5.0–8.0)

## 2018-10-01 LAB — COMPREHENSIVE METABOLIC PANEL
ALT: 31 U/L (ref 0–44)
AST: 34 U/L (ref 15–41)
Albumin: 3.1 g/dL — ABNORMAL LOW (ref 3.5–5.0)
Alkaline Phosphatase: 65 U/L (ref 38–126)
Anion gap: 12 (ref 5–15)
BUN: 12 mg/dL (ref 6–20)
CO2: 17 mmol/L — ABNORMAL LOW (ref 22–32)
Calcium: 8.3 mg/dL — ABNORMAL LOW (ref 8.9–10.3)
Chloride: 105 mmol/L (ref 98–111)
Creatinine, Ser: 0.36 mg/dL — ABNORMAL LOW (ref 0.44–1.00)
GFR calc Af Amer: >60 mL/min (ref >60)
GFR calc non Af Amer: >60 mL/min (ref >60)
Glucose, Bld: 269 mg/dL — ABNORMAL HIGH (ref 70–99)
Potassium: 3.8 mmol/L (ref 3.5–5.1)
Sodium: 134 mmol/L — ABNORMAL LOW (ref 135–145)
Total Bilirubin: 0.7 mg/dL (ref 0.3–1.2)
Total Protein: 6.2 g/dL — ABNORMAL LOW (ref 6.5–8.1)

## 2018-10-01 LAB — CBC WITH DIFFERENTIAL/PLATELET
Abs Immature Granulocytes: 0.05 10*3/uL (ref 0.00–0.07)
Basophils Absolute: 0 10*3/uL (ref 0.0–0.1)
Basophils Relative: 0 %
Eosinophils Absolute: 0 10*3/uL (ref 0.0–0.5)
Eosinophils Relative: 0 %
HCT: 32.8 % — ABNORMAL LOW (ref 36.0–46.0)
Hemoglobin: 11.8 g/dL — ABNORMAL LOW (ref 12.0–15.0)
Immature Granulocytes: 1 %
Lymphocytes Relative: 19 %
Lymphs Abs: 1.4 10*3/uL (ref 0.7–4.0)
MCH: 33.1 pg (ref 26.0–34.0)
MCHC: 36 g/dL (ref 30.0–36.0)
MCV: 92.1 fL (ref 80.0–100.0)
Monocytes Absolute: 0.5 10*3/uL (ref 0.1–1.0)
Monocytes Relative: 6 %
Neutro Abs: 5.5 10*3/uL (ref 1.7–7.7)
Neutrophils Relative %: 74 %
Platelets: 319 10*3/uL (ref 150–400)
RBC: 3.56 MIL/uL — ABNORMAL LOW (ref 3.87–5.11)
RDW: 12.6 % (ref 11.5–15.5)
WBC: 7.4 10*3/uL (ref 4.0–10.5)
nRBC: 0 % (ref 0.0–0.2)

## 2018-10-01 LAB — GLUCOSE, CAPILLARY
Glucose-Capillary: 271 mg/dL — ABNORMAL HIGH (ref 70–99)
Glucose-Capillary: 112 mg/dL — ABNORMAL HIGH (ref 70–99)

## 2018-10-01 LAB — LIPASE, BLOOD: Lipase: 27 U/L (ref 11–51)

## 2018-10-01 MED ORDER — INSULIN REGULAR HUMAN 100 UNIT/ML IJ SOLN
10.0000 [IU] | INTRAMUSCULAR | Status: DC
  Filled 2018-10-01: qty 10

## 2018-10-01 MED ORDER — ACETAMINOPHEN 500 MG PO TABS: 1000.0000 mg | ORAL_TABLET | Freq: Four times a day (QID) | ORAL | 0 refills | Status: DC | PRN

## 2018-10-01 MED ORDER — ACETAMINOPHEN 500 MG PO TABS
1000.0000 mg | ORAL_TABLET | Freq: Four times a day (QID) | ORAL | Status: DC | PRN
  Administered 2018-10-01: 1000 mg via ORAL
  Filled 2018-10-01: qty 2
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
## 2018-10-01 NOTE — Discharge Instructions (Signed)
Please return to labor and delivery for contractions (6 or more in 1 hour), vaginal bleeding, decreased fetal movement.

## 2018-10-01 NOTE — ED Notes (Signed)
Blood sugar 279
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
## 2018-12-23 DIAGNOSIS — Q200 Common arterial trunk: Secondary | ICD-10-CM

## 2018-12-23 DIAGNOSIS — O1490 Unspecified pre-eclampsia, unspecified trimester: Secondary | ICD-10-CM

## 2019-04-30 ENCOUNTER — Inpatient Hospital Stay
Admission: EM | Admit: 2019-04-30 | Discharge: 2019-05-02 | DRG: 871 | Disposition: A | Discharge status: Home / Self Care | Payer: BC Managed Care – PPO | Attending: Internal Medicine | Admitting: Internal Medicine

## 2019-04-30 ENCOUNTER — Other Ambulatory Visit: Payer: Self-pay

## 2019-04-30 ENCOUNTER — Emergency Department: Payer: BC Managed Care – PPO

## 2019-04-30 DIAGNOSIS — A4189 Other specified sepsis: Principal | ICD-10-CM | POA: Diagnosis present

## 2019-04-30 DIAGNOSIS — A4150 Gram-negative sepsis, unspecified: Secondary | ICD-10-CM

## 2019-04-30 DIAGNOSIS — E0810 Diabetes mellitus due to underlying condition with ketoacidosis without coma: Secondary | ICD-10-CM | POA: Diagnosis not present

## 2019-04-30 DIAGNOSIS — F319 Bipolar disorder, unspecified: Secondary | ICD-10-CM | POA: Diagnosis present

## 2019-04-30 DIAGNOSIS — Z794 Long term (current) use of insulin: Secondary | ICD-10-CM

## 2019-04-30 DIAGNOSIS — Z79899 Other long term (current) drug therapy: Secondary | ICD-10-CM

## 2019-04-30 DIAGNOSIS — R519 Headache, unspecified: Secondary | ICD-10-CM

## 2019-04-30 DIAGNOSIS — N390 Urinary tract infection, site not specified: Secondary | ICD-10-CM | POA: Diagnosis not present

## 2019-04-30 DIAGNOSIS — E1165 Type 2 diabetes mellitus with hyperglycemia: Secondary | ICD-10-CM | POA: Diagnosis not present

## 2019-04-30 DIAGNOSIS — Z87891 Personal history of nicotine dependence: Secondary | ICD-10-CM | POA: Diagnosis not present

## 2019-04-30 DIAGNOSIS — Z833 Family history of diabetes mellitus: Secondary | ICD-10-CM | POA: Diagnosis not present

## 2019-04-30 DIAGNOSIS — U071 COVID-19: Secondary | ICD-10-CM | POA: Diagnosis present

## 2019-04-30 DIAGNOSIS — E1110 Type 2 diabetes mellitus with ketoacidosis without coma: Secondary | ICD-10-CM

## 2019-04-30 DIAGNOSIS — Z23 Encounter for immunization: Secondary | ICD-10-CM

## 2019-04-30 LAB — CBC WITH DIFFERENTIAL/PLATELET
Abs Immature Granulocytes: 0.03 10*3/uL (ref 0.00–0.07)
Basophils Absolute: 0 10*3/uL (ref 0.0–0.1)
Basophils Relative: 0 %
Eosinophils Absolute: 0 10*3/uL (ref 0.0–0.5)
Eosinophils Relative: 0 %
HCT: 44.9 % (ref 36.0–46.0)
Hemoglobin: 13.9 g/dL (ref 12.0–15.0)
Immature Granulocytes: 0 %
Lymphocytes Relative: 15 %
Lymphs Abs: 1.2 10*3/uL (ref 0.7–4.0)
MCH: 22.4 pg — ABNORMAL LOW (ref 26.0–34.0)
MCHC: 31 g/dL (ref 30.0–36.0)
MCV: 72.4 fL — ABNORMAL LOW (ref 80.0–100.0)
Monocytes Absolute: 0.7 10*3/uL (ref 0.1–1.0)
Monocytes Relative: 8 %
Neutro Abs: 6.3 10*3/uL (ref 1.7–7.7)
Neutrophils Relative %: 77 %
Platelets: 364 10*3/uL (ref 150–400)
RBC: 6.2 MIL/uL — ABNORMAL HIGH (ref 3.87–5.11)
RDW: 18.6 % — ABNORMAL HIGH (ref 11.5–15.5)
WBC: 8.2 10*3/uL (ref 4.0–10.5)
nRBC: 0 % (ref 0.0–0.2)

## 2019-04-30 LAB — URINALYSIS, COMPLETE (UACMP) WITH MICROSCOPIC
Bilirubin Urine: NEGATIVE
Glucose, UA: >=500 mg/dL — AB
Ketones, ur: 80 mg/dL — AB
Nitrite: NEGATIVE
Protein, ur: >=300 mg/dL — AB
Specific Gravity, Urine: 1.039 — ABNORMAL HIGH (ref 1.005–1.030)
pH: 5 (ref 5.0–8.0)

## 2019-04-30 LAB — COMPREHENSIVE METABOLIC PANEL
ALT: 137 U/L — ABNORMAL HIGH (ref 0–44)
AST: 93 U/L — ABNORMAL HIGH (ref 15–41)
Albumin: 4.6 g/dL (ref 3.5–5.0)
Alkaline Phosphatase: 126 U/L (ref 38–126)
Anion gap: 16 — ABNORMAL HIGH (ref 5–15)
BUN: 15 mg/dL (ref 6–20)
CO2: 19 mmol/L — ABNORMAL LOW (ref 22–32)
Calcium: 9 mg/dL (ref 8.9–10.3)
Chloride: 99 mmol/L (ref 98–111)
Creatinine, Ser: 0.6 mg/dL (ref 0.44–1.00)
GFR calc Af Amer: >60 mL/min (ref >60)
GFR calc non Af Amer: >60 mL/min (ref >60)
Glucose, Bld: 312 mg/dL — ABNORMAL HIGH (ref 70–99)
Potassium: 4 mmol/L (ref 3.5–5.1)
Sodium: 134 mmol/L — ABNORMAL LOW (ref 135–145)
Total Bilirubin: 0.9 mg/dL (ref 0.3–1.2)
Total Protein: 8.9 g/dL — ABNORMAL HIGH (ref 6.5–8.1)

## 2019-04-30 LAB — BLOOD GAS, VENOUS
Acid-base deficit: 4.1 mmol/L — ABNORMAL HIGH (ref 0.0–2.0)
Bicarbonate: 20 mmol/L (ref 20.0–28.0)
O2 Saturation: 89.9 %
Patient temperature: 37
pCO2, Ven: 33 mmHg — ABNORMAL LOW (ref 44.0–60.0)
pH, Ven: 7.39 (ref 7.250–7.430)
pO2, Ven: 59 mmHg — ABNORMAL HIGH (ref 32.0–45.0)

## 2019-04-30 LAB — POC SARS CORONAVIRUS 2 AG: SARS Coronavirus 2 Ag: POSITIVE — AB

## 2019-04-30 LAB — BETA-HYDROXYBUTYRIC ACID: Beta-Hydroxybutyric Acid: 2.3 mmol/L — ABNORMAL HIGH (ref 0.05–0.27)

## 2019-04-30 LAB — GLUCOSE, CAPILLARY
Glucose-Capillary: 350 mg/dL — ABNORMAL HIGH (ref 70–99)
Glucose-Capillary: 278 mg/dL — ABNORMAL HIGH (ref 70–99)

## 2019-04-30 LAB — LACTATE DEHYDROGENASE: LDH: 273 U/L — ABNORMAL HIGH (ref 98–192)

## 2019-04-30 LAB — GROUP A STREP BY PCR: Group A Strep by PCR: NOT DETECTED

## 2019-04-30 LAB — LACTIC ACID, PLASMA: Lactic Acid, Venous: 0.9 mmol/L (ref 0.5–1.9)

## 2019-04-30 LAB — INFLUENZA PANEL BY PCR (TYPE A & B)
Influenza A By PCR: NEGATIVE
Influenza B By PCR: NEGATIVE

## 2019-04-30 LAB — POCT PREGNANCY, URINE: Preg Test, Ur: NEGATIVE

## 2019-04-30 LAB — FERRITIN: Ferritin: 30 ng/mL (ref 11–307)

## 2019-04-30 MED ORDER — INSULIN ASPART 100 UNIT/ML ~~LOC~~ SOLN: 10.0000 [IU] | Freq: Three times a day (TID) | SUBCUTANEOUS | Status: DC

## 2019-04-30 MED ORDER — GUAIFENESIN-DM 100-10 MG/5ML PO SYRP
10.0000 mL | ORAL_SOLUTION | ORAL | Status: DC | PRN
  Filled 2019-04-30: qty 10

## 2019-04-30 MED ORDER — SODIUM CHLORIDE 0.9 % IV SOLN
1.0000 g | Freq: Once | INTRAVENOUS | Status: AC
  Administered 2019-04-30: 1 g via INTRAVENOUS
  Filled 2019-04-30: qty 10

## 2019-04-30 MED ORDER — INSULIN NPH (HUMAN) (ISOPHANE) 100 UNIT/ML ~~LOC~~ SUSP: 14.0000 [IU] | Freq: Every day | SUBCUTANEOUS | Status: DC

## 2019-04-30 MED ORDER — ACETAMINOPHEN 325 MG PO TABS
650.0000 mg | ORAL_TABLET | Freq: Four times a day (QID) | ORAL | Status: DC | PRN
  Administered 2019-04-30 – 2019-05-01 (×2): 650 mg via ORAL
  Filled 2019-04-30 (×2): qty 2

## 2019-04-30 MED ORDER — INSULIN ASPART 100 UNIT/ML ~~LOC~~ SOLN
10.0000 [IU] | Freq: Every day | SUBCUTANEOUS | Status: DC
  Administered 2019-05-01 – 2019-05-02 (×2): 10 [IU] via SUBCUTANEOUS
  Filled 2019-04-30 (×2): qty 1

## 2019-04-30 MED ORDER — DEXAMETHASONE SODIUM PHOSPHATE 10 MG/ML IJ SOLN: 6.0000 mg | INTRAMUSCULAR | Status: DC

## 2019-04-30 MED ORDER — SERTRALINE HCL 50 MG PO TABS: 50.0000 mg | ORAL_TABLET | Freq: Every day | ORAL | Status: DC

## 2019-04-30 MED ORDER — ENOXAPARIN SODIUM 40 MG/0.4ML ~~LOC~~ SOLN
40.0000 mg | SUBCUTANEOUS | Status: DC
  Administered 2019-04-30 – 2019-05-01 (×2): 40 mg via SUBCUTANEOUS
  Filled 2019-04-30 (×2): qty 0.4

## 2019-04-30 MED ORDER — INSULIN ASPART 100 UNIT/ML ~~LOC~~ SOLN
5.0000 [IU] | Freq: Once | SUBCUTANEOUS | Status: AC
  Administered 2019-04-30: 5 [IU] via INTRAVENOUS
  Filled 2019-04-30: qty 1

## 2019-04-30 MED ORDER — INSULIN DETEMIR 100 UNIT/ML ~~LOC~~ SOLN: 0.1500 [IU]/kg | Freq: Two times a day (BID) | SUBCUTANEOUS | Status: DC

## 2019-04-30 MED ORDER — SODIUM CHLORIDE 0.9 % IV BOLUS
1000.0000 mL | Freq: Once | INTRAVENOUS | Status: AC
  Administered 2019-04-30: 1000 mL via INTRAVENOUS

## 2019-04-30 MED ORDER — SODIUM CHLORIDE 0.9 % IV SOLN
200.0000 mg | Freq: Once | INTRAVENOUS | Status: AC
  Administered 2019-04-30: 200 mg via INTRAVENOUS
  Filled 2019-04-30: qty 200

## 2019-04-30 MED ORDER — SODIUM CHLORIDE 0.9 % IV SOLN
100.0000 mg | Freq: Every day | INTRAVENOUS | Status: DC
  Administered 2019-05-01 – 2019-05-02 (×2): 100 mg via INTRAVENOUS
  Filled 2019-04-30: qty 20
  Filled 2019-04-30: qty 100

## 2019-04-30 MED ORDER — VITAMIN D3 25 MCG (1000 UNIT) PO TABS: 1000.0000 [IU] | ORAL_TABLET | Freq: Every day | ORAL | Status: DC

## 2019-04-30 MED ORDER — INSULIN ASPART 100 UNIT/ML ~~LOC~~ SOLN
0.0000 [IU] | SUBCUTANEOUS | Status: DC
  Administered 2019-05-01: 3 [IU] via SUBCUTANEOUS
  Administered 2019-05-01: 20 [IU] via SUBCUTANEOUS
  Administered 2019-05-01: 7 [IU] via SUBCUTANEOUS
  Administered 2019-05-01: 4 [IU] via SUBCUTANEOUS
  Administered 2019-05-01: 7 [IU] via SUBCUTANEOUS
  Administered 2019-05-02: 4 [IU] via SUBCUTANEOUS
  Administered 2019-05-02: 3 [IU] via SUBCUTANEOUS
  Administered 2019-05-02: 4 [IU] via SUBCUTANEOUS
  Filled 2019-04-30 (×9): qty 1

## 2019-04-30 MED ORDER — INSULIN ASPART 100 UNIT/ML ~~LOC~~ SOLN
6.0000 [IU] | Freq: Every day | SUBCUTANEOUS | Status: DC
  Administered 2019-05-01: 6 [IU] via SUBCUTANEOUS
  Filled 2019-04-30: qty 1

## 2019-04-30 MED ORDER — SODIUM CHLORIDE 0.9 % IV SOLN: 200.0000 mg | Freq: Once | INTRAVENOUS | Status: DC

## 2019-04-30 MED ORDER — ASPIRIN EC 81 MG PO TBEC
81.0000 mg | DELAYED_RELEASE_TABLET | Freq: Every day | ORAL | Status: DC
  Administered 2019-04-30 – 2019-05-02 (×3): 81 mg via ORAL
  Filled 2019-04-30 (×3): qty 1

## 2019-04-30 MED ORDER — GUAIFENESIN ER 600 MG PO TB12
600.0000 mg | ORAL_TABLET | Freq: Two times a day (BID) | ORAL | Status: DC
  Administered 2019-05-01 – 2019-05-02 (×3): 600 mg via ORAL
  Filled 2019-04-30 (×3): qty 1

## 2019-04-30 MED ORDER — ASCORBIC ACID 500 MG PO TABS
500.0000 mg | ORAL_TABLET | Freq: Every day | ORAL | Status: DC
  Administered 2019-04-30: 500 mg via ORAL
  Filled 2019-04-30: qty 1

## 2019-04-30 MED ORDER — IBUPROFEN 600 MG PO TABS
600.0000 mg | ORAL_TABLET | Freq: Once | ORAL | Status: AC
  Administered 2019-04-30: 19:00:00 600 mg via ORAL
  Filled 2019-04-30: qty 1

## 2019-04-30 MED ORDER — SODIUM CHLORIDE 0.9 % IV SOLN: 100.0000 mg | Freq: Every day | INTRAVENOUS | Status: DC

## 2019-04-30 MED ORDER — ONDANSETRON HCL 4 MG/2ML IJ SOLN: 4.0000 mg | Freq: Four times a day (QID) | INTRAMUSCULAR | Status: DC | PRN

## 2019-04-30 MED ORDER — SODIUM CHLORIDE 0.9 % IV BOLUS
500.0000 mL | Freq: Once | INTRAVENOUS | Status: AC
  Administered 2019-04-30: 500 mL via INTRAVENOUS

## 2019-04-30 MED ORDER — LITHIUM CARBONATE 300 MG PO TABS: 600.0000 mg | ORAL_TABLET | Freq: Every day | ORAL | Status: DC

## 2019-04-30 MED ORDER — ONDANSETRON HCL 4 MG PO TABS: 4.0000 mg | ORAL_TABLET | Freq: Four times a day (QID) | ORAL | Status: DC | PRN

## 2019-04-30 MED ORDER — ZINC SULFATE 220 (50 ZN) MG PO CAPS
220.0000 mg | ORAL_CAPSULE | Freq: Every day | ORAL | Status: DC
  Administered 2019-04-30 – 2019-05-02 (×3): 220 mg via ORAL
  Filled 2019-04-30 (×3): qty 1

## 2019-04-30 MED ORDER — DEXAMETHASONE SODIUM PHOSPHATE 10 MG/ML IJ SOLN
10.0000 mg | Freq: Once | INTRAMUSCULAR | Status: AC
  Administered 2019-04-30: 10 mg via INTRAVENOUS
  Filled 2019-04-30: qty 1

## 2019-04-30 MED ORDER — INSULIN DETEMIR 100 UNIT/ML ~~LOC~~ SOLN
12.0000 [IU] | Freq: Every day | SUBCUTANEOUS | Status: DC
  Administered 2019-05-01: 12 [IU] via SUBCUTANEOUS
  Filled 2019-04-30 (×3): qty 0.12

## 2019-04-30 MED ORDER — INSULIN DETEMIR 100 UNIT/ML ~~LOC~~ SOLN
26.0000 [IU] | Freq: Every morning | SUBCUTANEOUS | Status: DC
  Administered 2019-05-01 – 2019-05-02 (×2): 26 [IU] via SUBCUTANEOUS
  Filled 2019-04-30 (×2): qty 0.26

## 2019-04-30 MED ORDER — VITAMIN D 25 MCG (1000 UNIT) PO TABS
1000.0000 [IU] | ORAL_TABLET | Freq: Every day | ORAL | Status: DC
  Administered 2019-04-30 – 2019-05-02 (×3): 1000 [IU] via ORAL
  Filled 2019-04-30 (×3): qty 1

## 2019-04-30 MED ORDER — SODIUM CHLORIDE 0.9 % IV SOLN: INTRAVENOUS | Status: DC

## 2019-04-30 MED ORDER — PRENATAL PLUS 27-1 MG PO TABS
1.0000 | ORAL_TABLET | Freq: Every day | ORAL | Status: DC
  Filled 2019-04-30: qty 1

## 2019-04-30 MED ORDER — HYDROCOD POLST-CPM POLST ER 10-8 MG/5ML PO SUER: 5.0000 mL | Freq: Two times a day (BID) | ORAL | Status: DC | PRN

## 2019-04-30 MED ORDER — ONDANSETRON 4 MG PO TBDP: 4.0000 mg | ORAL_TABLET | Freq: Four times a day (QID) | ORAL | Status: DC | PRN

## 2019-04-30 MED ORDER — METOCLOPRAMIDE HCL 10 MG PO TABS
10.0000 mg | ORAL_TABLET | Freq: Three times a day (TID) | ORAL | Status: DC
  Administered 2019-04-30 – 2019-05-01 (×3): 10 mg via ORAL
  Filled 2019-04-30 (×6): qty 1

## 2019-04-30 MED ORDER — MAGNESIUM HYDROXIDE 400 MG/5ML PO SUSP: 30.0000 mL | Freq: Every day | ORAL | Status: DC | PRN

## 2019-04-30 MED ORDER — FAMOTIDINE 20 MG PO TABS
20.0000 mg | ORAL_TABLET | Freq: Two times a day (BID) | ORAL | Status: DC
  Administered 2019-04-30 – 2019-05-01 (×3): 20 mg via ORAL
  Filled 2019-04-30 (×4): qty 1

## 2019-04-30 NOTE — H&P (Addendum)
[place] at [facility]   PATIENT NAME: Madison Harrell    MR#:  [id number]  DATE OF BIRTH:  05-13-1990  DATE OF ADMISSION:  04/30/2019  PRIMARY CARE PHYSICIAN: Zhou-Talbert, Ralene Bathe, MD   REQUESTING/REFERRING PHYSICIAN: Enid Derry, PA-C  CHIEF COMPLAINT:   Chief Complaint  Patient presents with  . Fever  . Sore Throat  . Generalized Body Aches    HISTORY OF PRESENT ILLNESS:  Madison Harrell  is a 29 y.o. lethargic female with a known history of type diabetes mellitus and bipolar disorder, who presented to the emergency room with acute onset of generalized weakness and body aches as well as fever and chills with associated dyspnea and dry cough without wheezing for the last couple of day with associated mild sore throat and dizziness.  She denies any nausea or vomiting or diarrhea.  She denies any loss of taste or smell.  She admits to headache.  She was significantly lethargic in the ER.  No dysuria, oliguria or hematuria or flank pain.  Blood glucose has been elevated.  She has been having polyuria and polydipsia.  Upon presentation to the emergency room, temperature was 102.7 heart rate 130 with otherwise normal vital signs.  Labs revealed venous blood gas with bicarbonate of 20 and pH of 7.39 and CMP was remarkable for a blood glucose of 312 with anion gap of 16 and CO2 of 19.  We added LDH that came back 273 lactic acid was 0.9, ferritin 30 and beta hydroxybutyrate was 2.3 with negative urine pregnancy test.  Influenza AMB antigen came back negative and COVID-19 antigen test came back positive.  Urinalysis showed more than 500 glucose of more than 300 protein with 21-50 WBCs and 6-10 RBCs.  Group A strep by PCR came back negative.  Portable chest x-ray showed no acute cardiopulmonary disease.  Blood cultures were drawn.  The patient was given a gram of IV Rocephin and 10 mg IV Decadron, 600 mg of p.o. ibuprofen, IV remdesivir and NovoLog 5 units IV with 1 and half  liter of IV normal saline bolus.  She will be admitted to the medical monitored bed for further evaluation and management. PAST MEDICAL HISTORY:   Past Medical History:  Diagnosis Date  . Diabetes mellitus without complication (HCC)   . pre E   Bipolar disorder  PAST SURGICAL HISTORY:   Past Surgical History:  Procedure Laterality Date  . CESAREAN SECTION  2013    SOCIAL HISTORY:   Social History   Tobacco Use  . Smoking status: Former Smoker    Types: Cigarettes  . Smokeless tobacco: Never Used  Substance Use Topics  . Alcohol use: No    FAMILY HISTORY:   Family History  Problem Relation Age of Onset  . Diabetes Father     DRUG ALLERGIES:  No Known Allergies  REVIEW OF SYSTEMS:   ROS As per history of present illness. All pertinent systems were reviewed above. Constitutional,  HEENT, cardiovascular, respiratory, GI, GU, musculoskeletal, neuro, psychiatric, endocrine,  integumentary and hematologic systems were reviewed and are otherwise  negative/unremarkable except for positive findings mentioned above in the HPI.   MEDICATIONS AT HOME:   Prior to Admission medications   Medication Sig Start Date End Date Taking? Authorizing Provider  acetaminophen (TYLENOL) 500 MG tablet Take 2 tablets (1,000 mg total) by mouth every 6 (six) hours as needed for fever or headache. 10/01/18   Farrel Conners, CNM  Cholecalciferol (VITAMIN D3 PO) Take by  mouth.    [provider]  insulin aspart (NOVOLOG) 100 UNIT/ML injection Inject 10 Units into the skin 3 (three) times daily with meals.     [provider]  insulin NPH Human (NOVOLIN N) 100 UNIT/ML injection Inject 14 Units into the skin at bedtime.    [provider]  insulin NPH Human (NOVOLIN N) 100 UNIT/ML injection Inject 14 Units into the skin daily before breakfast.    [provider]  lithium 300 MG tablet Take 600 mg by mouth daily.    [provider]  metoCLOPramide  (REGLAN) 10 MG tablet Take 1 tablet (10 mg total) by mouth 4 (four) times daily -  before meals and at bedtime. 08/03/18   Gae Dry, MD  NORLYDA 0.35 MG tablet Take 1 tablet by mouth daily. 02/24/19   [provider]  ondansetron (ZOFRAN ODT) 4 MG disintegrating tablet Take 1 tablet (4 mg total) by mouth every 6 (six) hours as needed for nausea. 08/03/18   Gae Dry, MD  prenatal vitamin w/FE, FA (PRENATAL 1 + 1) 27-1 MG TABS tablet Take 1 tablet by mouth daily.     [provider]  sertraline (ZOLOFT) 50 MG tablet Take 50 mg by mouth daily.    [provider]      VITAL SIGNS:  Blood pressure 140/86, pulse (!) 115, temperature 99.5 F (37.5 C), temperature source Oral, resp. rate (!) 24, height 5' (1.524 m), weight 81.2 kg, SpO2 94 %, unknown if currently breastfeeding.  PHYSICAL EXAMINATION:  Physical Exam  GENERAL:  29 y.o.-year-old lethargic female patient lying in the bed with no acute distress.  EYES: Pupils equal, round, reactive to light and accommodation. No scleral icterus. Extraocular muscles intact.  HEENT: Head atraumatic, normocephalic. Oropharynx with slightly dry mucous membrane and tongue and nasopharynx clear.  NECK:  Supple, no jugular venous distention. No thyroid enlargement, no tenderness.  LUNGS: Normal breath sounds bilaterally, no wheezing, rales,rhonchi or crepitation. No use of accessory muscles of respiration.  CARDIOVASCULAR: Regular rate and rhythm, S1, S2 normal. No murmurs, rubs, or gallops.  ABDOMEN: Soft, nondistended, nontender. Bowel sounds present. No organomegaly or mass.  EXTREMITIES: No pedal edema, cyanosis, or clubbing.  NEUROLOGIC: Cranial nerves II through XII are intact. Muscle strength 5/5 in all extremities. Sensation intact. Gait not checked.  PSYCHIATRIC: The patient is alert and oriented x 3 but lethargic.  Normal affect and good eye contact. SKIN: No obvious rash, lesion, or ulcer.   LABORATORY PANEL:     CBC Recent Labs  Lab 04/30/19 1931  WBC 8.2  HGB 13.9  HCT 44.9  PLT 364   ------------------------------------------------------------------------------------------------------------------  Chemistries  Recent Labs  Lab 04/30/19 1931  NA 134*  K 4.0  CL 99  CO2 19*  GLUCOSE 312*  BUN 15  CREATININE 0.60  CALCIUM 9.0  AST 93*  ALT 137*  ALKPHOS 126  BILITOT 0.9   ------------------------------------------------------------------------------------------------------------------  Cardiac Enzymes No results for input(s): TROPONINI in the last 168 hours. ------------------------------------------------------------------------------------------------------------------  RADIOLOGY:  DG Chest Portable 1 View  Result Date: 04/30/2019 CLINICAL DATA:  Fever. EXAM: PORTABLE CHEST 1 VIEW COMPARISON:  08/13/2017 FINDINGS: The heart size and mediastinal contours are within normal limits. Both lungs are clear. The visualized skeletal structures are unremarkable. IMPRESSION: No active disease. Electronically Signed   By: Constance Holster M.D.   On: 04/30/2019 19:48      IMPRESSION AND PLAN:   1.  Sepsis, likely secondary to UTI.  Sepsis is manifested by tachycardia and high fever.  She has no severe sepsis or septic shock.  The patient will be admitted to a medical monitored bed.  She will be continued on hydration with IV normal saline and antibiotic therapy with IV Rocephin.  Will follow urine and blood cultures.  2.  COVID-19.  This is likely contributing to her sepsis and generalized weakness.  Given normal inflammatory markers so far I will hold off any further steroid therapy.  The patient already received the IV remdesivir which we will continue for the possibility of viremia especially given her generalized weakness and body aches.  We will place the patient on p.o. vitamin D3, zinc sulfate, p.o. Pepcid and aspirin  3.  Uncontrolled type 2 diabetes mellitus, on the verge of  DKA.  She received a bolus of IV normal saline and will complete 2 L bolus followed by 150 mL/h.  She was given 5 units of IV NovoLog and will continue with supplemental coverage with NovoLog with frequent fingerstick blood glucose measures.  4.  DVT prophylaxis.  Subcutaneous Lovenox   All the records are reviewed and case discussed with ED provider. The plan of care was discussed in details with the patient (and family). I answered all questions. The patient agreed to proceed with the above mentioned plan. Further management will depend upon hospital course.   CODE STATUS: Full code  TOTAL TIME TAKING CARE OF THIS PATIENT: 55 minutes.    Hannah Beat M.D on 04/30/2019 at 9:53 PM  [facility]   From 7 PM-7 AM, contact night-coverage [email or web address]  CC: Primary care physician; Zhou-Talbert, Ralene Bathe, MD   Note: This dictation was prepared with Dragon dictation along with smaller phrase technology. Any transcriptional errors that result from this process are unintentional.

## 2019-04-30 NOTE — ED Triage Notes (Signed)
Fever body aches and sore throat x 2 days.
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
## 2019-04-30 NOTE — Progress Notes (Signed)
Spoke w/Dr Arville Care and updated on CBG=278 w/pt receiving NS bolus.  Orders received to give 2200 Levemir and hold SSI until the next bolus is administered and complete and then recheck CBG and administer SSI to treat that result.

## 2019-04-30 NOTE — Progress Notes (Signed)
Remdesivir - Pharmacy Brief Note   O:  ALT:  137  CXR:  SpO2: % on    A/P:  Remdesivir 200 mg IVPB once followed by 100 mg IVPB daily x 4 days.   .me 04/30/2019 9:09 PM

## 2019-04-30 NOTE — ED Notes (Signed)
Pt here for body aches and fever

## 2019-05-01 ENCOUNTER — Encounter: Payer: Self-pay | Admitting: Family Medicine

## 2019-05-01 DIAGNOSIS — E0810 Diabetes mellitus due to underlying condition with ketoacidosis without coma: Secondary | ICD-10-CM

## 2019-05-01 DIAGNOSIS — U071 COVID-19: Secondary | ICD-10-CM | POA: Diagnosis present

## 2019-05-01 DIAGNOSIS — N390 Urinary tract infection, site not specified: Secondary | ICD-10-CM | POA: Diagnosis present

## 2019-05-01 DIAGNOSIS — R519 Headache, unspecified: Secondary | ICD-10-CM

## 2019-05-01 LAB — COMPREHENSIVE METABOLIC PANEL
ALT: 106 U/L — ABNORMAL HIGH (ref 0–44)
AST: 64 U/L — ABNORMAL HIGH (ref 15–41)
Albumin: 3.2 g/dL — ABNORMAL LOW (ref 3.5–5.0)
Alkaline Phosphatase: 90 U/L (ref 38–126)
Anion gap: 9 (ref 5–15)
BUN: 14 mg/dL (ref 6–20)
CO2: 18 mmol/L — ABNORMAL LOW (ref 22–32)
Calcium: 7.7 mg/dL — ABNORMAL LOW (ref 8.9–10.3)
Chloride: 109 mmol/L (ref 98–111)
Creatinine, Ser: <0.3 mg/dL — ABNORMAL LOW (ref 0.44–1.00)
Glucose, Bld: 236 mg/dL — ABNORMAL HIGH (ref 70–99)
Potassium: 3.4 mmol/L — ABNORMAL LOW (ref 3.5–5.1)
Sodium: 136 mmol/L (ref 135–145)
Total Bilirubin: 0.5 mg/dL (ref 0.3–1.2)
Total Protein: 6.8 g/dL (ref 6.5–8.1)

## 2019-05-01 LAB — GLUCOSE, CAPILLARY
Glucose-Capillary: 388 mg/dL — ABNORMAL HIGH (ref 70–99)
Glucose-Capillary: 223 mg/dL — ABNORMAL HIGH (ref 70–99)
Glucose-Capillary: 140 mg/dL — ABNORMAL HIGH (ref 70–99)
Glucose-Capillary: 223 mg/dL — ABNORMAL HIGH (ref 70–99)
Glucose-Capillary: 189 mg/dL — ABNORMAL HIGH (ref 70–99)

## 2019-05-01 LAB — C-REACTIVE PROTEIN
CRP: 2.5 mg/dL — ABNORMAL HIGH (ref <1.0)
CRP: 2.5 mg/dL — ABNORMAL HIGH (ref <1.0)

## 2019-05-01 LAB — CBC WITH DIFFERENTIAL/PLATELET
Abs Immature Granulocytes: 0.02 10*3/uL (ref 0.00–0.07)
Basophils Absolute: 0 10*3/uL (ref 0.0–0.1)
Basophils Relative: 0 %
Eosinophils Absolute: 0 10*3/uL (ref 0.0–0.5)
Eosinophils Relative: 0 %
HCT: 35.1 % — ABNORMAL LOW (ref 36.0–46.0)
Hemoglobin: 11.6 g/dL — ABNORMAL LOW (ref 12.0–15.0)
Immature Granulocytes: 1 %
Lymphocytes Relative: 23 %
Lymphs Abs: 0.9 10*3/uL (ref 0.7–4.0)
MCH: 22.7 pg — ABNORMAL LOW (ref 26.0–34.0)
MCHC: 33 g/dL (ref 30.0–36.0)
MCV: 68.7 fL — ABNORMAL LOW (ref 80.0–100.0)
Monocytes Absolute: 0.4 10*3/uL (ref 0.1–1.0)
Monocytes Relative: 11 %
Neutro Abs: 2.5 10*3/uL (ref 1.7–7.7)
Neutrophils Relative %: 65 %
Platelets: 309 10*3/uL (ref 150–400)
RBC: 5.11 MIL/uL (ref 3.87–5.11)
RDW: 18 % — ABNORMAL HIGH (ref 11.5–15.5)
Smear Review: NORMAL
WBC: 3.8 10*3/uL — ABNORMAL LOW (ref 4.0–10.5)
nRBC: 0 % (ref 0.0–0.2)

## 2019-05-01 LAB — ABO/RH: ABO/RH(D): O POS

## 2019-05-01 LAB — FERRITIN: Ferritin: 29 ng/mL (ref 11–307)

## 2019-05-01 LAB — D-DIMER, QUANTITATIVE: D-Dimer, Quant: 0.32 ug/mL-FEU (ref 0.00–0.50)

## 2019-05-01 LAB — HIV ANTIBODY (ROUTINE TESTING W REFLEX): HIV Screen 4th Generation wRfx: NONREACTIVE

## 2019-05-01 LAB — SARS CORONAVIRUS 2 (TAT 6-24 HRS): SARS Coronavirus 2: POSITIVE — AB

## 2019-05-01 MED ORDER — SODIUM CHLORIDE 0.9 % IV SOLN
1.0000 g | INTRAVENOUS | Status: DC
  Administered 2019-05-01: 1 g via INTRAVENOUS
  Filled 2019-05-01: qty 1
  Filled 2019-05-01 (×2): qty 10

## 2019-05-01 MED ORDER — BUTALBITAL-APAP-CAFFEINE 50-325-40 MG PO TABS: 1.0000 | ORAL_TABLET | Freq: Four times a day (QID) | ORAL | Status: DC | PRN

## 2019-05-01 MED ORDER — INFLUENZA VAC SPLIT QUAD 0.5 ML IM SUSY
0.5000 mL | PREFILLED_SYRINGE | INTRAMUSCULAR | Status: AC
  Administered 2019-05-02: 0.5 mL via INTRAMUSCULAR
  Filled 2019-05-01: qty 0.5

## 2019-05-01 NOTE — ED Notes (Signed)
Pt up to use bathroom 

## 2019-05-01 NOTE — Progress Notes (Addendum)
PROGRESS NOTE    Madison Harrell  IRC:[id number] DOB: [birth year] DOA: 04/30/2019  PCP: Tanna Furry, MD    LOS - 1   Brief Narrative:  29 y.o. lethargic female with a history of diabetes and bipolar disorder, who presented to the ED with acute onset of generalized weakness, body aches, fever/chills, dyspnea and dry cough without wheezing, mild sore throat and dizziness for the last couple of days.  Patient notes her blood sugar has been high, and reported polyuria and polydipsia.  In the ED, temp 102.7 F, HR 130.  Labs showed bicarb 19, pH 7.39, anion gap 16, glucose 312, normal lactate.  Beta hydroxybutyrate elevated.  UA consistent with UTI, also with >500 glucose and >300 protein.  Rapid group A strep negative.  COVID-19 POSITIVE.  Chest xray negative.  Patient treated with Rocephin, Decadron, remdesivir, insulin and IV fluids.  Admitted for further management of UTI, COVID-19 infection and near-DKA.   Subjective 1/3: Patient seen this AM in ED holding bed.  She reports a headache this morning, worse with the lights on.  Denies fever/chills, SOB or chest pain.  No GI symptoms.  Does report ongoing body aches.  No acute events reported since admission.   Assessment & Plan:   Active Problems:   COVID-19 virus infection   Acute lower UTI   Headache   DKA (diabetic ketoacidoses) (HCC)   Sepsis secondary to UTI. - as evidenced by fever, tachycardia and UA showing UTI.   No evidence of end organ damage or shock.  Normal lactate.  Received fluid resuscitation and prompt antibiotics.  --continue Rocephin until culture results --follow up urine and blood cultures --IV fluids, reduce to 75 cc/hr  COVID-19 Infection - likely contributing to sepsis, myalgias and weakness.   Inflammatory markers normal on admission with exception of mildly elevated CRP of 2.5.   --continue remdesivir per pharmacy --no steroid since no hypoxia --continue on vitamin C, D3 and  zinc --pepcid --aspirin --antitussive PRN  Headache - likely secondary to above --Fioricet PRN  Uncontrolled type 2 diabetes mellitus, on the verge of DKA.   Anion gap closed and glucose better controlled today.   --will continue resistant sliding scale Novolog --Novolog 10 units w breakfast, 6 units with dinner --check A1c with AM labs   DVT prophylaxis: Lovenox   Code Status: Full Code  Family Communication: none at bedside  Disposition Plan:  Expect discharge home in 1-2 days, pending clinical improvement   Consultants:   None  Procedures:   None  Antimicrobials:   Rocephin pending urine culture    Objective: Vitals:   04/30/19 2047 04/30/19 2336 05/01/19 0355 05/01/19 0906  BP: 140/86 114/77 112/76 97/65  Pulse: (!) 115 97  78  Resp: (!) 24 (!) 21 (!) 21 18  Temp: 99.5 F (37.5 C) 98.4 F (36.9 C) 97.9 F (36.6 C) 97.8 F (36.6 C)  TempSrc: Oral Oral Oral Oral  SpO2: 94% 100% 98% 98%  Weight:      Height:       No intake or output data in the 24 hours ending 05/01/19 1531 Filed Weights   04/30/19 1853  Weight: 81.2 kg    Examination:  General exam: awake, alert, no acute distress, appears uncomfortable eyes squinting HEENT: moist mucus membranes, hearing grossly normal  Respiratory system: clear to auscultation bilaterally, no wheezes, rales or rhonchi, normal respiratory effort. Cardiovascular system: normal S1/S2, RRR, no JVD, murmurs, rubs, gallops, no pedal edema.   Gastrointestinal system: soft,  non-tender, non-distended abdomen, normal bowel sounds. Central nervous system: alert and oriented x4. no gross focal neurologic deficits, normal speech Extremities: moves all, no edema, normal tone Skin: dry, intact, normal temperature Psychiatry: normal mood, congruent affect, judgement and insight appear normal    Data Reviewed: I have personally reviewed following labs and imaging studies  CBC: Recent Labs  Lab 04/30/19 1931  05/01/19 1353  WBC 8.2 3.8*  NEUTROABS 6.3 2.5  HGB 13.9 11.6*  HCT 44.9 35.1*  MCV 72.4* 68.7*  PLT 364 309   Basic Metabolic Panel: Recent Labs  Lab 04/30/19 1931 05/01/19 1353  NA 134* 136  K 4.0 3.4*  CL 99 109  CO2 19* 18*  GLUCOSE 312* 236*  BUN 15 14  CREATININE 0.60 <0.30*  CALCIUM 9.0 7.7*   GFR: CrCl cannot be calculated (This lab value cannot be used to calculate CrCl because it is not a number: <0.30). Liver Function Tests: Recent Labs  Lab 04/30/19 1931 05/01/19 1353  AST 93* 64*  ALT 137* 106*  ALKPHOS 126 90  BILITOT 0.9 0.5  PROT 8.9* 6.8  ALBUMIN 4.6 3.2*   No results for input(s): LIPASE, AMYLASE in the last 168 hours. No results for input(s): AMMONIA in the last 168 hours. Coagulation Profile: No results for input(s): INR, PROTIME in the last 168 hours. Cardiac Enzymes: No results for input(s): CKTOTAL, CKMB, CKMBINDEX, TROPONINI in the last 168 hours. BNP (last 3 results) No results for input(s): PROBNP in the last 8760 hours. HbA1C: No results for input(s): HGBA1C in the last 72 hours. CBG: Recent Labs  Lab 04/30/19 2139 04/30/19 2244 05/01/19 0218 05/01/19 0904 05/01/19 1241  GLUCAP 350* 278* 388* 223* 140*   Lipid Profile: No results for input(s): CHOL, HDL, LDLCALC, TRIG, CHOLHDL, LDLDIRECT in the last 72 hours. Thyroid Function Tests: No results for input(s): TSH, T4TOTAL, FREET4, T3FREE, THYROIDAB in the last 72 hours. Anemia Panel: Recent Labs    04/30/19 1931 05/01/19 1353  FERRITIN 30 29   Sepsis Labs: Recent Labs  Lab 04/30/19 2050  LATICACIDVEN 0.9    Recent Results (from the past 240 hour(s))  Group A Strep by PCR ([facility] Only)     Status: None   Collection Time: 04/30/19  7:31 PM   Specimen: Throat; Sterile Swab  Result Value Ref Range Status   Group A Strep by PCR NOT DETECTED NOT DETECTED Final    Comment: Performed at [facility], [address] [place], [place] [address]  SARS CORONAVIRUS  2 (TAT 6-24 HRS) Nasopharyngeal Throat     Status: Abnormal   Collection Time: 04/30/19  7:31 PM   Specimen: Throat; Nasopharyngeal  Result Value Ref Range Status   SARS Coronavirus 2 POSITIVE (A) NEGATIVE Final    Comment: RESULT CALLED TO, READ BACK BY AND VERIFIED WITH: C. MCFAIL,RN [id number] 05/01/2019 T. TYSOR (NOTE) SARS-CoV-2 target nucleic acids are DETECTED. The SARS-CoV-2 RNA is generally detectable in upper and lower respiratory specimens during the acute phase of infection. Positive results are indicative of the presence of SARS-CoV-2 RNA. Clinical correlation with patient history and other diagnostic information is  necessary to determine patient infection status. Positive results do not rule out bacterial infection or co-infection with other viruses.  The expected result is Negative. Fact Sheet for Patients: HairSlick.no Fact Sheet for Healthcare Providers: quierodirigir.com This test is not yet approved or cleared by the [place] FDA and  has been authorized for detection and/or diagnosis of SARS-CoV-2 by FDA under  an Emergency Use Authorization (EUA). This EUA will remain  in effect (meaning this test can be used) for t he duration of the COVID-19 declaration under Section 564(b)(1) of the Act, 21 U.S.C. section 360bbb-3(b)(1), unless the authorization is terminated or revoked sooner. Performed at [facility], [place] [address] [place], [place] [address]   Culture, blood (Routine X 2) w Reflex to ID Panel     Status: None (Preliminary result)   Collection Time: 04/30/19 11:06 PM   Specimen: BLOOD  Result Value Ref Range Status   Specimen Description BLOOD RIGHT HAND  Final   Special Requests   Final    BOTTLES DRAWN AEROBIC AND ANAEROBIC Blood Culture adequate volume   Culture   Final    NO GROWTH < 12 HOURS Performed at [facility], [address], [place], [place] [address]    Report Status  PENDING  Incomplete  Culture, blood (Routine X 2) w Reflex to ID Panel     Status: None (Preliminary result)   Collection Time: 04/30/19 11:38 PM   Specimen: BLOOD  Result Value Ref Range Status   Specimen Description BLOOD RIGHT ANTECUBITAL  Final   Special Requests   Final    BOTTLES DRAWN AEROBIC AND ANAEROBIC Blood Culture adequate volume   Culture   Final    NO GROWTH < 12 HOURS Performed at [facility], [address], [place], [place] [address]    Report Status PENDING  Incomplete         Radiology Studies: DG Chest Portable 1 View  Result Date: 04/30/2019 CLINICAL DATA:  Fever. EXAM: PORTABLE CHEST 1 VIEW COMPARISON:  08/13/2017 FINDINGS: The heart size and mediastinal contours are within normal limits. Both lungs are clear. The visualized skeletal structures are unremarkable. IMPRESSION: No active disease. Electronically Signed   By: Constance Holster M.D.   On: 04/30/2019 19:48        Scheduled Meds: . aspirin EC  81 mg Oral Daily  . cholecalciferol  1,000 Units Oral Daily  . enoxaparin (LOVENOX) injection  40 mg Subcutaneous Q24H  . famotidine  20 mg Oral BID  . guaiFENesin  600 mg Oral BID  . insulin aspart  0-20 Units Subcutaneous Q4H  . insulin aspart  10 Units Subcutaneous Q breakfast   And  . insulin aspart  6 Units Subcutaneous Q supper  . insulin detemir  26 Units Subcutaneous q morning - 10a   And  . insulin detemir  12 Units Subcutaneous QHS  . metoCLOPramide  10 mg Oral TID AC & HS  . zinc sulfate  220 mg Oral Daily   Continuous Infusions: . sodium chloride 150 mL/hr at 05/01/19 1035  . cefTRIAXone (ROCEPHIN)  IV    . remdesivir 100 mg in NS 100 mL Stopped (05/01/19 1049)     LOS: 1 day    Time spent: 35 minutes    Ezekiel Slocumb, DO [facility]   If 7PM-7AM, please contact night-coverage [email or web address] Password [facility] 05/01/2019, 3:31 PM

## 2019-05-01 NOTE — ED Notes (Signed)
RN attempted blood draw x 2

## 2019-05-01 NOTE — ED Notes (Signed)
Report given to Annie, RN

## 2019-05-01 NOTE — ED Notes (Signed)
Lab called again to come drawn patients labs due to pt being difficult stick

## 2019-05-02 LAB — CBC WITH DIFFERENTIAL/PLATELET
Abs Immature Granulocytes: 0.02 10*3/uL (ref 0.00–0.07)
Basophils Absolute: 0 10*3/uL (ref 0.0–0.1)
Basophils Relative: 0 %
Eosinophils Absolute: 0 10*3/uL (ref 0.0–0.5)
Eosinophils Relative: 0 %
HCT: 35.6 % — ABNORMAL LOW (ref 36.0–46.0)
Hemoglobin: 11.7 g/dL — ABNORMAL LOW (ref 12.0–15.0)
Immature Granulocytes: 0 %
Lymphocytes Relative: 44 %
Lymphs Abs: 2.2 10*3/uL (ref 0.7–4.0)
MCH: 22.5 pg — ABNORMAL LOW (ref 26.0–34.0)
MCHC: 32.9 g/dL (ref 30.0–36.0)
MCV: 68.5 fL — ABNORMAL LOW (ref 80.0–100.0)
Monocytes Absolute: 0.4 10*3/uL (ref 0.1–1.0)
Monocytes Relative: 8 %
Neutro Abs: 2.3 10*3/uL (ref 1.7–7.7)
Neutrophils Relative %: 48 %
Platelets: 313 10*3/uL (ref 150–400)
RBC: 5.2 MIL/uL — ABNORMAL HIGH (ref 3.87–5.11)
RDW: 18.4 % — ABNORMAL HIGH (ref 11.5–15.5)
Smear Review: NORMAL
WBC: 4.9 10*3/uL (ref 4.0–10.5)
nRBC: 0 % (ref 0.0–0.2)

## 2019-05-02 LAB — GLUCOSE, CAPILLARY
Glucose-Capillary: 121 mg/dL — ABNORMAL HIGH (ref 70–99)
Glucose-Capillary: 155 mg/dL — ABNORMAL HIGH (ref 70–99)
Glucose-Capillary: 159 mg/dL — ABNORMAL HIGH (ref 70–99)

## 2019-05-02 LAB — COMPREHENSIVE METABOLIC PANEL
ALT: 97 U/L — ABNORMAL HIGH (ref 0–44)
AST: 61 U/L — ABNORMAL HIGH (ref 15–41)
Albumin: 3 g/dL — ABNORMAL LOW (ref 3.5–5.0)
Alkaline Phosphatase: 83 U/L (ref 38–126)
Anion gap: 10 (ref 5–15)
BUN: 17 mg/dL (ref 6–20)
CO2: 22 mmol/L (ref 22–32)
Calcium: 8 mg/dL — ABNORMAL LOW (ref 8.9–10.3)
Chloride: 106 mmol/L (ref 98–111)
Creatinine, Ser: 0.35 mg/dL — ABNORMAL LOW (ref 0.44–1.00)
GFR calc Af Amer: >60 mL/min (ref >60)
GFR calc non Af Amer: >60 mL/min (ref >60)
Glucose, Bld: 142 mg/dL — ABNORMAL HIGH (ref 70–99)
Potassium: 3.3 mmol/L — ABNORMAL LOW (ref 3.5–5.1)
Sodium: 138 mmol/L (ref 135–145)
Total Bilirubin: 0.4 mg/dL (ref 0.3–1.2)
Total Protein: 6.4 g/dL — ABNORMAL LOW (ref 6.5–8.1)

## 2019-05-02 LAB — D-DIMER, QUANTITATIVE
D-Dimer, Quant: <0.27 ug/mL-FEU (ref 0.00–0.50)
D-Dimer, Quant: <0.27 ug/mL-FEU (ref 0.00–0.50)

## 2019-05-02 LAB — C-REACTIVE PROTEIN: CRP: 1.5 mg/dL — ABNORMAL HIGH (ref <1.0)

## 2019-05-02 LAB — FERRITIN: Ferritin: 31 ng/mL (ref 11–307)

## 2019-05-02 MED ORDER — POTASSIUM CHLORIDE CRYS ER 20 MEQ PO TBCR
40.0000 meq | EXTENDED_RELEASE_TABLET | ORAL | Status: AC
  Administered 2019-05-02 (×2): 40 meq via ORAL
  Filled 2019-05-02 (×2): qty 2

## 2019-05-02 MED ORDER — GUAIFENESIN-DM 100-10 MG/5ML PO SYRP: 10.0000 mL | ORAL_SOLUTION | ORAL | 0 refills | Status: DC | PRN

## 2019-05-02 MED ORDER — CEFDINIR 300 MG PO CAPS: 300.0000 mg | ORAL_CAPSULE | Freq: Two times a day (BID) | ORAL | 0 refills | Status: DC

## 2019-05-02 MED ORDER — METOCLOPRAMIDE HCL 10 MG PO TABS: 10.0000 mg | ORAL_TABLET | Freq: Three times a day (TID) | ORAL | 1 refills | Status: DC

## 2019-05-02 NOTE — Progress Notes (Signed)
Patient scheduled for outpatient Remdesivir infusion at 1130AM on Tuesday 1/5 and Wednesday 1/6. Please advise them to report to [facility] at [address].  Drive to the security guard and tell them you are here for an infusion. They will direct you to the front entrance where we will come and get you.  For questions call [phone].  Thanks

## 2019-05-02 NOTE — Discharge Summary (Signed)
Physician Discharge Summary  Madison SladeJessica Harrell ZOX:[id number] DOB: 01/14/1991 DOA: 04/30/2019  PCP: Tanna FurryZhou-Talbert, Serena S, MD  Admit date: 04/30/2019 Discharge date: 05/02/2019  Admitted From: Home Disposition: Home  Recommendations for Outpatient Follow-up:  1. Follow up with PCP in 1-2 weeks 2. Please obtain BMP/CBC in one week   Home Health: No Equipment/Devices: None  Discharge Condition: Stable CODE STATUS: Full Diet recommendation: Carb modified  Brief/Interim Summary:  29 y.o.lethargic femalewith a history of diabetes and bipolar disorder, who presented to the ED with acute onset of generalized weakness, body aches, fever/chills, dyspnea and dry cough without wheezing, mild sore throat and dizziness for the last couple of days.  Patient notes her blood sugar has been high, and reported polyuria and polydipsia.  In the ED, temp 102.7 F, HR 130.  Labs showed bicarb 19, pH 7.39, anion gap 16, glucose 312, normal lactate.  Beta hydroxybutyrate elevated.  UA consistent with UTI, also with >500 glucose and >300 protein.  Rapid group A strep negative.  COVID-19 POSITIVE.  Chest xray negative.  Patient treated with Rocephin, Decadron, remdesivir, insulin and IV fluids.  Admitted for further management of UTI, COVID-19 infection and near-DKA.  UTI was treated with empiric Rocephin, and urine culture appears not to have been done.  Patient asymptomatic for urinary symptoms upon discharge.  She was treated with remdesivir for COVID-19 infection, and will have two doses remaining which have been scheduled to be administered at the infusion clinic [facility].  Patient's hyperglycemia improved with subcutaneous insulin, was not in full DKA and did not require insulin drip.  Her symptoms improved significantly and she requested to be discharged home.  Discussed isolation and precautions at home and patient expressed understanding.  Stable for discharge home today.   Discharge  Diagnoses: Active Problems:   COVID-19 virus infection   Acute lower UTI   Headache   DKA (diabetic ketoacidoses) (HCC)  Sepsis secondary to UTI. - as evidenced by fever, tachycardia and UA showing UTI.   No evidence of end organ damage or shock.  Normal lactate.  Received fluid resuscitation and prompt antibiotics. Treated with Rocephin.  Urine culture not sent.  Discharged with Omnicef to complete course.  COVID-19 Infection - likely contributing to sepsis, myalgias and weakness.  Inflammatory markers normal on admission with exception of mildly elevated CRP of 2.5.   --will receive last 2 doses remdesivir at infusion clinic  --no steroid since no hypoxia   Headache - likely secondary to above - resolved --Fioricet PRN  Uncontrolled type 2 diabetes mellitus, on the verge of DKA.  Anion gap closed and glucose better controlled today.   --will continue resistant sliding scale Novolog --Novolog 10 units w breakfast, 6 units with dinner --check A1c with AM labs   Discharge Instructions   Discharge Instructions    Call MD for:  extreme fatigue   Complete by: As directed    Call MD for:  temperature >100.4   Complete by: As directed    Diet - low sodium heart healthy   Complete by: As directed    Discharge instructions   Complete by: As directed    Please [facility] [place] for last two doses of remdesivir, tomorrow and Wednesday. Times are to be scheduled and you will be notified what time.   Increase activity slowly   Complete by: As directed      Allergies as of 05/02/2019   No Known Allergies  Medication List    TAKE these medications   cefdinir 300 MG capsule Commonly known as: OMNICEF Take 1 capsule (300 mg total) by mouth 2 (two) times daily.   guaiFENesin-dextromethorphan 100-10 MG/5ML syrup Commonly known as: ROBITUSSIN DM Take 10 mLs by mouth every 4 (four) hours as needed for cough.   insulin aspart 100 UNIT/ML  injection Commonly known as: novoLOG Inject 10 Units into the skin 3 (three) times daily with meals.   insulin NPH Human 100 UNIT/ML injection Commonly known as: NOVOLIN N Inject 12-26 Units into the skin 2 (two) times daily before a meal. 26 units qam 12 units qpm   insulin regular 100 units/mL injection Commonly known as: NOVOLIN R Inject 6-10 Units into the skin 2 (two) times daily before a meal. 10 units qam 6 unit qpm   lithium 300 MG tablet Take 600 mg by mouth daily.   metFORMIN 1000 MG tablet Commonly known as: GLUCOPHAGE Take 1,000 mg by mouth 2 (two) times daily with a meal.   metoCLOPramide 10 MG tablet Commonly known as: REGLAN Take 1 tablet (10 mg total) by mouth 4 (four) times daily -  before meals and at bedtime.   Norlyda 0.35 MG tablet Generic drug: norethindrone Take 1 tablet by mouth daily.   sertraline 50 MG tablet Commonly known as: ZOLOFT Take 50 mg by mouth daily.      Follow-up Information    [facility] Follow up.   Why: For last 2 remdesivir infusions, tomorrow and Wednesday. Contact information: [place] [address] [phone]       Alfonse Flavors, MD. Schedule an appointment as soon as possible for a visit in 1 week(s).   Specialty: Family Medicine Contact information: [place] [address] [phone]          No Known Allergies  Consultations:  None    Procedures/Studies: DG Chest Portable 1 View  Result Date: 04/30/2019 CLINICAL DATA:  Fever. EXAM: PORTABLE CHEST 1 VIEW COMPARISON:  08/13/2017 FINDINGS: The heart size and mediastinal contours are within normal limits. Both lungs are clear. The visualized skeletal structures are unremarkable. IMPRESSION: No active disease. Electronically Signed   By: Constance Holster M.D.   On: 04/30/2019 19:48     Subjective: Patient seen this AM.  Says she's feeling much better today, and asks if she  can go home.  No headache, fevers or chills.  No new or worsening symptoms.  Denies dysuria or urinary frequency worse than her baseline.  No acute events reported.    Discharge Exam: Vitals:   05/02/19 0403 05/02/19 0743  BP: 112/79 111/69  Pulse: 91 89  Resp: 20   Temp: 98.8 F (37.1 C) 98.4 F (36.9 C)  SpO2: 98% 100%   Vitals:   05/01/19 1800 05/01/19 2008 05/02/19 0403 05/02/19 0743  BP: 110/67 105/82 112/79 111/69  Pulse: 76 80 91 89  Resp: 18 18 20    Temp: 98 F (36.7 C) 98 F (36.7 C) 98.8 F (37.1 C) 98.4 F (36.9 C)  TempSrc: Oral Oral Oral Oral  SpO2: 97% 99% 98% 100%  Weight:   76.3 kg   Height:        General: Pt is alert, awake, not in acute distress Cardiovascular: RRR, S1/S2 +, no rubs, no gallops Respiratory: CTA bilaterally, no wheezing, no rhonchi Abdominal: Soft, NT, ND, bowel sounds + Extremities: no edema, no cyanosis    The results of  significant diagnostics from this hospitalization (including imaging, microbiology, ancillary and laboratory) are listed below for reference.     Microbiology: Recent Results (from the past 240 hour(s))  Group A Strep by PCR ([facility] Only)     Status: None   Collection Time: 04/30/19  7:31 PM   Specimen: Throat; Sterile Swab  Result Value Ref Range Status   Group A Strep by PCR NOT DETECTED NOT DETECTED Final    Comment: Performed at [facility], [address] [place], [place] [address]  SARS CORONAVIRUS 2 (TAT 6-24 HRS) Nasopharyngeal Throat     Status: Abnormal   Collection Time: 04/30/19  7:31 PM   Specimen: Throat; Nasopharyngeal  Result Value Ref Range Status   SARS Coronavirus 2 POSITIVE (A) NEGATIVE Final    Comment: RESULT CALLED TO, READ BACK BY AND VERIFIED WITH: C. MCFAIL,RN [id number] 05/01/2019 T. TYSOR (NOTE) SARS-CoV-2 target nucleic acids are DETECTED. The SARS-CoV-2 RNA is generally detectable in upper and lower respiratory specimens during the acute phase of infection.  Positive results are indicative of the presence of SARS-CoV-2 RNA. Clinical correlation with patient history and other diagnostic information is  necessary to determine patient infection status. Positive results do not rule out bacterial infection or co-infection with other viruses.  The expected result is Negative. Fact Sheet for Patients: HairSlick.no Fact Sheet for Healthcare Providers: quierodirigir.com This test is not yet approved or cleared by the [place] FDA and  has been authorized for detection and/or diagnosis of SARS-CoV-2 by FDA under an Emergency Use Authorization (EUA). This EUA will remain  in effect (meaning this test can be used) for t he duration of the COVID-19 declaration under Section 564(b)(1) of the Act, 21 U.S.C. section 360bbb-3(b)(1), unless the authorization is terminated or revoked sooner. Performed at [facility], [address], [place], [place] [address]   Culture, blood (Routine X 2) w Reflex to ID Panel     Status: None (Preliminary result)   Collection Time: 04/30/19 11:06 PM   Specimen: BLOOD  Result Value Ref Range Status   Specimen Description BLOOD RIGHT HAND  Final   Special Requests   Final    BOTTLES DRAWN AEROBIC AND ANAEROBIC Blood Culture adequate volume   Culture   Final    NO GROWTH 2 DAYS Performed at [facility], [address] [place], [place] [address]    Report Status PENDING  Incomplete  Culture, blood (Routine X 2) w Reflex to ID Panel     Status: None (Preliminary result)   Collection Time: 04/30/19 11:38 PM   Specimen: BLOOD  Result Value Ref Range Status   Specimen Description BLOOD RIGHT ANTECUBITAL  Final   Special Requests   Final    BOTTLES DRAWN AEROBIC AND ANAEROBIC Blood Culture adequate volume   Culture   Final    NO GROWTH 2 DAYS Performed at [facility], [address] [place], [place] [address]    Report Status PENDING   Incomplete     Labs: BNP (last 3 results) No results for input(s): BNP in the last 8760 hours. Basic Metabolic Panel: Recent Labs  Lab 04/30/19 1931 05/01/19 1353 05/02/19 0544  NA 134* 136 138  K 4.0 3.4* 3.3*  CL 99 109 106  CO2 19* 18* 22  GLUCOSE 312* 236* 142*  BUN 15 14 17   CREATININE 0.60 <0.30* 0.35*  CALCIUM 9.0 7.7* 8.0*   Liver Function Tests: Recent Labs  Lab 04/30/19 1931 05/01/19 1353  05/02/19 0544  AST 93* 64* 61*  ALT 137* 106* 97*  ALKPHOS 126 90 83  BILITOT 0.9 0.5 0.4  PROT 8.9* 6.8 6.4*  ALBUMIN 4.6 3.2* 3.0*   No results for input(s): LIPASE, AMYLASE in the last 168 hours. No results for input(s): AMMONIA in the last 168 hours. CBC: Recent Labs  Lab 04/30/19 1931 05/01/19 1353 05/02/19 0544  WBC 8.2 3.8* 4.9  NEUTROABS 6.3 2.5 2.3  HGB 13.9 11.6* 11.7*  HCT 44.9 35.1* 35.6*  MCV 72.4* 68.7* 68.5*  PLT 364 309 313   Cardiac Enzymes: No results for input(s): CKTOTAL, CKMB, CKMBINDEX, TROPONINI in the last 168 hours. BNP: Invalid input(s): POCBNP CBG: Recent Labs  Lab 05/01/19 1658 05/01/19 2010 05/02/19 0003 05/02/19 0401 05/02/19 0744  GLUCAP 223* 189* 159* 155* 121*   D-Dimer Recent Labs    04/30/19 2144 05/01/19 1353  DDIMER 0.32 <0.27   Hgb A1c No results for input(s): HGBA1C in the last 72 hours. Lipid Profile No results for input(s): CHOL, HDL, LDLCALC, TRIG, CHOLHDL, LDLDIRECT in the last 72 hours. Thyroid function studies No results for input(s): TSH, T4TOTAL, T3FREE, THYROIDAB in the last 72 hours.  Invalid input(s): FREET3 Anemia work up Recent Labs    05/01/19 1353 05/02/19 0544  FERRITIN 29 31   Urinalysis    Component Value Date/Time   COLORURINE YELLOW (A) 04/30/2019 1931   APPEARANCEUR CLOUDY (A) 04/30/2019 1931   APPEARANCEUR Hazy 09/23/2013 0918   LABSPEC 1.039 (H) 04/30/2019 1931   LABSPEC 1.031 09/23/2013 0918   PHURINE 5.0 04/30/2019 1931   GLUCOSEU >=500 (A) 04/30/2019 1931    GLUCOSEU >=500 09/23/2013 0918   HGBUR SMALL (A) 04/30/2019 1931   BILIRUBINUR NEGATIVE 04/30/2019 1931   BILIRUBINUR Negative 09/23/2013 0918   KETONESUR 80 (A) 04/30/2019 1931   PROTEINUR >=300 (A) 04/30/2019 1931   NITRITE NEGATIVE 04/30/2019 1931   LEUKOCYTESUR TRACE (A) 04/30/2019 1931   LEUKOCYTESUR 3+ 09/23/2013 0918   Sepsis Labs Invalid input(s): PROCALCITONIN,  WBC,  LACTICIDVEN Microbiology Recent Results (from the past 240 hour(s))  Group A Strep by PCR ([facility] Only)     Status: None   Collection Time: 04/30/19  7:31 PM   Specimen: Throat; Sterile Swab  Result Value Ref Range Status   Group A Strep by PCR NOT DETECTED NOT DETECTED Final    Comment: Performed at [facility] [address] [place], [place] [address]  SARS CORONAVIRUS 2 (TAT 6-24 HRS) Nasopharyngeal Throat     Status: Abnormal   Collection Time: 04/30/19  7:31 PM   Specimen: Throat; Nasopharyngeal  Result Value Ref Range Status   SARS Coronavirus 2 POSITIVE (A) NEGATIVE Final    Comment: RESULT CALLED TO, READ BACK BY AND VERIFIED WITH: C. MCFAIL,RN [id number] 05/01/2019 T. TYSOR (NOTE) SARS-CoV-2 target nucleic acids are DETECTED. The SARS-CoV-2 RNA is generally detectable in upper and lower respiratory specimens during the acute phase of infection. Positive results are indicative of the presence of SARS-CoV-2 RNA. Clinical correlation with patient history and other diagnostic information is  necessary to determine patient infection status. Positive results do not rule out bacterial infection or co-infection with other viruses.  The expected result is Negative. Fact Sheet for Patients: HairSlick.no Fact Sheet for Healthcare Providers: quierodirigir.com This test is not yet approved or cleared by the [place] FDA and  has been authorized for detection and/or diagnosis of SARS-CoV-2 by FDA under an Emergency Use Authorization (EUA). This  EUA will remain  in  effect (meaning this test can be used) for t he duration of the COVID-19 declaration under Section 564(b)(1) of the Act, 21 U.S.C. section 360bbb-3(b)(1), unless the authorization is terminated or revoked sooner. Performed at [facility], [address] [place], [place] [address]   Culture, blood (Routine X 2) w Reflex to ID Panel     Status: None (Preliminary result)   Collection Time: 04/30/19 11:06 PM   Specimen: BLOOD  Result Value Ref Range Status   Specimen Description BLOOD RIGHT HAND  Final   Special Requests   Final    BOTTLES DRAWN AEROBIC AND ANAEROBIC Blood Culture adequate volume   Culture   Final    NO GROWTH 2 DAYS Performed at [facility], [address], [place], [place] [address]    Report Status PENDING  Incomplete  Culture, blood (Routine X 2) w Reflex to ID Panel     Status: None (Preliminary result)   Collection Time: 04/30/19 11:38 PM   Specimen: BLOOD  Result Value Ref Range Status   Specimen Description BLOOD RIGHT ANTECUBITAL  Final   Special Requests   Final    BOTTLES DRAWN AEROBIC AND ANAEROBIC Blood Culture adequate volume   Culture   Final    NO GROWTH 2 DAYS Performed at [facility], [address] [place], [place] [address]    Report Status PENDING  Incomplete     Time coordinating discharge: Over 30 minutes  SIGNED:   Pennie Banter, DO [facility] 05/02/2019, 9:54 AM   If 7PM-7AM, please contact night-coverage [email or web address] Password [email or web address]

## 2019-05-02 NOTE — Plan of Care (Signed)
  Problem: Education: Goal: Knowledge of General Education information will improve Description: Including pain rating scale, medication(s)/side effects and non-pharmacologic comfort measures Outcome: Adequate for Discharge   

## 2019-05-02 NOTE — Discharge Instructions (Signed)
You are scheduled for an outpatient infusion of Remdesivir at 1130AM on Tuesday 1/5 and Wednesday 1/6.  Please report to [facility] at [address].  Drive to the security guard and tell them you are here for an infusion. They will direct you to the front entrance where we will come and get you.  For questions call [phone].  Thanks

## 2019-05-02 NOTE — Progress Notes (Addendum)
Patient given discharge instructions with work note and covid instructions. IV taken out and tele monitor off. Patient verbalized understanding without any questions or concerns. Husband will be picking up.   Flu shot given to patient before discharge.

## 2019-05-02 NOTE — Progress Notes (Signed)
Patient resting in bed without any complaints. Eating breakfast. IV fluids infusing. Educated on laying down in prone position when able.

## 2019-05-03 ENCOUNTER — Ambulatory Visit (HOSPITAL_COMMUNITY)
Admission: RE | Admit: 2019-05-03 | Discharge: 2019-05-03 | Disposition: A | Discharge status: Home / Self Care | Payer: BC Managed Care – PPO | Source: Ambulatory Visit | Attending: Pulmonary Disease | Admitting: Pulmonary Disease

## 2019-05-03 DIAGNOSIS — U071 COVID-19: Secondary | ICD-10-CM | POA: Insufficient documentation

## 2019-05-03 LAB — HEMOGLOBIN A1C
Hgb A1c MFr Bld: 12 % — ABNORMAL HIGH (ref 4.8–5.6)
Mean Plasma Glucose: 298 mg/dL

## 2019-05-03 MED ORDER — DIPHENHYDRAMINE HCL 50 MG/ML IJ SOLN: 50.0000 mg | Freq: Once | INTRAMUSCULAR | Status: DC | PRN

## 2019-05-03 MED ORDER — EPINEPHRINE 0.3 MG/0.3ML IJ SOAJ: 0.3000 mg | Freq: Once | INTRAMUSCULAR | Status: DC | PRN

## 2019-05-03 MED ORDER — ALBUTEROL SULFATE HFA 108 (90 BASE) MCG/ACT IN AERS: 2.0000 | INHALATION_SPRAY | Freq: Once | RESPIRATORY_TRACT | Status: DC | PRN

## 2019-05-03 MED ORDER — FAMOTIDINE IN NACL 20-0.9 MG/50ML-% IV SOLN: 20.0000 mg | Freq: Once | INTRAVENOUS | Status: DC | PRN

## 2019-05-03 MED ORDER — METHYLPREDNISOLONE SODIUM SUCC 125 MG IJ SOLR: 125.0000 mg | Freq: Once | INTRAMUSCULAR | Status: DC | PRN

## 2019-05-03 MED ORDER — SODIUM CHLORIDE 0.9 % IV SOLN
INTRAVENOUS | Status: DC | PRN
  Administered 2019-05-03: 12:00:00 250 mL via INTRAVENOUS

## 2019-05-03 MED ORDER — SODIUM CHLORIDE 0.9 % IV SOLN
100.0000 mg | Freq: Once | INTRAVENOUS | Status: AC
  Administered 2019-05-03: 12:00:00 100 mg via INTRAVENOUS

## 2019-05-03 MED ORDER — SODIUM CHLORIDE 0.9 % IV SOLN: INTRAVENOUS | Status: DC | PRN

## 2019-05-03 MED ORDER — SODIUM CHLORIDE 0.9 % IV SOLN: 100.0000 mg | Freq: Once | INTRAVENOUS | Status: DC

## 2019-05-03 MED ORDER — SODIUM CHLORIDE 0.9 % IV SOLN
INTRAVENOUS | Status: AC
  Filled 2019-05-03: qty 20

## 2019-05-03 NOTE — Progress Notes (Signed)
.   Diagnosis: COVID-19  Physician: Dr Delford Field  Procedure: [facility] Infusion Clinic Med: remdesivir infusion.  Complications: No immediate complications noted.  Discharge: Discharged home   Candie Mile 05/03/2019

## 2019-05-04 ENCOUNTER — Ambulatory Visit (HOSPITAL_COMMUNITY)
Admit: 2019-05-04 | Discharge: 2019-05-04 | Disposition: A | Discharge status: Home / Self Care | Payer: BC Managed Care – PPO | Attending: Pulmonary Disease | Admitting: Pulmonary Disease

## 2019-05-04 DIAGNOSIS — U071 COVID-19: Secondary | ICD-10-CM | POA: Diagnosis not present

## 2019-05-04 MED ORDER — SODIUM CHLORIDE 0.9 % IV SOLN: INTRAVENOUS | Status: DC | PRN

## 2019-05-04 MED ORDER — EPINEPHRINE 0.3 MG/0.3ML IJ SOAJ: 0.3000 mg | Freq: Once | INTRAMUSCULAR | Status: DC | PRN

## 2019-05-04 MED ORDER — SODIUM CHLORIDE 0.9 % IV SOLN
INTRAVENOUS | Status: AC
  Administered 2019-05-04: 100 mg via INTRAVENOUS
  Filled 2019-05-04: qty 20

## 2019-05-04 MED ORDER — FAMOTIDINE IN NACL 20-0.9 MG/50ML-% IV SOLN: 20.0000 mg | Freq: Once | INTRAVENOUS | Status: DC | PRN

## 2019-05-04 MED ORDER — SODIUM CHLORIDE 0.9 % IV SOLN: 100.0000 mg | Freq: Once | INTRAVENOUS | Status: AC

## 2019-05-04 MED ORDER — ALBUTEROL SULFATE HFA 108 (90 BASE) MCG/ACT IN AERS: 2.0000 | INHALATION_SPRAY | Freq: Once | RESPIRATORY_TRACT | Status: DC | PRN

## 2019-05-04 MED ORDER — SODIUM CHLORIDE 0.9 % IV SOLN
INTRAVENOUS | Status: AC
  Filled 2019-05-04: qty 20

## 2019-05-04 MED ORDER — METHYLPREDNISOLONE SODIUM SUCC 125 MG IJ SOLR: 125.0000 mg | Freq: Once | INTRAMUSCULAR | Status: DC | PRN

## 2019-05-04 MED ORDER — DIPHENHYDRAMINE HCL 50 MG/ML IJ SOLN: 50.0000 mg | Freq: Once | INTRAMUSCULAR | Status: DC | PRN

## 2019-05-04 NOTE — Progress Notes (Signed)
  Diagnosis: COVID-19  Physician: Dr. Wright  Procedure: Covid Infusion Clinic Med: remdesivir infusion.  Complications: No immediate complications noted.  Discharge: Discharged home   Madison Harrell 05/04/2019  

## 2019-05-05 LAB — CULTURE, BLOOD (ROUTINE X 2)
Culture: NO GROWTH
Culture: NO GROWTH
Special Requests: ADEQUATE
Special Requests: ADEQUATE

## 2019-09-19 ENCOUNTER — Encounter: Payer: Self-pay | Admitting: Obstetrics and Gynecology

## 2019-09-19 ENCOUNTER — Other Ambulatory Visit: Payer: Self-pay

## 2019-09-19 ENCOUNTER — Other Ambulatory Visit (HOSPITAL_COMMUNITY)
Admission: RE | Admit: 2019-09-19 | Discharge: 2019-09-19 | Disposition: A | Discharge status: Home / Self Care | Payer: BC Managed Care – PPO | Source: Ambulatory Visit | Attending: Obstetrics and Gynecology | Admitting: Obstetrics and Gynecology

## 2019-09-19 ENCOUNTER — Ambulatory Visit (INDEPENDENT_AMBULATORY_CARE_PROVIDER_SITE_OTHER): Payer: 59 | Admitting: Obstetrics and Gynecology

## 2019-09-19 VITALS — BP 110/84 | Ht 60.0 in | Wt 161.0 lb

## 2019-09-19 DIAGNOSIS — N912 Amenorrhea, unspecified: Secondary | ICD-10-CM

## 2019-09-19 DIAGNOSIS — Z01419 Encounter for gynecological examination (general) (routine) without abnormal findings: Secondary | ICD-10-CM | POA: Diagnosis not present

## 2019-09-19 DIAGNOSIS — Z3201 Encounter for pregnancy test, result positive: Secondary | ICD-10-CM

## 2019-09-19 DIAGNOSIS — Z124 Encounter for screening for malignant neoplasm of cervix: Secondary | ICD-10-CM

## 2019-09-19 DIAGNOSIS — Z113 Encounter for screening for infections with a predominantly sexual mode of transmission: Secondary | ICD-10-CM | POA: Diagnosis present

## 2019-09-19 DIAGNOSIS — N761 Subacute and chronic vaginitis: Secondary | ICD-10-CM | POA: Diagnosis not present

## 2019-09-19 LAB — POCT URINE PREGNANCY: Preg Test, Ur: POSITIVE — AB

## 2019-09-19 LAB — POCT WET PREP WITH KOH
Clue Cells Wet Prep HPF POC: NEGATIVE
KOH Prep POC: NEGATIVE
Trichomonas, UA: NEGATIVE
Yeast Wet Prep HPF POC: NEGATIVE

## 2019-09-19 MED ORDER — CLOTRIMAZOLE-BETAMETHASONE 1-0.05 % EX CREA: TOPICAL_CREAM | CUTANEOUS | 0 refills | Status: DC

## 2019-09-19 NOTE — Progress Notes (Signed)
PCP:  Tanna Furry, MD   Chief Complaint  Patient presents with  . Gynecologic Exam  . Vaginal Discharge    odor, itchiness, irritation x 2 weeks     HPI:      [name] is a 29 y.o. G1P0 who LMP was No LMP recorded. (Menstrual status: Irregular Periods)., presents today for her annual examination.  Her menses are irregular and have been since menarche. Periods can be monthly to Q3-4 months, lasting 4-7 days. LMP 10/20. Would like to get pregnant again. No recent  UPT done.  Did BC in past with some dysmen relief. Had IUD (kept moving), nexplanon (wt gain) and OCPs (no side effects).  Sex activity: single partner, contraception - none. Trying to conceive. Hx of habitual aborter but had baby boy last yr.  Last Pap: 09/02/17 Results: no abnormalities; no hx of abn Hx of STDs: none  There is no FH of breast cancer. There is no FH of ovarian cancer. The patient does self-breast exams.  Tobacco use: The patient denies current or previous tobacco use. Alcohol use: social drinker No drug use.  Exercise: moderately active  She does get adequate calcium and Vitamin D in her diet.  Labs with PCP. Has type 2 DM that is somewhat controlled. Pt c/o vaginal itching with increased d/c, odor for the past 2 wks. Tried monistat-1 and 3 last wk without relief. Hx of recurrent yeast vag sx. No prior abx use.    Past Medical History:  Diagnosis Date  . Diabetes mellitus without complication (HCC)   . pre E     Past Surgical History:  Procedure Laterality Date  . CESAREAN SECTION  2013    Family History  Problem Relation Age of Onset  . Diabetes Father     Social History   Socioeconomic History  . Marital status: Married    Spouse name: Not on file  . Number of children: 2  . Years of education: Not on file  . Highest education level: Not on file  Occupational History  . Not on file  Tobacco Use  . Smoking status: Former Smoker    Types: Cigarettes  .  Smokeless tobacco: Never Used  Substance and Sexual Activity  . Alcohol use: No  . Drug use: No  . Sexual activity: Yes    Birth control/protection: None  Other Topics Concern  . Not on file  Social History Narrative  . Not on file   Social Determinants of Health   Financial Resource Strain:   . Difficulty of Paying Living Expenses:   Food Insecurity:   . Worried About Programme researcher, broadcasting/film/video in the Last Year:   . Barista in the Last Year:   Transportation Needs:   . Freight forwarder (Medical):   Marland Kitchen Lack of Transportation (Non-Medical):   Physical Activity:   . Days of Exercise per Week:   . Minutes of Exercise per Session:   Stress:   . Feeling of Stress :   Social Connections:   . Frequency of Communication with Friends and Family:   . Frequency of Social Gatherings with Friends and Family:   . Attends Religious Services:   . Active Member of Clubs or Organizations:   . Attends Banker Meetings:   Marland Kitchen Marital Status:   Intimate Partner Violence:   . Fear of Current or Ex-Partner:   . Emotionally Abused:   Marland Kitchen Physically Abused:   . Sexually Abused:  Outpatient Medications Prior to Visit  Medication Sig Dispense Refill  . Dulaglutide 0.75 MG/0.5ML SOPN Inject into the skin.    Marland Kitchen glucose blood (PRECISION QID TEST) test strip by Other route Four (4) times a day. Check BS 4 times a day as directed.    . insulin NPH Human (NOVOLIN N) 100 UNIT/ML injection Inject 12-26 Units into the skin 2 (two) times daily before a meal. 26 units qam 12 units qpm    . insulin regular (NOVOLIN R) 100 units/mL injection Inject 6-10 Units into the skin 2 (two) times daily before a meal. 10 units qam 6 unit qpm    . lithium 300 MG tablet Take 600 mg by mouth daily.    . metFORMIN (GLUCOPHAGE) 1000 MG tablet Take 1,000 mg by mouth 2 (two) times daily with a meal.    . cefdinir (OMNICEF) 300 MG capsule Take 1 capsule (300 mg total) by mouth 2 (two) times daily. 4 capsule  0  . guaiFENesin-dextromethorphan (ROBITUSSIN DM) 100-10 MG/5ML syrup Take 10 mLs by mouth every 4 (four) hours as needed for cough. 118 mL 0  . insulin aspart (NOVOLOG) 100 UNIT/ML injection Inject 10 Units into the skin 3 (three) times daily with meals.     . metoCLOPramide (REGLAN) 10 MG tablet Take 1 tablet (10 mg total) by mouth 4 (four) times daily -  before meals and at bedtime. 120 tablet 1  . NORLYDA 0.35 MG tablet Take 1 tablet by mouth daily.    . sertraline (ZOLOFT) 50 MG tablet Take 50 mg by mouth daily.     No facility-administered medications prior to visit.     ROS:  Review of Systems  Constitutional: Positive for fatigue. Negative for fever and unexpected weight change.  Respiratory: Negative for cough, shortness of breath and wheezing.   Cardiovascular: Negative for chest pain, palpitations and leg swelling.  Gastrointestinal: Negative for blood in stool, constipation, diarrhea, nausea and vomiting.  Endocrine: Negative for cold intolerance, heat intolerance and polyuria.  Genitourinary: Positive for menstrual problem and vaginal discharge. Negative for dyspareunia, dysuria, flank pain, frequency, genital sores, hematuria, pelvic pain, urgency, vaginal bleeding and vaginal pain.  Musculoskeletal: Negative for back pain, joint swelling and myalgias.  Skin: Negative for rash.  Neurological: Negative for dizziness, syncope, light-headedness, numbness and headaches.  Hematological: Negative for adenopathy.  Psychiatric/Behavioral: Positive for agitation. Negative for confusion, sleep disturbance and suicidal ideas. The patient is not nervous/anxious.    BREAST: No symptoms   Objective: BP 110/84   Ht 5' (1.524 m)   Wt 161 lb (73 kg)   Breastfeeding No   BMI 31.44 kg/m    Physical Exam Constitutional:      Appearance: She is well-developed.  Genitourinary:     Vulva, vagina, cervix, uterus, right adnexa and left adnexa normal.     No vulval lesion or tenderness  noted.     No vaginal discharge, erythema or tenderness.     No cervical polyp.     Uterus is not enlarged or tender.     No right or left adnexal mass present.     Right adnexa not tender.     Left adnexa not tender.     Genitourinary Comments: BILAT LABIA MAJORA AND MINORA, PERINEAL AREA WITH ERYTHEMA, SWELLING, SKIN BREAKDOWN; FISSURES PERINEAL AREA  Neck:     Thyroid: No thyromegaly.  Cardiovascular:     Rate and Rhythm: Normal rate and regular rhythm.     Heart sounds: Normal  heart sounds. No murmur.  Pulmonary:     Effort: Pulmonary effort is normal.     Breath sounds: Normal breath sounds.  Chest:     Breasts:        Right: No mass, nipple discharge, skin change or tenderness.        Left: No mass, nipple discharge, skin change or tenderness.  Abdominal:     Palpations: Abdomen is soft.     Tenderness: There is no abdominal tenderness. There is no guarding.  Musculoskeletal:        General: Normal range of motion.     Cervical back: Normal range of motion.  Neurological:     General: No focal deficit present.     Mental Status: She is alert and oriented to person, place, and time.     Cranial Nerves: No cranial nerve deficit.  Skin:    General: Skin is warm and dry.  Psychiatric:        Mood and Affect: Mood normal.        Behavior: Behavior normal.        Thought Content: Thought content normal.        Judgment: Judgment normal.  Vitals reviewed.     Results: Results for orders placed or performed in visit on 09/19/19 (from the past 24 hour(s))  POCT urine pregnancy     Status: Abnormal   Collection Time: 09/19/19 10:35 AM  Result Value Ref Range   Preg Test, Ur Positive (A) Negative  POCT Wet Prep with KOH     Status: Normal   Collection Time: 09/19/19 11:08 AM  Result Value Ref Range   Trichomonas, UA Negative    Clue Cells Wet Prep HPF POC neg    Epithelial Wet Prep HPF POC     Yeast Wet Prep HPF POC neg    Bacteria Wet Prep HPF POC     RBC Wet Prep  HPF POC     WBC Wet Prep HPF POC     KOH Prep POC Negative Negative    Assessment/Plan: Encounter for annual routine gynecological examination  Cervical cancer screening - Plan: Cytology - PAP, CANCELED: Cytology - PAP  Screening for STD (sexually transmitted disease) - Plan: Cytology - PAP; STD check since pos preg test  Subacute vaginitis - Plan: clotrimazole-betamethasone (LOTRISONE) cream, POCT Wet Prep with KOH; Neg wet prep, pos sx. Question tinea cruris. Rx lotrisone crm. DM not well controlled. F/u prn.   Amenorrhea - Plan: POCT urine pregnancy; pos UPT.   Positive pregnancy test - Plan: US OB Comp Less 14 Wks; RTO for dating scan and NOB. Hx of habitual aborter. Start PNVs.   Meds ordered this encounter  Medications  . clotrimazole-betamethasone (LOTRISONE) cream    Sig: Apply externally BID for 1 wk    Dispense:  15 g    Refill:  0    Order Specific Question:   Supervising Provider    Answer:   Nadara Mustard [[id number]]             GYN counsel  adequate intake of calcium and vitamin D, diet and exercise     F/U  Return in about 3 days (around 09/22/2019) for dating u/s with NOB after with MD.   Ilona Sorrel. Elvy Mclarty, PA-C 09/19/2019 11:08 AM

## 2019-09-19 NOTE — Patient Instructions (Signed)
I value your feedback and entrusting us with your care. If you get a [facility] patient survey, I would appreciate you taking the time to let us know about your experience today. Thank you!  As of April 07, 2019, your lab results will be released to your MyChart immediately, before I even have a chance to see them. Please give me time to review them and contact you if there are any abnormalities. Thank you for your patience.  

## 2019-09-21 LAB — CYTOLOGY - PAP
Chlamydia: NEGATIVE
Comment: NEGATIVE
Comment: NEGATIVE
Comment: NORMAL
Diagnosis: UNDETERMINED — AB
High risk HPV: POSITIVE — AB
Neisseria Gonorrhea: NEGATIVE

## 2019-09-28 ENCOUNTER — Encounter: Payer: Self-pay | Admitting: Obstetrics & Gynecology

## 2019-09-28 ENCOUNTER — Ambulatory Visit (INDEPENDENT_AMBULATORY_CARE_PROVIDER_SITE_OTHER): Payer: 59

## 2019-09-28 ENCOUNTER — Other Ambulatory Visit: Payer: Self-pay

## 2019-09-28 ENCOUNTER — Ambulatory Visit (INDEPENDENT_AMBULATORY_CARE_PROVIDER_SITE_OTHER): Payer: 59 | Admitting: Obstetrics & Gynecology

## 2019-09-28 ENCOUNTER — Other Ambulatory Visit: Payer: Self-pay | Admitting: Obstetrics and Gynecology

## 2019-09-28 VITALS — BP 120/80 | Wt 161.0 lb

## 2019-09-28 DIAGNOSIS — Z3A01 Less than 8 weeks gestation of pregnancy: Secondary | ICD-10-CM

## 2019-09-28 DIAGNOSIS — Z369 Encounter for antenatal screening, unspecified: Secondary | ICD-10-CM

## 2019-09-28 DIAGNOSIS — Z3201 Encounter for pregnancy test, result positive: Secondary | ICD-10-CM

## 2019-09-28 DIAGNOSIS — E1069 Type 1 diabetes mellitus with other specified complication: Secondary | ICD-10-CM | POA: Insufficient documentation

## 2019-09-28 DIAGNOSIS — O24011 Pre-existing diabetes mellitus, type 1, in pregnancy, first trimester: Secondary | ICD-10-CM

## 2019-09-28 DIAGNOSIS — O0991 Supervision of high risk pregnancy, unspecified, first trimester: Secondary | ICD-10-CM

## 2019-09-28 DIAGNOSIS — Z98891 History of uterine scar from previous surgery: Secondary | ICD-10-CM | POA: Insufficient documentation

## 2019-09-28 DIAGNOSIS — E109 Type 1 diabetes mellitus without complications: Secondary | ICD-10-CM

## 2019-09-28 DIAGNOSIS — E669 Obesity, unspecified: Secondary | ICD-10-CM | POA: Insufficient documentation

## 2019-09-28 MED ORDER — DOXYLAMINE-PYRIDOXINE 10-10 MG PO TBEC: 2.0000 | DELAYED_RELEASE_TABLET | Freq: Every day | ORAL | 5 refills | Status: DC

## 2019-09-28 NOTE — Progress Notes (Signed)
09/28/2019   Chief Complaint: Missed period  Transfer of Care Patient: no Prior preg/deliveries at [facility]  History of Present Illness: Madison Harrell is a 29 y.o. [id number] [redacted]w[redacted]d based on Patient's last menstrual period was 08/14/2019. with an Estimated Date of Delivery: 05/20/20, with the above CC.   Her periods were: irreg and rare since her delivery 11/2018 She was using no method when she conceived.  She has Positive signs or symptoms of nausea/vomiting of pregnancy. She has Negative signs or symptoms of miscarriage or preterm labor She identifies Negative Zika risk factors for her and her partner On any different medications around the time she conceived/early pregnancy: Yes  History of varicella: Yes   ROS: A 12-point review of systems was performed and negative, except as stated in the above HPI.  OBGYN History: As per HPI. OB History  Gravida Para Term Preterm AB Living  8 2 1 1 5 2   SAB TAB Ectopic Multiple Live Births  5 0 0 0 2    # Outcome Date GA Lbr Len/2nd Weight Sex Delivery Anes PTL Lv  8 Current           7 Term 11/30/18 [redacted]w[redacted]d    VBAC   LIV  6 SAB 09/01/16     SAB     5 Preterm 08/01/11 [redacted]w[redacted]d   M CS-LTranv   LIV     Complications: Preeclampsia, severe  4 SAB           3 SAB           2 SAB           1 SAB             Any issues with any prior pregnancies: yes    Pt has IDDM since age 33, in porr control at times includimg this year; recently changed to a new Insulin regimen.    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 Last pap smear 08/2019. Abnormal: yes ASCUS, HPV POS History of STIs: No   Past Medical History: Past Medical History:  Diagnosis Date  . Diabetes mellitus without complication (HCC)     . pre E     Past Surgical History: Past Surgical History:  Procedure Laterality Date  . CESAREAN SECTION  2013    Family History:  Family History  Problem Relation Age of Onset  . Diabetes Father    She denies any female cancers, bleeding or blood clotting disorders.  She denies any history of mental retardation, birth defects or genetic disorders in her or the FOB's history  Social History:  Social History   Socioeconomic History  . Marital status: Married    Spouse name: Not on file  . Number of children: 2  . Years of education: Not on file  . Highest education level: Not on file  Occupational History  . Not on file  Tobacco Use  . Smoking status: Former Smoker    Types: Cigarettes  . Smokeless tobacco: Never Used  Substance and Sexual Activity  . Alcohol use: No  . Drug use: No  . Sexual activity: Yes    Birth control/protection: None  Other Topics Concern  . Not on file  Social  History Narrative  . Not on file   Social Determinants of Health   Financial Resource Strain:   . Difficulty of Paying Living Expenses:   Food Insecurity:   . Worried About Charity fundraiser in the Last Year:   . Arboriculturist in the Last Year:   Transportation Needs:   . Film/video editor (Medical):   Marland Kitchen Lack of Transportation (Non-Medical):   Physical Activity:   . Days of Exercise per Week:   . Minutes of Exercise per Session:   Stress:   . Feeling of Stress :   Social Connections:   . Frequency of Communication with Friends and Family:   . Frequency of Social Gatherings with Friends and Family:   . Attends Religious Services:   . Active Member of Clubs or Organizations:   . Attends Archivist Meetings:   Marland Kitchen Marital Status:   Intimate Partner Violence:   . Fear of Current or Ex-Partner:   . Emotionally Abused:   Marland Kitchen Physically Abused:   . Sexually Abused:    Any pets in the household: no  Allergy: No Known Allergies  Current Outpatient  Medications:  Current Outpatient Medications:  .  clotrimazole-betamethasone (LOTRISONE) cream, Apply externally BID for 1 wk, Disp: 15 g, Rfl: 0 .  Dulaglutide 0.75 MG/0.5ML SOPN, Inject into the skin., Disp: , Rfl:  .  glucose blood (PRECISION QID TEST) test strip, by Other route Four (4) times a day. Check BS 4 times a day as directed., Disp: , Rfl:  .  insulin NPH Human (NOVOLIN N) 100 UNIT/ML injection, Inject 12-26 Units into the skin 2 (two) times daily before a meal. 26 units qam 12 units qpm, Disp: , Rfl:  .  insulin regular (NOVOLIN R) 100 units/mL injection, Inject 6-10 Units into the skin 2 (two) times daily before a meal. 10 units qam 6 unit qpm, Disp: , Rfl:  .  lithium 300 MG tablet, Take 600 mg by mouth daily., Disp: , Rfl:  .  metFORMIN (GLUCOPHAGE) 1000 MG tablet, Take 1,000 mg by mouth 2 (two) times daily with a meal., Disp: , Rfl:  .  Doxylamine-Pyridoxine (DICLEGIS) 10-10 MG TBEC, Take 2 tablets by mouth at bedtime. If symptoms persist, add one tablet in the morning and one in the afternoon, Disp: 100 tablet, Rfl: 5   Physical Exam:   BP 120/80   Wt 161 lb (73 kg)   LMP 08/14/2019   BMI 31.44 kg/m  Body mass index is 31.44 kg/m. Constitutional: Well nourished, well developed female in no acute distress.  Neck:  Supple, normal appearance, and no thyromegaly  Cardiovascular: S1, S2 normal, no murmur, rub or gallop, regular rate and rhythm Respiratory:  Clear to auscultation bilateral. Normal respiratory effort Abdomen: positive bowel sounds and no masses, hernias; diffusely non tender to palpation, non distended Neuro/Psych:  Normal mood and affect.  Skin:  Warm and dry.  Lymphatic:  No inguinal lymphadenopathy.   Review of ULTRASOUND.    I have personally reviewed images and report of recent ultrasound done at [facility].    Plan of management to be discussed with patient. + FHTs, CRL x1 [facility] 05/20/20 by [place]  Assessment: Madison Harrell is a 29 y.o. [id number] [redacted]w[redacted]d based  on Patient's last menstrual period was 08/14/2019. with an Estimated Date of Delivery: 05/20/20,  for prenatal care.  Plan:  1) Avoid alcoholic beverages. 2) Patient encouraged not to smoke.  3) Discontinue the use of all non-medicinal  drugs and chemicals.  4) Take prenatal vitamins daily.  5) Seatbelt use advised 6) Nutrition, food safety (fish, cheese advisories, and high nitrite foods) and exercise discussed. 7) Hospital and practice style delivering at [facility] discussed  8) Patient is asked about travel to areas at risk for the Zika virus, and counseled to avoid travel and exposure to mosquitoes or sexual partners who may have themselves been exposed to the virus. Testing is discussed, and will be ordered as appropriate.  9) Childbirth classes at [facility] advised 10) Genetic Screening, such as with 1st Trimester Screening, cell free fetal DNA, AFP testing, and Ultrasound, as well as with amniocentesis and CVS as appropriate, is discussed with patient. She plans to have genetic testing this pregnancy.  11) IDDM, cont Insulin and Metformin and will base regimen on MFM consultation and endocrinology Referral to be set up HgbA1C now 12) Diclegis for nausea 13) Baby ASA after 12 weeks for preeclampsia prophylaxis 14) VBAC desired again 15) Monitor for s/sx early pregnancy loss as she has recurrence with this in past 16) BMI 31, counsel as to risks of obesity and pregnancy 17) ASCUS HPV POS, has COlpo scheduled for 09/30/19  Problem list reviewed and updated.  Annamarie Major, MD, [facility], [facility] 09/28/2019  3:30 PM

## 2019-09-28 NOTE — Patient Instructions (Signed)
Doxylamine; Pyridoxine Delayed- and Extended-Release Oral Tablets What is this medicine? Doxylamine; pyridoxine (dox IL a meen; peer i DOX een) is a combination of an antihistamine and vitamin B6. The drug is used to treat nausea and vomiting associated with pregnancy. This medicine may be used for other purposes; ask your health care provider or pharmacist if you have questions. COMMON BRAND NAME(S): BONJESTA, Diclegis What should I tell my health care provider before I take this medicine? They need to know if you have any of these conditions:  contact lenses  glaucoma  liver disease  lung or breathing disease, like asthma or emphysema  pain or trouble passing urine  prostate trouble  ulcers or other stomach problems  an unusual or allergic reaction to doxylamine or pyridoxine (vitamin B6), other medicines, foods, dyes, or preservatives  breast-feeding How should I use this medicine? Take this medicine by mouth with a glass of water. Do not cut, crush or chew this medicine. Follow the directions on the package or prescription label. Take this medicine on an empty stomach. Take your medicine at regular intervals. Do not take it more often than directed. Talk to your pediatrician regarding the use of this medicine in children. Special care may be needed. Overdosage: If you think you have taken too much of this medicine contact a poison control center or emergency room at once. NOTE: This medicine is only for you. Do not share this medicine with others. What if I miss a dose? If you miss a dose, take it as soon as you can. If it is almost time for your next dose, take only that dose. Do not take double or extra doses. What may interact with this medicine?  alcohol  atropine  antihistamines for allergy, cough and cold  certain medicines for bladder problems like oxybutynin, tolterodine  certain medicines for stomach problems like dicyclomine, hyoscyamine  certain medicines  for travel sickness like scopolamine  certain medicines for Parkinson's disease like benztropine, trihexyphenidyl  ipratropium  MAOIs like Carbex, Eldepryl, Marplan, Nardil, and Parnate This list may not describe all possible interactions. Give your health care provider a list of all the medicines, herbs, non-prescription drugs, or dietary supplements you use. Also tell them if you smoke, drink alcohol, or use illegal drugs. Some items may interact with your medicine. What should I watch for while using this medicine? Tell your doctor or healthcare professional if your symptoms do not start to get better or if they get worse. See your doctor right away if you get a high fever or have problems breathing. Your mouth may get dry. Chewing sugarless gum or sucking hard candy, and drinking plenty of water may help. Contact your doctor if the problem does not go away or is severe. This medicine may cause dry eyes and blurred vision. If you wear contact lenses you may feel some discomfort. Lubricating drops may help. See your eye doctor if the problem does not go away or is severe. This medicine may cause false positive urine tests for certain medicines like methadone, opiates, and PCP. You may get drowsy or dizzy. Do not drive, use machinery, or do anything that needs mental alertness until you know how this medicine affects you. Do not stand or sit up quickly, especially if you are an older patient. This reduces the risk of dizzy or fainting spells. What side effects may I notice from receiving this medicine? Side effects that you should report to your doctor or health care professional as soon  as possible:  allergic reactions like skin rash, itching or hives, swelling of the face, lips, or tongue  changes in vision  confused, agitated, or nervous  fast, irregular heartbeat  feeling faint or lightheaded, falls  muscle or facial twitches  seizure  trouble passing urine or change in the amount  of urine Side effects that usually do not require medical attention (report to your doctor or health care professional if they continue or are bothersome):  dry mouth  headache  loss of appetite  stomach upset This list may not describe all possible side effects. Call your doctor for medical advice about side effects. You may report side effects to FDA at 1-800-FDA-1088. Where should I keep my medicine? Keep out of the reach of children. Store at room temperature between 15 and 30 degrees C (59 and 86 degrees F). Keep bottle tightly closed and protect from moisture. Do not remove desiccant canister from bottle. Throw away any unused medicine after the expiration date. NOTE: This sheet is a summary. It may not cover all possible information. If you have questions about this medicine, talk to your doctor, pharmacist, or health care provider.  2020 Elsevier/Gold Standard (2019-02-01 11:34:28)

## 2019-09-29 LAB — RPR+RH+ABO+RUB AB+AB SCR+CB...
Antibody Screen: NEGATIVE
HIV Screen 4th Generation wRfx: NONREACTIVE
Hematocrit: 43.7 % (ref 34.0–46.6)
Hemoglobin: 15.3 g/dL (ref 11.1–15.9)
Hepatitis B Surface Ag: NEGATIVE
MCH: 28.7 pg (ref 26.6–33.0)
MCHC: 35 g/dL (ref 31.5–35.7)
MCV: 82 fL (ref 79–97)
Platelets: 385 10*3/uL (ref 150–450)
RBC: 5.34 x10E6/uL — ABNORMAL HIGH (ref 3.77–5.28)
RDW: 15.7 % — ABNORMAL HIGH (ref 11.7–15.4)
RPR Ser Ql: NONREACTIVE
Rh Factor: POSITIVE
Rubella Antibodies, IGG: 1.95 index (ref >0.99)
Varicella zoster IgG: 755 index (ref >165)
WBC: 5.6 10*3/uL (ref 3.4–10.8)

## 2019-09-29 LAB — GLUCOSE, RANDOM: Glucose: 222 mg/dL — ABNORMAL HIGH (ref 65–99)

## 2019-09-29 LAB — HEMOGLOBIN A1C
Est. average glucose Bld gHb Est-mCnc: 246 mg/dL
Hgb A1c MFr Bld: 10.2 % — ABNORMAL HIGH (ref 4.8–5.6)

## 2019-09-30 ENCOUNTER — Other Ambulatory Visit: Payer: Self-pay | Admitting: Obstetrics & Gynecology

## 2019-09-30 ENCOUNTER — Other Ambulatory Visit: Payer: Self-pay

## 2019-09-30 ENCOUNTER — Encounter: Payer: Self-pay | Admitting: Obstetrics & Gynecology

## 2019-09-30 ENCOUNTER — Ambulatory Visit (INDEPENDENT_AMBULATORY_CARE_PROVIDER_SITE_OTHER): Payer: 59 | Admitting: Obstetrics & Gynecology

## 2019-09-30 ENCOUNTER — Other Ambulatory Visit (HOSPITAL_COMMUNITY)
Admission: RE | Admit: 2019-09-30 | Discharge: 2019-09-30 | Disposition: A | Discharge status: Home / Self Care | Payer: BC Managed Care – PPO | Source: Ambulatory Visit | Attending: Obstetrics & Gynecology | Admitting: Obstetrics & Gynecology

## 2019-09-30 VITALS — BP 120/80 | Ht 60.0 in | Wt 162.0 lb

## 2019-09-30 DIAGNOSIS — R87610 Atypical squamous cells of undetermined significance on cytologic smear of cervix (ASC-US): Secondary | ICD-10-CM

## 2019-09-30 DIAGNOSIS — R87810 Cervical high risk human papillomavirus (HPV) DNA test positive: Secondary | ICD-10-CM | POA: Diagnosis present

## 2019-09-30 LAB — URINE CULTURE

## 2019-09-30 MED ORDER — CEPHALEXIN 500 MG PO CAPS: 500.0000 mg | ORAL_CAPSULE | Freq: Four times a day (QID) | ORAL | 2 refills | Status: DC

## 2019-09-30 NOTE — Patient Instructions (Signed)

## 2019-09-30 NOTE — Progress Notes (Signed)
Referring Provider:  none  HPI:  Madison Harrell is a 29 y.o.  [phone]  who presents today for evaluation and management of abnormal cervical cytology.    Dysplasia History:  ASCUS, POS HPV Pregnant  ROS:  Pertinent items are noted in HPI.  OB History  Gravida Para Term Preterm AB Living  8 2 1 1 5 2   SAB TAB Ectopic Multiple Live Births  5 0 0 0 2    # Outcome Date GA Lbr Len/2nd Weight Sex Delivery Anes PTL Lv  8 Current           7 Term 11/30/18 [redacted]w[redacted]d    VBAC   LIV  6 SAB 09/01/16     SAB     5 Preterm 08/01/11 [redacted]w[redacted]d   M CS-LTranv   LIV     Complications: Preeclampsia, severe  4 SAB           3 SAB           2 SAB           1 SAB             Past Medical History:  Diagnosis Date   Diabetes mellitus without complication ([place])    pre E     Past Surgical History:  Procedure Laterality Date   CESAREAN SECTION  2013    SOCIAL HISTORY: Social History   Substance and Sexual Activity  Alcohol Use No   Social History   Substance and Sexual Activity  Drug Use No     Family History  Problem Relation Age of Onset   Diabetes Father     ALLERGIES:  Patient has no known allergies.  Current Outpatient Medications on File Prior to Visit  Medication Sig Dispense Refill   clotrimazole-betamethasone (LOTRISONE) cream Apply externally BID for 1 wk 15 g 0   Doxylamine-Pyridoxine (DICLEGIS) 10-10 MG TBEC Take 2 tablets by mouth at bedtime. If symptoms persist, add one tablet in the morning and one in the afternoon 100 tablet 5   glucose blood (PRECISION QID TEST) test strip by Other route Four (4) times a day. Check BS 4 times a day as directed.     insulin NPH Human (NOVOLIN N) 100 UNIT/ML injection Inject 12-26 Units into the skin 2 (two) times daily before a meal. 26 units qam 12 units qpm     insulin regular (NOVOLIN R) 100 units/mL injection Inject 6-10 Units into the skin 2 (two) times daily before a meal. 10 units qam 6 unit qpm     lithium 300 MG  tablet Take 600 mg by mouth daily.     metFORMIN (GLUCOPHAGE) 1000 MG tablet Take 1,000 mg by mouth 2 (two) times daily with a meal.     No current facility-administered medications on file prior to visit.    Physical Exam: -Vitals:  BP 120/80    Ht 5' (1.524 m)    Wt 162 lb (73.5 kg)    LMP 08/14/2019    BMI 31.64 kg/m  GEN: WD, WN, NAD.  A+ O x 3, good mood and affect. ABD:  NT, ND.  Soft, no masses.  No hernias noted.   Pelvic:   Vulva: Normal appearance.  No lesions.  Vagina: No lesions or abnormalities noted.  Support: Normal pelvic support.  Urethra No masses tenderness or scarring.  Meatus Normal size without lesions or prolapse.  Cervix: See below.  Anus: Normal exam.  No lesions.  Perineum: Normal exam.  No  lesions.   PROCEDURE: 1.  Urine Pregnancy Test:  positive 2.  Colposcopy performed with 4% acetic acid after verbal consent obtained                                         -Aceto-white Lesions Location(s): 6 o'clock.              -Biopsy performed at 6 o'clock               -ECC indicated and performed: No.(PREGNANCY)     -Biopsy sites made hemostatic with pressure, AgNO3, and/or Monsel's solution   -Satisfactory colposcopy: Yes.      -Evidence of Invasive cervical CA :  NO  ASSESSMENT:  Madison Harrell is a 29 y.o. [id number] here for  1. ASCUS with positive high risk HPV cervical    PLAN: 1.  I discussed the grading system of pap smears and HPV high risk viral types.  We will discuss and base management after colpo results return. 2. Follow up PAP 6 months/ POSTPARTUM, vs intervention if high grade dysplasia identified  Counseled as to lab results and concerns for IDDM and pregnancy.  Risks for fetal demise, birth defects, other pregnancy and maternal complications.  In process of involving MFM.  Appt 10/05/19 w [facility] MFM group.    Annamarie Major, MD, [facility], [facility] 09/30/2019  9:48 AM

## 2019-10-04 LAB — SURGICAL PATHOLOGY

## 2019-10-05 ENCOUNTER — Ambulatory Visit: Payer: BC Managed Care – PPO | Attending: Obstetrics & Gynecology

## 2019-10-05 ENCOUNTER — Ambulatory Visit: Payer: BC Managed Care – PPO

## 2019-10-13 ENCOUNTER — Encounter: Payer: 59 | Admitting: Obstetrics and Gynecology

## 2019-10-17 ENCOUNTER — Telehealth: Payer: Self-pay

## 2019-10-17 NOTE — Telephone Encounter (Signed)
Pt missed [facility] appointment on 6/9. LVM w/[facility] phone number for pt to call and reschedule that appointment. Advised the importance of the appointment for Type 1 diabetes.

## 2019-10-19 ENCOUNTER — Encounter: Payer: 59 | Admitting: Advanced Practice Midwife

## 2019-10-27 ENCOUNTER — Telehealth: Payer: Self-pay

## 2019-10-27 NOTE — Telephone Encounter (Signed)
Pt calling; her work wants her to start FMLA b/c she can't do her job duties for morning sickness and they don't want her to quit; work needs dr's note taking pt out of work until the baby is born.  [phone]  Pt adv to d/w provider at appt tomorrow.

## 2019-10-28 ENCOUNTER — Ambulatory Visit (INDEPENDENT_AMBULATORY_CARE_PROVIDER_SITE_OTHER): Payer: 59 | Admitting: Obstetrics

## 2019-10-28 ENCOUNTER — Encounter: Payer: Self-pay | Admitting: Emergency Medicine

## 2019-10-28 ENCOUNTER — Other Ambulatory Visit: Payer: Self-pay

## 2019-10-28 ENCOUNTER — Emergency Department
Admission: EM | Admit: 2019-10-28 | Discharge: 2019-10-28 | Disposition: A | Discharge status: Home / Self Care | Payer: BC Managed Care – PPO | Source: Home / Self Care | Attending: Emergency Medicine | Admitting: Emergency Medicine

## 2019-10-28 VITALS — BP 110/80 | Wt 158.0 lb

## 2019-10-28 DIAGNOSIS — E119 Type 2 diabetes mellitus without complications: Secondary | ICD-10-CM | POA: Insufficient documentation

## 2019-10-28 DIAGNOSIS — O219 Vomiting of pregnancy, unspecified: Secondary | ICD-10-CM

## 2019-10-28 DIAGNOSIS — Z98891 History of uterine scar from previous surgery: Secondary | ICD-10-CM

## 2019-10-28 DIAGNOSIS — O211 Hyperemesis gravidarum with metabolic disturbance: Secondary | ICD-10-CM | POA: Diagnosis not present

## 2019-10-28 DIAGNOSIS — O2341 Unspecified infection of urinary tract in pregnancy, first trimester: Secondary | ICD-10-CM

## 2019-10-28 DIAGNOSIS — O0991 Supervision of high risk pregnancy, unspecified, first trimester: Secondary | ICD-10-CM | POA: Diagnosis not present

## 2019-10-28 DIAGNOSIS — O2391 Unspecified genitourinary tract infection in pregnancy, first trimester: Secondary | ICD-10-CM | POA: Insufficient documentation

## 2019-10-28 DIAGNOSIS — O24011 Pre-existing diabetes mellitus, type 1, in pregnancy, first trimester: Secondary | ICD-10-CM | POA: Diagnosis not present

## 2019-10-28 DIAGNOSIS — O210 Mild hyperemesis gravidarum: Secondary | ICD-10-CM | POA: Diagnosis not present

## 2019-10-28 DIAGNOSIS — Z3A10 10 weeks gestation of pregnancy: Secondary | ICD-10-CM

## 2019-10-28 DIAGNOSIS — Z794 Long term (current) use of insulin: Secondary | ICD-10-CM | POA: Insufficient documentation

## 2019-10-28 DIAGNOSIS — Z87891 Personal history of nicotine dependence: Secondary | ICD-10-CM | POA: Insufficient documentation

## 2019-10-28 LAB — POCT URINALYSIS DIPSTICK
Bilirubin, UA: NEGATIVE
Blood, UA: NEGATIVE
Glucose, UA: POSITIVE — AB
Nitrite, UA: NEGATIVE
Protein, UA: POSITIVE — AB
Spec Grav, UA: 1.025 (ref 1.010–1.025)
Urobilinogen, UA: NEGATIVE E.U./dL — AB
pH, UA: 5 (ref 5.0–8.0)

## 2019-10-28 LAB — COMPREHENSIVE METABOLIC PANEL
ALT: 41 U/L (ref 0–44)
AST: 22 U/L (ref 15–41)
Albumin: 4 g/dL (ref 3.5–5.0)
Alkaline Phosphatase: 60 U/L (ref 38–126)
Anion gap: 9 (ref 5–15)
BUN: 11 mg/dL (ref 6–20)
CO2: 25 mmol/L (ref 22–32)
Calcium: 9.1 mg/dL (ref 8.9–10.3)
Chloride: 102 mmol/L (ref 98–111)
Creatinine, Ser: 0.49 mg/dL (ref 0.44–1.00)
GFR calc Af Amer: >60 mL/min (ref >60)
GFR calc non Af Amer: >60 mL/min (ref >60)
Glucose, Bld: 142 mg/dL — ABNORMAL HIGH (ref 70–99)
Potassium: 3.9 mmol/L (ref 3.5–5.1)
Sodium: 136 mmol/L (ref 135–145)
Total Bilirubin: 1.3 mg/dL — ABNORMAL HIGH (ref 0.3–1.2)
Total Protein: 7.8 g/dL (ref 6.5–8.1)

## 2019-10-28 LAB — URINALYSIS, COMPLETE (UACMP) WITH MICROSCOPIC
Bilirubin Urine: NEGATIVE
Glucose, UA: 150 mg/dL — AB
Hgb urine dipstick: NEGATIVE
Ketones, ur: 5 mg/dL — AB
Nitrite: POSITIVE — AB
Protein, ur: 30 mg/dL — AB
Specific Gravity, Urine: 1.02 (ref 1.005–1.030)
pH: 5 (ref 5.0–8.0)

## 2019-10-28 LAB — CBC
HCT: 39.4 % (ref 36.0–46.0)
Hemoglobin: 14.1 g/dL (ref 12.0–15.0)
MCH: 30.1 pg (ref 26.0–34.0)
MCHC: 35.8 g/dL (ref 30.0–36.0)
MCV: 84 fL (ref 80.0–100.0)
Platelets: 366 10*3/uL (ref 150–400)
RBC: 4.69 MIL/uL (ref 3.87–5.11)
RDW: 14.1 % (ref 11.5–15.5)
WBC: 7.8 10*3/uL (ref 4.0–10.5)
nRBC: 0 % (ref 0.0–0.2)

## 2019-10-28 LAB — MAGNESIUM: Magnesium: 2.1 mg/dL (ref 1.7–2.4)

## 2019-10-28 LAB — GLUCOSE, POCT (MANUAL RESULT ENTRY): POC Glucose: 143 mg/dl — AB (ref 70–99)

## 2019-10-28 LAB — LIPASE, BLOOD: Lipase: 31 U/L (ref 11–51)

## 2019-10-28 MED ORDER — SODIUM CHLORIDE 0.9% FLUSH: 3.0000 mL | Freq: Once | INTRAVENOUS | Status: DC

## 2019-10-28 MED ORDER — ONDANSETRON HCL 4 MG/2ML IJ SOLN
4.0000 mg | Freq: Once | INTRAMUSCULAR | Status: AC
  Administered 2019-10-28: 4 mg via INTRAVENOUS
  Filled 2019-10-28: qty 2

## 2019-10-28 MED ORDER — ONDANSETRON 4 MG PO TBDP
4.0000 mg | ORAL_TABLET | Freq: Three times a day (TID) | ORAL | 0 refills | Status: DC | PRN
Start: 2019-10-28 — End: 2020-10-22

## 2019-10-28 MED ORDER — CEPHALEXIN 250 MG PO CAPS: 250.0000 mg | ORAL_CAPSULE | Freq: Four times a day (QID) | ORAL | 0 refills | Status: DC

## 2019-10-28 MED ORDER — SODIUM CHLORIDE 0.9 % IV BOLUS
1000.0000 mL | Freq: Once | INTRAVENOUS | Status: AC
  Administered 2019-10-28: 1000 mL via INTRAVENOUS

## 2019-10-28 NOTE — ED Triage Notes (Signed)
States she is [redacted] weeks pregnant.  C/O vomiting x 1 month.  C/O concerns for dehydration.  EDC: 05/20/2020  G3 P2
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
## 2019-10-28 NOTE — Discharge Instructions (Addendum)
Your labs did not show evidence of DKA or severe dehydration.  We are sending you home with some nausea medicine and some antibiotics for concern for UTI.  You should follow-up with your OB/GYN to make sure that the urine culture was sensitive to the antibiotic we prescribed, Keflex.  Return to the ER for worsening vomiting, elevated sugars or any other concerns

## 2019-10-28 NOTE — Progress Notes (Signed)
ROB- no concerns 

## 2019-10-28 NOTE — ED Notes (Signed)
Pt reports is a G3P2, states has had increased N/V with pregnancy, is currently approx [redacted] weeks along. Pt states sent by OB due to patient not taking insulin for diabetes and N/V. Pt reports approx 9 episodes in last 24 hrs. Pt states UA at OB showed Ketones in urine. Pt A&O x4, c/o nausea upon arrival to ED. VSS at this time. States was sent by [facility] for fluids and nausea medication.

## 2019-10-28 NOTE — ED Triage Notes (Signed)
[redacted] week pregnant has been vomiting.  Has ketones in urine and her doctor sent here here.

## 2019-10-30 LAB — URINE CULTURE: Culture: >=100000 — AB

## 2019-10-31 ENCOUNTER — Other Ambulatory Visit: Payer: Self-pay

## 2019-10-31 ENCOUNTER — Encounter: Payer: Self-pay | Admitting: Obstetrics and Gynecology

## 2019-10-31 ENCOUNTER — Inpatient Hospital Stay
Admission: EM | Admit: 2019-10-31 | Discharge: 2019-11-04 | DRG: 832 | Disposition: A | Discharge status: Home / Self Care | Payer: BC Managed Care – PPO | Attending: Obstetrics and Gynecology | Admitting: Obstetrics and Gynecology

## 2019-10-31 DIAGNOSIS — O2341 Unspecified infection of urinary tract in pregnancy, first trimester: Secondary | ICD-10-CM | POA: Diagnosis present

## 2019-10-31 DIAGNOSIS — Z20822 Contact with and (suspected) exposure to covid-19: Secondary | ICD-10-CM | POA: Diagnosis present

## 2019-10-31 DIAGNOSIS — O0991 Supervision of high risk pregnancy, unspecified, first trimester: Secondary | ICD-10-CM | POA: Diagnosis not present

## 2019-10-31 DIAGNOSIS — Z880 Allergy status to penicillin: Secondary | ICD-10-CM | POA: Diagnosis not present

## 2019-10-31 DIAGNOSIS — Z3A11 11 weeks gestation of pregnancy: Secondary | ICD-10-CM

## 2019-10-31 DIAGNOSIS — O210 Mild hyperemesis gravidarum: Secondary | ICD-10-CM | POA: Diagnosis present

## 2019-10-31 DIAGNOSIS — O211 Hyperemesis gravidarum with metabolic disturbance: Principal | ICD-10-CM | POA: Diagnosis present

## 2019-10-31 DIAGNOSIS — F329 Major depressive disorder, single episode, unspecified: Secondary | ICD-10-CM | POA: Diagnosis present

## 2019-10-31 DIAGNOSIS — O24011 Pre-existing diabetes mellitus, type 1, in pregnancy, first trimester: Secondary | ICD-10-CM | POA: Diagnosis present

## 2019-10-31 DIAGNOSIS — Z794 Long term (current) use of insulin: Secondary | ICD-10-CM | POA: Diagnosis not present

## 2019-10-31 DIAGNOSIS — R112 Nausea with vomiting, unspecified: Secondary | ICD-10-CM

## 2019-10-31 DIAGNOSIS — O99341 Other mental disorders complicating pregnancy, first trimester: Secondary | ICD-10-CM | POA: Diagnosis present

## 2019-10-31 DIAGNOSIS — Z87891 Personal history of nicotine dependence: Secondary | ICD-10-CM

## 2019-10-31 DIAGNOSIS — E1021 Type 1 diabetes mellitus with diabetic nephropathy: Secondary | ICD-10-CM | POA: Diagnosis present

## 2019-10-31 DIAGNOSIS — E1069 Type 1 diabetes mellitus with other specified complication: Secondary | ICD-10-CM | POA: Diagnosis not present

## 2019-10-31 LAB — COMPREHENSIVE METABOLIC PANEL
ALT: 46 U/L — ABNORMAL HIGH (ref 0–44)
AST: 34 U/L (ref 15–41)
Albumin: 4.3 g/dL (ref 3.5–5.0)
Alkaline Phosphatase: 68 U/L (ref 38–126)
Anion gap: 14 (ref 5–15)
BUN: 20 mg/dL (ref 6–20)
CO2: 22 mmol/L (ref 22–32)
Calcium: 9.1 mg/dL (ref 8.9–10.3)
Chloride: 101 mmol/L (ref 98–111)
Creatinine, Ser: 0.51 mg/dL (ref 0.44–1.00)
GFR calc Af Amer: >60 mL/min (ref >60)
GFR calc non Af Amer: >60 mL/min (ref >60)
Glucose, Bld: 131 mg/dL — ABNORMAL HIGH (ref 70–99)
Potassium: 3.3 mmol/L — ABNORMAL LOW (ref 3.5–5.1)
Sodium: 137 mmol/L (ref 135–145)
Total Bilirubin: 2 mg/dL — ABNORMAL HIGH (ref 0.3–1.2)
Total Protein: 8.4 g/dL — ABNORMAL HIGH (ref 6.5–8.1)

## 2019-10-31 LAB — CBC
HCT: 40.3 % (ref 36.0–46.0)
Hemoglobin: 14.4 g/dL (ref 12.0–15.0)
MCH: 30.2 pg (ref 26.0–34.0)
MCHC: 35.7 g/dL (ref 30.0–36.0)
MCV: 84.5 fL (ref 80.0–100.0)
Platelets: 365 10*3/uL (ref 150–400)
RBC: 4.77 MIL/uL (ref 3.87–5.11)
RDW: 13.8 % (ref 11.5–15.5)
WBC: 7.9 10*3/uL (ref 4.0–10.5)
nRBC: 0 % (ref 0.0–0.2)

## 2019-10-31 LAB — URINALYSIS, COMPLETE (UACMP) WITH MICROSCOPIC
Glucose, UA: NEGATIVE mg/dL
Hgb urine dipstick: NEGATIVE
Ketones, ur: 80 mg/dL — AB
Nitrite: NEGATIVE
Protein, ur: >=300 mg/dL — AB
Specific Gravity, Urine: 1.028 (ref 1.005–1.030)
WBC, UA: >50 WBC/hpf — ABNORMAL HIGH (ref 0–5)
pH: 6 (ref 5.0–8.0)

## 2019-10-31 LAB — GLUCOSE, CAPILLARY
Glucose-Capillary: 120 mg/dL — ABNORMAL HIGH (ref 70–99)
Glucose-Capillary: 95 mg/dL (ref 70–99)
Glucose-Capillary: 89 mg/dL (ref 70–99)
Glucose-Capillary: 79 mg/dL (ref 70–99)

## 2019-10-31 LAB — SARS CORONAVIRUS 2 BY RT PCR (HOSPITAL ORDER, PERFORMED IN ~~LOC~~ HOSPITAL LAB): SARS Coronavirus 2: NEGATIVE

## 2019-10-31 LAB — BETA-HYDROXYBUTYRIC ACID: Beta-Hydroxybutyric Acid: 0.76 mmol/L — ABNORMAL HIGH (ref 0.05–0.27)

## 2019-10-31 MED ORDER — THIAMINE HCL 100 MG/ML IJ SOLN
INTRAVENOUS | Status: AC
  Filled 2019-10-31 (×3): qty 1000

## 2019-10-31 MED ORDER — ONDANSETRON 4 MG PO TBDP: 4.0000 mg | ORAL_TABLET | Freq: Three times a day (TID) | ORAL | Status: DC | PRN

## 2019-10-31 MED ORDER — HYDROXYZINE HCL 50 MG/ML IM SOLN
50.0000 mg | Freq: Four times a day (QID) | INTRAMUSCULAR | Status: DC | PRN
  Filled 2019-10-31: qty 1

## 2019-10-31 MED ORDER — ONDANSETRON 4 MG PO TBDP
4.0000 mg | ORAL_TABLET | Freq: Four times a day (QID) | ORAL | Status: DC
  Filled 2019-10-31: qty 2

## 2019-10-31 MED ORDER — INSULIN ASPART 100 UNIT/ML ~~LOC~~ SOLN
0.0000 [IU] | SUBCUTANEOUS | Status: DC
  Administered 2019-11-01 (×2): 1 [IU] via SUBCUTANEOUS
  Administered 2019-11-01: 2 [IU] via SUBCUTANEOUS
  Administered 2019-11-02 (×3): 1 [IU] via SUBCUTANEOUS
  Administered 2019-11-02: 2 [IU] via SUBCUTANEOUS
  Filled 2019-10-31 (×7): qty 1

## 2019-10-31 MED ORDER — ONDANSETRON HCL 4 MG/2ML IJ SOLN
4.0000 mg | Freq: Once | INTRAMUSCULAR | Status: AC | PRN
  Administered 2019-10-31: 4 mg via INTRAVENOUS
  Filled 2019-10-31: qty 2

## 2019-10-31 MED ORDER — PROMETHAZINE HCL 25 MG RE SUPP
12.5000 mg | RECTAL | Status: DC | PRN
  Filled 2019-10-31: qty 1

## 2019-10-31 MED ORDER — DEXTROSE-NACL 5-0.9 % IV SOLN: INTRAVENOUS | Status: DC

## 2019-10-31 MED ORDER — SODIUM CHLORIDE 0.9 % IV SOLN
1.0000 g | INTRAVENOUS | Status: DC
  Administered 2019-10-31 – 2019-11-02 (×3): 1 g via INTRAVENOUS
  Filled 2019-10-31: qty 10
  Filled 2019-10-31: qty 1
  Filled 2019-10-31 (×2): qty 10

## 2019-10-31 MED ORDER — PROMETHAZINE HCL 25 MG RE SUPP
12.5000 mg | Freq: Four times a day (QID) | RECTAL | Status: DC
  Filled 2019-10-31 (×3): qty 1

## 2019-10-31 MED ORDER — FAMOTIDINE 20 MG PO TABS
20.0000 mg | ORAL_TABLET | Freq: Two times a day (BID) | ORAL | Status: DC
  Administered 2019-11-01 – 2019-11-04 (×6): 20 mg via ORAL
  Filled 2019-10-31 (×6): qty 1

## 2019-10-31 MED ORDER — PROMETHAZINE HCL 25 MG PO TABS
12.5000 mg | ORAL_TABLET | Freq: Four times a day (QID) | ORAL | Status: DC
  Filled 2019-10-31 (×2): qty 1

## 2019-10-31 MED ORDER — SODIUM CHLORIDE 0.9 % IV SOLN
INTRAVENOUS | Status: DC | PRN
  Administered 2019-10-31 – 2019-11-02 (×3): 250 mL via INTRAVENOUS

## 2019-10-31 MED ORDER — POTASSIUM CHLORIDE 10 MEQ/100ML IV SOLN
10.0000 meq | INTRAVENOUS | Status: AC
  Administered 2019-10-31 (×2): 10 meq via INTRAVENOUS
  Filled 2019-10-31 (×3): qty 100

## 2019-10-31 MED ORDER — THIAMINE HCL 100 MG/ML IJ SOLN
100.0000 mg | INTRAMUSCULAR | Status: DC
  Administered 2019-11-01 – 2019-11-02 (×2): 100 mg via INTRAVENOUS
  Filled 2019-10-31 (×2): qty 1

## 2019-10-31 MED ORDER — HYDROXYZINE HCL 25 MG PO TABS: 50.0000 mg | ORAL_TABLET | Freq: Four times a day (QID) | ORAL | Status: DC | PRN

## 2019-10-31 MED ORDER — PROMETHAZINE HCL 25 MG/ML IJ SOLN
12.5000 mg | Freq: Four times a day (QID) | INTRAMUSCULAR | Status: DC
  Administered 2019-10-31 – 2019-11-02 (×10): 25 mg via INTRAVENOUS
  Filled 2019-10-31 (×10): qty 1

## 2019-10-31 MED ORDER — SODIUM CHLORIDE 0.9 % IV SOLN
8.0000 mg | Freq: Three times a day (TID) | INTRAVENOUS | Status: DC | PRN
  Filled 2019-10-31: qty 4

## 2019-10-31 MED ORDER — SODIUM CHLORIDE 0.9 % IV SOLN
8.0000 mg | Freq: Four times a day (QID) | INTRAVENOUS | Status: DC
  Administered 2019-10-31 (×2): 8 mg via INTRAVENOUS
  Filled 2019-10-31 (×7): qty 4

## 2019-10-31 MED ORDER — ONDANSETRON HCL 4 MG/2ML IJ SOLN
4.0000 mg | INTRAMUSCULAR | Status: AC
  Administered 2019-10-31: 4 mg via INTRAVENOUS
  Filled 2019-10-31: qty 2

## 2019-10-31 MED ORDER — SODIUM CHLORIDE 0.9 % IV BOLUS
1000.0000 mL | Freq: Once | INTRAVENOUS | Status: AC
  Administered 2019-10-31: 1000 mL via INTRAVENOUS

## 2019-10-31 MED ORDER — INSULIN ASPART 100 UNIT/ML ~~LOC~~ SOLN: 0.0000 [IU] | Freq: Three times a day (TID) | SUBCUTANEOUS | Status: DC

## 2019-10-31 MED ORDER — SODIUM CHLORIDE 0.9 % IV BOLUS: 1000.0000 mL | Freq: Once | INTRAVENOUS | Status: AC

## 2019-10-31 MED ORDER — PROMETHAZINE HCL 25 MG PO TABS: 12.5000 mg | ORAL_TABLET | ORAL | Status: DC | PRN

## 2019-10-31 MED ORDER — FAMOTIDINE IN NACL 20-0.9 MG/50ML-% IV SOLN
20.0000 mg | Freq: Two times a day (BID) | INTRAVENOUS | Status: DC
  Administered 2019-10-31 (×2): 20 mg via INTRAVENOUS
  Filled 2019-10-31 (×12): qty 50

## 2019-10-31 MED ORDER — ONDANSETRON HCL 4 MG/2ML IJ SOLN
4.0000 mg | Freq: Four times a day (QID) | INTRAMUSCULAR | Status: DC
  Administered 2019-11-01: 4 mg via INTRAVENOUS
  Filled 2019-10-31 (×2): qty 2

## 2019-10-31 MED ORDER — POTASSIUM CHLORIDE 2 MEQ/ML IV SOLN
INTRAVENOUS | Status: DC
  Filled 2019-10-31 (×10): qty 1000

## 2019-10-31 MED ORDER — THIAMINE HCL 100 MG/ML IJ SOLN
Freq: Once | INTRAVENOUS | Status: AC
  Filled 2019-10-31: qty 1000

## 2019-10-31 MED ORDER — SODIUM CHLORIDE 0.9 % IV SOLN: 1.0000 g | INTRAVENOUS | Status: DC

## 2019-10-31 MED ORDER — MECLIZINE HCL 25 MG PO TABS
25.0000 mg | ORAL_TABLET | Freq: Four times a day (QID) | ORAL | Status: DC
  Filled 2019-10-31 (×19): qty 1

## 2019-10-31 MED ORDER — ONDANSETRON HCL 4 MG/2ML IJ SOLN: 4.0000 mg | Freq: Three times a day (TID) | INTRAMUSCULAR | Status: DC | PRN

## 2019-10-31 NOTE — Progress Notes (Addendum)
ADA Standards of Care 2020 Diabetes in Pregnancy Target Glucose Ranges:  Fasting: 60 - 90 mg/dL Preprandial: 60 - 532 mg/dL 1 hr postprandial: Less than 140mg /dL (from first bite of meal) 2 hr postprandial: Less than 120 mg/dL (from first bit of meal)   Results for JOSIEPHINE, SIMAO (MRN Hedda Slade) as of 10/31/2019 12:03  Ref. Range 10/31/2019 06:47 10/31/2019 11:36  Glucose-Capillary Latest Ref Range: 70 - 99 mg/dL 01/01/2020 (H) 95   Admit with: Nausea and vomiting in pregnancy (11 weeks)  History: DM (since age 33)  Home DM Meds: Toujeo 10 units BID       Novolog 5 units TID with meals       Trulicity 0.75 mg Qweek          Metformin (stopped 2 weeks ago)  Current Orders: 12 SSI (0-9 units) TID AC (changed to Q4 hour coverage this AM)    MD- Please consider changing the frequency of the Novolog SSI to Q4 hours while pt NPO and unable to eat  When patient is discharged home, do not recommend continuing the Trulicity once weekly.  Per the FDA drug data, "TRULICITY should be used during pregnancy only if the potential benefit justifies the potential risk to fetus"    Per MD notes, patient has not taken any insulin in over a week due to concerns for Hypoglycemia  Endocrinologist: Dr. Insurance account manager with [facility] seen in office 08/25/2019--Was told to take Toujeo 20 units Daily + Regular SSI (2-10 units) + Metformin 1000 mg BID.  Dr. 08/27/2019 also planned to start the patient on Trulicity at that visit.  Prior to that visit with Dr. Gershon Crane, pt was supposed to be taking NPH Insulin 26 units AM/ 12 units QHS + Regular Insulin 10 units with Breakfast and 6 units with Dinner.  Per Chart Review, looks like patient is [facility] during [facility].    Addendum 12pm--Spoke with Gershon Crane, CNM this AM.  Per CNM, pt has very complicated history (multiple miscarriages, child born with heart  defect, history of uncontrolled diabetes, takes Lithium for depression).  Based on the info in Dr. Paula Naramore notes, pt's most recent C-Peptide level and not being in DKA on presentation (hasn't taken insulin for a week per MD notes), it looks as if patient trends more towards Type 2 diabetes.  Per CNM, pt has not seen the high risk maternity clinic yet for further diabetes management.  CNM plans to contact Attending OBGYN today for further discussion of plan of care for this patient.  I attempted to speak w/ pt this AM about her home diabetes care regimen.  Pt was sleeping and would not open her eyes to my voice at all despite multiple times to wake her.  Per RN on the floor, pt was given nausea meds earlier.  Diabetes Coordinator available by pager until 5pm today and Diabetes Coordinator will be back on campus tomorrow to assist with glucose management if desired.    --Will follow patient during hospitalization--  Elberta Fortis RN, MSN, CDE Diabetes Coordinator Inpatient Glycemic Control Team Team Pager: [phone] (8a-5p)
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
## 2019-10-31 NOTE — ED Notes (Signed)
EDMD at bedside

## 2019-10-31 NOTE — ED Notes (Signed)
Pt ambulatory to the bathroom 

## 2019-10-31 NOTE — ED Notes (Signed)
Pt resting in subwait with warm blanket.

## 2019-10-31 NOTE — ED Triage Notes (Signed)
Pt states is [redacted] weeks pregnant and has not been able to keep "anything down for two days". Pt retching in triage.

## 2019-10-31 NOTE — H&P (Signed)
Madison Harrell is an 29 y.o. female.   Chief Complaint: Nausea and vomiting in pregnancy  HPI: Madison Harrell presents today from home with complaints of severe nausea vomiting.  She reports that she has been having 4-5 episodes of emesis today.  She has not been able to keep food or water down.  She has been taking Zofran 4-5 times a day but continues to vomit.  She thinks she may also take Diclegis but is not certain.  She was diagnosed with UTI on 10/28/2019 but she has not been able to take her oral medication.  She has a history of hyperemesis with previous pregnancies. Took zofran with her prior pregnancies which worked to control her symptoms.  She has not felt better with IV fluids or IV zofran which have been given in the ER.   She has a history of diabetes mellitus.  However she has not taken insulin in over a week given concerns that she has regarding hypoglycemia. Insulin Regimen Normally :   Long-acting Toujeo- 10 units twice a day Novalog- 5 units with every meal   She has been taking a once a week injection medicine-Trulicity 0.75mg . she was supposed to take this Sunday but has not had it thus far.  She also stopped metformin about 1.5-2 weeks ago.  Dr. Gershon Crane is her endocrinologist.  She has not seen Dr. Gershon Crane since she found out she is pregnant.  She used to take lithium for depression. She stopped taking in 2 weeks ago. She sees a Licensed conveyancer who prescribes this.   2020 baby was born with a heart defect ( "truncus" per patient)  She was not using birth control when she conceived.    Nursing Staff Provider  Office Location  [facility] Dating   LMP = 6 wk [place]  Language  English Anatomy US    Flu Vaccine   Genetic Screen  NIPS:   AFP:   First Screen:    TDaP vaccine    Hgb A1C or  GTT Early : hgba1c:10.2 Third trimester :   Rhogam   not needed   LAB RESULTS   Feeding Plan  Blood Type O/Positive/-- (06/02 1533)   Contraception  Antibody Negative (06/02 1533)   Circumcision  Rubella 1.95 (06/02 1533)  Pediatrician   RPR Non Reactive (06/02 1533)   Support Person  HBsAg Negative (06/02 1533)   Prenatal Classes  HIV Non Reactive (06/02 1533)    Varicella  Immune  BTL Consent  GBS  (For PCN allergy, check sensitivities)        VBAC Consent  Pap  ASCUS HPV+, colpo- CIN 1    Hgb Electro      CF      SMA           Past Medical History:  Diagnosis Date  . Diabetes mellitus without complication (HCC)   . pre E     Past Surgical History:  Procedure Laterality Date  . CESAREAN SECTION  2013    Family History  Problem Relation Age of Onset  . Diabetes Father    Social History:  reports that she has quit smoking. Her smoking use included cigarettes. She has never used smokeless tobacco. She reports that she does not drink alcohol and does not use drugs.  Allergies: No Known Allergies  (Not in a hospital admission)   Results for orders placed or performed during the hospital encounter of 10/31/19 (from the past 48 hour(s))  Comprehensive metabolic panel  Status: Abnormal   Collection Time: 10/31/19 12:40 AM  Result Value Ref Range   Sodium 137 135 - 145 mmol/L   Potassium 3.3 (L) 3.5 - 5.1 mmol/L   Chloride 101 98 - 111 mmol/L   CO2 22 22 - 32 mmol/L   Glucose, Bld 131 (H) 70 - 99 mg/dL    Comment: Glucose reference range applies only to samples taken after fasting for at least 8 hours.   BUN 20 6 - 20 mg/dL   Creatinine, Ser 3.57 0.44 - 1.00 mg/dL   Calcium 9.1 8.9 - 01.7 mg/dL   Total Protein 8.4 (H) 6.5 - 8.1 g/dL   Albumin 4.3 3.5 - 5.0 g/dL   AST 34 15 - 41 U/L   ALT 46 (H) 0 - 44 U/L   Alkaline Phosphatase 68 38 - 126 U/L   Total Bilirubin 2.0 (H) 0.3 - 1.2 mg/dL   GFR calc non Af Amer >60 >60 mL/min   GFR calc Af Amer >60 >60 mL/min   Anion gap 14 5 - 15    Comment: Performed at [facility], [address] [place], [place] [address]  CBC     Status: None   Collection Time: 10/31/19 12:40 AM  Result  Value Ref Range   WBC 7.9 4.0 - 10.5 K/uL   RBC 4.77 3.87 - 5.11 MIL/uL   Hemoglobin 14.4 12.0 - 15.0 g/dL   HCT 30.0 36 - 46 %   MCV 84.5 80.0 - 100.0 fL   MCH 30.2 26.0 - 34.0 pg   MCHC 35.7 30.0 - 36.0 g/dL   RDW 92.3 30.0 - 76.2 %   Platelets 365 150 - 400 K/uL   nRBC 0.0 0.0 - 0.2 %    Comment: Performed at [facility], [address], [place], [place] [address]  Beta-hydroxybutyric acid     Status: Abnormal   Collection Time: 10/31/19 12:40 AM  Result Value Ref Range   Beta-Hydroxybutyric Acid 0.76 (H) 0.05 - 0.27 mmol/L    Comment: Performed at [facility], [address] [place], [place] [address]  Glucose, capillary     Status: Abnormal   Collection Time: 10/31/19  6:47 AM  Result Value Ref Range   Glucose-Capillary 120 (H) 70 - 99 mg/dL    Comment: Glucose reference range applies only to samples taken after fasting for at least 8 hours.   No results found.  Review of Systems  Constitutional: Negative for chills and fever.  HENT: Negative for congestion, hearing loss and sinus pain.   Respiratory: Negative for cough, shortness of breath and wheezing.   Cardiovascular: Negative for chest pain, palpitations and leg swelling.  Gastrointestinal: Negative for abdominal pain, constipation, diarrhea, nausea and vomiting.  Genitourinary: Negative for dysuria, flank pain, frequency, hematuria and urgency.  Musculoskeletal: Negative for back pain.  Skin: Negative for rash.  Neurological: Negative for dizziness and headaches.  Psychiatric/Behavioral: Negative for suicidal ideas. The patient is not nervous/anxious.     Blood pressure 126/87, pulse 88, temperature 98.6 F (37 C), temperature source Oral, resp. rate 16, height 5' (1.524 m), weight 71 kg, last menstrual period 08/14/2019, SpO2 100 %, not currently breastfeeding. Physical Exam Vitals and nursing note reviewed.  Constitutional:      Appearance: She is well-developed.  HENT:     Head:  Normocephalic and atraumatic.  Cardiovascular:     Rate and Rhythm: Normal rate and regular rhythm.  Pulmonary:     Effort: Pulmonary effort  is normal.     Breath sounds: Normal breath sounds.  Abdominal:     General: Bowel sounds are normal.     Palpations: Abdomen is soft.  Musculoskeletal:        General: Normal range of motion.  Skin:    General: Skin is warm and dry.  Neurological:     Mental Status: She is alert and oriented to person, place, and time.  Psychiatric:        Behavior: Behavior normal.        Thought Content: Thought content normal.        Judgment: Judgment normal.    Assessment/Plan 29 yo with hyperemesis 1. Will start IV fluids and IV/po/rectal medication for nausea and vomiting 2. UTI- no tolerating PO, will treat with IV antibiotics.  3. IDDM- patient has not been taking insulin medications at home. Will closely monitor and give insulin sliding scale if needed. Will consider restarting long and short acting medicine if patient begins eating or blood glucose are elevated. Diabetes care manager consulted. 4. Hypokalemia- replace with IV potassium  More than 50 minutes were spent face to face with the patient in the room, reviewing the medical record, labs and images, and coordinating care for the patient. The plan of management was discussed in detail and counseling was provided.    Adelene Idler MD, [facility], [facility] 10/31/2019 8:55 AM    Natale Milch, MD 10/31/2019, 7:25 AM

## 2019-11-01 LAB — GLUCOSE, CAPILLARY
Glucose-Capillary: 125 mg/dL — ABNORMAL HIGH (ref 70–99)
Glucose-Capillary: 133 mg/dL — ABNORMAL HIGH (ref 70–99)
Glucose-Capillary: 164 mg/dL — ABNORMAL HIGH (ref 70–99)
Glucose-Capillary: 71 mg/dL (ref 70–99)
Glucose-Capillary: 87 mg/dL (ref 70–99)
Glucose-Capillary: 93 mg/dL (ref 70–99)
Glucose-Capillary: 93 mg/dL (ref 70–99)

## 2019-11-01 LAB — ELECTROLYTE PANEL
Anion gap: 8 (ref 5–15)
CO2: 20 mmol/L — ABNORMAL LOW (ref 22–32)
Chloride: 108 mmol/L (ref 98–111)
Potassium: 3.2 mmol/L — ABNORMAL LOW (ref 3.5–5.1)
Sodium: 136 mmol/L (ref 135–145)

## 2019-11-01 MED ORDER — MENTHOL 3 MG MT LOZG
1.0000 | LOZENGE | OROMUCOSAL | Status: DC | PRN
  Administered 2019-11-01: 3 mg via ORAL
  Filled 2019-11-01: qty 9

## 2019-11-01 MED ORDER — SODIUM CHLORIDE 0.9 % IV SOLN
8.0000 mg | Freq: Four times a day (QID) | INTRAVENOUS | Status: DC
  Filled 2019-11-01 (×14): qty 4

## 2019-11-01 MED ORDER — ONDANSETRON 4 MG PO TBDP
4.0000 mg | ORAL_TABLET | Freq: Four times a day (QID) | ORAL | Status: DC
  Administered 2019-11-01 (×2): 4 mg via ORAL
  Administered 2019-11-02: 8 mg via ORAL
  Administered 2019-11-02 (×3): 4 mg via ORAL
  Administered 2019-11-03 (×2): 8 mg via ORAL
  Administered 2019-11-03 (×2): 4 mg via ORAL
  Administered 2019-11-04: 8 mg via ORAL
  Filled 2019-11-01: qty 2
  Filled 2019-11-01 (×4): qty 1
  Filled 2019-11-01: qty 2
  Filled 2019-11-01: qty 1
  Filled 2019-11-01 (×2): qty 2
  Filled 2019-11-01 (×2): qty 1

## 2019-11-01 MED ORDER — ONDANSETRON HCL 4 MG/2ML IJ SOLN: 4.0000 mg | Freq: Four times a day (QID) | INTRAMUSCULAR | Status: DC

## 2019-11-01 NOTE — Progress Notes (Signed)
Patient tolerated carb modified diet at lunch (chicken noodle soup and 2 ice pops). No complaints of vomiting and very little nausea. Patient also able to tolerate 1 PO zofran and 1 PO pepcid tab today. Refused PO meclizine, stating "I don't want to overdue it." Patient voiding adequately. Patient more alert and "feeling much better" this afternoon. RN will continue to monitor.

## 2019-11-01 NOTE — Progress Notes (Signed)
Inpatient Diabetes Program Recommendations  Diabetes Treatment Program Recommendations  ADA Standards of Care 2021 Diabetes in Pregnancy Target Glucose Ranges:  Fasting: 60 - 90 mg/dL Preprandial: 60 - 105 mg/dL 1 hr postprandial: Less than 156m/dL (from first bite of meal) 2 hr postprandial: Less than 120 mg/dL (from first bite of meal)  Results for CSHEWANDA, SHARPE(MRN [id number] as of 11/01/2019 12:52  Ref. Range 11/01/2019 00:24 11/01/2019 04:42 11/01/2019 08:32 11/01/2019 12:19  Glucose-Capillary Latest Ref Range: 70 - 99 mg/dL 87 71 93 93   Results for CZAFIRAH, VANZEE(MRN [id number] as of 11/01/2019 12:52  Ref. Range 10/31/2019 06:47 10/31/2019 11:36 10/31/2019 15:34 10/31/2019 20:08  Glucose-Capillary Latest Ref Range: 70 - 99 mg/dL 120 (H) 95 89 79   Review of Glycemic Control  Diabetes history: DM2 Outpatient Diabetes medications: Toujeo 10 units BID, Novolog 5 units TID with meals, Trulicity 02.11mg Qweek (has been on for about 2 months), Metformin (stopped 2 weeks prior to admission) Current orders for Inpatient glycemic control: CBGs   NOTE: Spoke with patient regarding DM management during pregnancy. Patient lying in bed and opened eyes initially during conversation but then kept eyes closed during most of the conversation. Patient states that she is still nauseous and has not eaten any solid food yet. Patient reports that this is her 3rd pregnancy and she took DM medications during prior pregnancies.   Discussed glucose trends over the past 2 days and explained that she has not been given any insulin while inpatient. Discussed how insulin needs can decrease in first trimester and then increase in second and third trimester. Reviewed glucose goals during pregnancy. Patient states that she is planning to continue to see Dr. OHonor Junes(Endocrinologist) during her pregnancy. Encouraged patient to be sure to call and make an appointment with Dr. OHonor Junesif she does not already have one  scheduled. Patient reports that she needs a prescription for a new glucometer and testing supplies.  Patient appreciative of information discussed. Patient states that she has no questions or concerns at this time related to DM. At time of discharge, please provide Rx for: glucose monitoring kit ((#[id number].  Thanks, MBarnie Alderman RN, MSN, CDE Diabetes Coordinator Inpatient Diabetes Program [phone](Team Pager from 8am to 5pm)

## 2019-11-01 NOTE — Progress Notes (Signed)
Vitals stable and WDL. Patient still having nausea this morning. No vomiting overnight or this morning. Scheduled IV zofran and phenergan given. Patient tolerated crackers, PO liquids, and chicken broth overnight. Patient on a clear liquid diet and to attempt more clear liquids this morning. FHT 154. Pt. to start attempting PO meds today. Voiding adquately. No complaints of pain, bleeding, LOF. RN will continue to monitor.

## 2019-11-01 NOTE — Progress Notes (Signed)
Patient ID: Madison Harrell, female   DOB: 12-25-1990, 29 y.o.   MRN: [id number]   Subjective:  She is resting in bed. She reports tolerating crackers overnight. She is not tolerating oral pills and is hesitant to try. She has a sore throat. She is going to try a popsicle this morning.     Objective:   Blood pressure 109/74, pulse 86, temperature 99.1 F (37.3 C), temperature source Oral, resp. rate 18, height 5' (1.524 m), weight 71.1 kg, last menstrual period 08/14/2019, SpO2 99 %, not currently breastfeeding.  General: NAD Pulmonary: no increased work of breathing Abdomen: non-distended, non-tender Extremities: no edema, no erythema, no tenderness, no signs of DVT  Results for orders placed or performed during the hospital encounter of 10/31/19 (from the past 72 hour(s))  Comprehensive metabolic panel     Status: Abnormal   Collection Time: 10/31/19 12:40 AM  Result Value Ref Range   Sodium 137 135 - 145 mmol/L   Potassium 3.3 (L) 3.5 - 5.1 mmol/L   Chloride 101 98 - 111 mmol/L   CO2 22 22 - 32 mmol/L   Glucose, Bld 131 (H) 70 - 99 mg/dL    Comment: Glucose reference range applies only to samples taken after fasting for at least 8 hours.   BUN 20 6 - 20 mg/dL   Creatinine, Ser 2.37 0.44 - 1.00 mg/dL   Calcium 9.1 8.9 - 62.8 mg/dL   Total Protein 8.4 (H) 6.5 - 8.1 g/dL   Albumin 4.3 3.5 - 5.0 g/dL   AST 34 15 - 41 U/L   ALT 46 (H) 0 - 44 U/L   Alkaline Phosphatase 68 38 - 126 U/L   Total Bilirubin 2.0 (H) 0.3 - 1.2 mg/dL   GFR calc non Af Amer >60 >60 mL/min   GFR calc Af Amer >60 >60 mL/min   Anion gap 14 5 - 15    Comment: Performed at [facility], [address] [place], [place] [address]  CBC     Status: None   Collection Time: 10/31/19 12:40 AM  Result Value Ref Range   WBC 7.9 4.0 - 10.5 K/uL   RBC 4.77 3.87 - 5.11 MIL/uL   Hemoglobin 14.4 12.0 - 15.0 g/dL   HCT 61.6 36 - 46 %   MCV 84.5 80.0 - 100.0 fL   MCH 30.2 26.0 - 34.0 pg   MCHC 35.7 30.0 -  36.0 g/dL   RDW 07.3 71.0 - 62.6 %   Platelets 365 150 - 400 K/uL   nRBC 0.0 0.0 - 0.2 %    Comment: Performed at [facility], [address] [place], [place] [address]  Beta-hydroxybutyric acid     Status: Abnormal   Collection Time: 10/31/19 12:40 AM  Result Value Ref Range   Beta-Hydroxybutyric Acid 0.76 (H) 0.05 - 0.27 mmol/L    Comment: Performed at [facility], [address] [place], [place] [address]  Glucose, capillary     Status: Abnormal   Collection Time: 10/31/19  6:47 AM  Result Value Ref Range   Glucose-Capillary 120 (H) 70 - 99 mg/dL    Comment: Glucose reference range applies only to samples taken after fasting for at least 8 hours.  Urinalysis, Complete w Microscopic     Status: Abnormal   Collection Time: 10/31/19  6:51 AM  Result Value Ref Range   Color, Urine AMBER (A) YELLOW    Comment: BIOCHEMICALS MAY BE AFFECTED BY COLOR   APPearance CLOUDY (  A) CLEAR   Specific Gravity, Urine 1.028 1.005 - 1.030   pH 6.0 5.0 - 8.0   Glucose, UA NEGATIVE NEGATIVE mg/dL   Hgb urine dipstick NEGATIVE NEGATIVE   Bilirubin Urine SMALL (A) NEGATIVE   Ketones, ur 80 (A) NEGATIVE mg/dL   Protein, ur >=384 (A) NEGATIVE mg/dL   Nitrite NEGATIVE NEGATIVE   Leukocytes,Ua SMALL (A) NEGATIVE   RBC / HPF 0-5 0 - 5 RBC/hpf   WBC, UA >50 (H) 0 - 5 WBC/hpf   Bacteria, UA MANY (A) NONE SEEN   Squamous Epithelial / LPF 21-50 0 - 5   WBC Clumps PRESENT    Mucus PRESENT     Comment: Performed at [facility], [address] [place], [place] [address]  SARS Coronavirus 2 by RT PCR (hospital order, performed in [facility] hospital lab) Nasopharyngeal Nasopharyngeal Swab     Status: None   Collection Time: 10/31/19  6:51 AM   Specimen: Nasopharyngeal Swab  Result Value Ref Range   SARS Coronavirus 2 NEGATIVE NEGATIVE    Comment: (NOTE) SARS-CoV-2 target nucleic acids are NOT DETECTED.  The SARS-CoV-2 RNA is generally detectable in upper and  lower respiratory specimens during the acute phase of infection. The lowest concentration of SARS-CoV-2 viral copies this assay can detect is 250 copies / mL. A negative result does not preclude SARS-CoV-2 infection and should not be used as the sole basis for treatment or other patient management decisions.  A negative result may occur with improper specimen collection / handling, submission of specimen other than nasopharyngeal swab, presence of viral mutation(s) within the areas targeted by this assay, and inadequate number of viral copies (<250 copies / mL). A negative result must be combined with clinical observations, patient history, and epidemiological information.  Fact Sheet for Patients:   BoilerBrush.com.cy  Fact Sheet for Healthcare Providers: https://pope.com/  This test is not yet approved or  cleared by the [place] FDA and has been authorized for detection and/or diagnosis of SARS-CoV-2 by FDA under an Emergency Use Authorization (EUA).  This EUA will remain in effect (meaning this test can be used) for the duration of the COVID-19 declaration under Section 564(b)(1) of the Act, 21 U.S.C. section 360bbb-3(b)(1), unless the authorization is terminated or revoked sooner.  Performed at [facility], [address] [place], [place] [address]   Glucose, capillary     Status: None   Collection Time: 10/31/19 11:36 AM  Result Value Ref Range   Glucose-Capillary 95 70 - 99 mg/dL    Comment: Glucose reference range applies only to samples taken after fasting for at least 8 hours.  Glucose, capillary     Status: None   Collection Time: 10/31/19  3:34 PM  Result Value Ref Range   Glucose-Capillary 89 70 - 99 mg/dL    Comment: Glucose reference range applies only to samples taken after fasting for at least 8 hours.  Glucose, capillary     Status: None   Collection Time: 10/31/19  8:08 PM  Result Value Ref  Range   Glucose-Capillary 79 70 - 99 mg/dL    Comment: Glucose reference range applies only to samples taken after fasting for at least 8 hours.   Comment 1 Notify RN    Comment 2 Document in Chart   Glucose, capillary     Status: None   Collection Time: 11/01/19 12:24 AM  Result Value Ref Range   Glucose-Capillary 87 70 - 99 mg/dL  Comment: Glucose reference range applies only to samples taken after fasting for at least 8 hours.   Comment 1 Notify RN    Comment 2 Document in Chart   Glucose, capillary     Status: None   Collection Time: 11/01/19  4:42 AM  Result Value Ref Range   Glucose-Capillary 71 70 - 99 mg/dL    Comment: Glucose reference range applies only to samples taken after fasting for at least 8 hours.   Comment 1 Notify RN    Comment 2 Document in Chart   Glucose, capillary     Status: None   Collection Time: 11/01/19  8:32 AM  Result Value Ref Range   Glucose-Capillary 93 70 - 99 mg/dL    Comment: Glucose reference range applies only to samples taken after fasting for at least 8 hours.    Assessment:   29 y.o. [id number] with hyperemesis- hospital day #2  Plan:    1) Hyperemesis- continue IV medications and IV fluids  2) Diet- advance as tolerated   3) IDDM- every 4 hour glucose check  4) Replace electrolytes as needed.  5) UTI- continue rocephin IV, patient not tolerating oral medication  6) Disposition- continue care  7) GI/DVT prophylaxis- SCDs and pepcid  Adelene Idler MD, [facility], [facility] 11/01/2019 10:01 AM

## 2019-11-02 DIAGNOSIS — O2341 Unspecified infection of urinary tract in pregnancy, first trimester: Secondary | ICD-10-CM

## 2019-11-02 LAB — BASIC METABOLIC PANEL
Anion gap: 8 (ref 5–15)
BUN: 5 mg/dL — ABNORMAL LOW (ref 6–20)
CO2: 24 mmol/L (ref 22–32)
Calcium: 9 mg/dL (ref 8.9–10.3)
Chloride: 105 mmol/L (ref 98–111)
Creatinine, Ser: 0.48 mg/dL (ref 0.44–1.00)
GFR calc Af Amer: >60 mL/min (ref >60)
GFR calc non Af Amer: >60 mL/min (ref >60)
Glucose, Bld: 123 mg/dL — ABNORMAL HIGH (ref 70–99)
Potassium: 4.2 mmol/L (ref 3.5–5.1)
Sodium: 137 mmol/L (ref 135–145)

## 2019-11-02 LAB — GLUCOSE, CAPILLARY
Glucose-Capillary: 113 mg/dL — ABNORMAL HIGH (ref 70–99)
Glucose-Capillary: 120 mg/dL — ABNORMAL HIGH (ref 70–99)
Glucose-Capillary: 127 mg/dL — ABNORMAL HIGH (ref 70–99)
Glucose-Capillary: 130 mg/dL — ABNORMAL HIGH (ref 70–99)
Glucose-Capillary: 139 mg/dL — ABNORMAL HIGH (ref 70–99)
Glucose-Capillary: 180 mg/dL — ABNORMAL HIGH (ref 70–99)

## 2019-11-02 MED ORDER — LACTATED RINGERS IV SOLN
INTRAVENOUS | Status: DC
  Filled 2019-11-02: qty 1000

## 2019-11-02 MED ORDER — CEFAZOLIN SODIUM-DEXTROSE 1-4 GM/50ML-% IV SOLN
1.0000 g | Freq: Three times a day (TID) | INTRAVENOUS | Status: DC
  Administered 2019-11-02: 1 g via INTRAVENOUS
  Filled 2019-11-02 (×4): qty 50

## 2019-11-02 MED ORDER — PROMETHAZINE HCL 25 MG PO TABS
12.5000 mg | ORAL_TABLET | Freq: Four times a day (QID) | ORAL | Status: DC | PRN
  Administered 2019-11-03 – 2019-11-04 (×3): 12.5 mg via ORAL
  Filled 2019-11-02 (×6): qty 1

## 2019-11-02 MED ORDER — THIAMINE HCL 100 MG/ML IJ SOLN
Freq: Every day | INTRAVENOUS | Status: DC
  Filled 2019-11-02 (×3): qty 1000

## 2019-11-02 MED ORDER — CEFAZOLIN SODIUM-DEXTROSE 1-4 GM/50ML-% IV SOLN
1.0000 g | Freq: Three times a day (TID) | INTRAVENOUS | Status: DC
  Administered 2019-11-03 (×2): 1 g via INTRAVENOUS
  Filled 2019-11-02 (×5): qty 50

## 2019-11-02 MED ORDER — PROMETHAZINE HCL 25 MG/ML IJ SOLN: 12.5000 mg | Freq: Four times a day (QID) | INTRAMUSCULAR | Status: DC

## 2019-11-02 NOTE — Progress Notes (Addendum)
Admission Day: 31 October 2019 Today's date 02 November 2019  Hyperemesis/ IDDM at 11wk3d/ E coli UTI-pansensitive ( on Rocephin)   S: Feeling much better. Was able to keep down some chicken broth and grilled cheese sandwich yesterday. Vomited after trying to eat eggs and sausage this AM, but was able to keep down some fruit and a baked potato.  Received her care with two prior pregnancies at [facility]. Discussed transfer of care, once well to [facility], with which she is amenable Receiving Zofran alternating with Phenergan IV.   O: BP 112/75 (BP Location: Right Arm)   Pulse 93   Temp 98.1 F (36.7 C) (Oral)   Resp 18   Ht 5' (1.524 m)   Wt 71.8 kg   LMP 08/14/2019   SpO2 96%   BMI 30.91 kg/m    General: WF in NAD, smiling Heart: RRR without murmur Chest/ Respiratory: normal respiratory effort, CTAB Abdomen: soft, NT, FHT 163 Skin: normal skin turgor Psyche: normal mood Results for orders placed or performed during the hospital encounter of 10/31/19 (from the past 24 hour(s))  Glucose, capillary     Status: Abnormal   Collection Time: 11/01/19  4:25 PM  Result Value Ref Range   Glucose-Capillary 133 (H) 70 - 99 mg/dL   Comment 1 Notify RN   Glucose, capillary     Status: Abnormal   Collection Time: 11/01/19  8:14 PM  Result Value Ref Range   Glucose-Capillary 164 (H) 70 - 99 mg/dL  Glucose, capillary     Status: Abnormal   Collection Time: 11/01/19 11:37 PM  Result Value Ref Range   Glucose-Capillary 125 (H) 70 - 99 mg/dL  Glucose, capillary     Status: Abnormal   Collection Time: 11/02/19  3:47 AM  Result Value Ref Range   Glucose-Capillary 127 (H) 70 - 99 mg/dL   Comment 1 Notify RN    Comment 2 Document in Chart   Glucose, capillary     Status: Abnormal   Collection Time: 11/02/19  7:55 AM  Result Value Ref Range   Glucose-Capillary 113 (H) 70 - 99 mg/dL   Comment 1 Notify RN   Glucose, capillary     Status: Abnormal   Collection Time: 11/02/19  1:03 PM  Result Value Ref Range    Glucose-Capillary 130 (H) 70 - 99 mg/dL  Basic metabolic panel     Status: Abnormal   Collection Time: 11/02/19  1:57 PM  Result Value Ref Range   Sodium 137 135 - 145 mmol/L   Potassium 4.2 3.5 - 5.1 mmol/L   Chloride 105 98 - 111 mmol/L   CO2 24 22 - 32 mmol/L   Glucose, Bld 123 (H) 70 - 99 mg/dL   BUN 5 (L) 6 - 20 mg/dL   Creatinine, Ser 1.02 0.44 - 1.00 mg/dL   Calcium 9.0 8.9 - 58.5 mg/dL   GFR calc non Af Amer >60 >60 mL/min   GFR calc Af Amer >60 >60 mL/min   Anion gap 8 5 - 15   A/P: IUP at 11wk3d with hyperemesis-feeling better on current regime Normal potassium. Will discontinue potassium from LR and keep in banana bag daily. Try oral antiemetics tomorrow. Will get EKG to check QT interval as suggested by pharmacy since on scheduled Zofram.   IDDM- CBGs ranged from 93-164 since admission, in the last 12 hours CBGs 113-130. Continue sliding scale every 4 hours. Diabetic Coordinator recommends adding 2 U Novolog with meals when keeping down at  least 50% of meal.   Ecoli UTI: pharmacy suggests switching to Ancef 1 GM q8h since E. Coli is pansensitive.  Farrel Conners, CNM

## 2019-11-02 NOTE — Progress Notes (Signed)
Inpatient Diabetes Program Recommendations  AACE/ADA: New Consensus Statement on Inpatient Glycemic Control   Target Ranges:  Prepandial:   less than 140 mg/dL      Peak postprandial:   less than 180 mg/dL (1-2 hours)      Critically ill patients:  140 - 180 mg/dL  Results for Madison Harrell, Madison Harrell (MRN [id number]) as of 11/02/2019 10:34  Ref. Range 11/02/2019 03:47 11/02/2019 07:55  Glucose-Capillary Latest Ref Range: 70 - 99 mg/dL 458 (H)  Novolog 1 unit 113 (H)   Results for Madison Harrell, Madison Harrell (MRN [id number]) as of 11/02/2019 10:34  Ref. Range 11/01/2019 08:32 11/01/2019 12:19 11/01/2019 16:25 11/01/2019 20:14 11/01/2019 23:37  Glucose-Capillary Latest Ref Range: 70 - 99 mg/dL 93 93 053 (H)  Novolog 1 unit 164 (H)  Novolog 2 unit 125 (H)  Novolog 1 unit   Review of Glycemic Control  Diabetes history: DM2 Outpatient Diabetes medications: Toujeo 10 units BID, Novolog 5 units TID with meals, Trulicity 0.75 mg Qweek (has been on for about 2 months), Metformin (stopped 2 weeks prior to admission) Current orders for Inpatient glycemic control: Novolog 0-9 units Q4H  Inpatient Diabetes Program Recommendations:    Insulin-May want to consider ordering Novolog 2 units TID with meals for meal coverage if patient is able to eat at least 50% of meals.  Thanks, Orlando Penner, RN, MSN, CDE Diabetes Coordinator Inpatient Diabetes Program [phone] (Team Pager from 8am to 5pm)

## 2019-11-03 LAB — GLUCOSE, CAPILLARY
Glucose-Capillary: 94 mg/dL (ref 70–99)
Glucose-Capillary: 173 mg/dL — ABNORMAL HIGH (ref 70–99)
Glucose-Capillary: 119 mg/dL — ABNORMAL HIGH (ref 70–99)
Glucose-Capillary: 149 mg/dL — ABNORMAL HIGH (ref 70–99)
Glucose-Capillary: 166 mg/dL — ABNORMAL HIGH (ref 70–99)

## 2019-11-03 MED ORDER — INSULIN ASPART 100 UNIT/ML ~~LOC~~ SOLN
4.0000 [IU] | Freq: Three times a day (TID) | SUBCUTANEOUS | Status: DC
  Administered 2019-11-03 – 2019-11-04 (×3): 4 [IU] via SUBCUTANEOUS
  Filled 2019-11-03 (×3): qty 1

## 2019-11-03 MED ORDER — CEPHALEXIN 500 MG PO CAPS
500.0000 mg | ORAL_CAPSULE | Freq: Four times a day (QID) | ORAL | Status: DC
  Administered 2019-11-03 – 2019-11-04 (×4): 500 mg via ORAL
  Filled 2019-11-03 (×5): qty 1

## 2019-11-03 MED ORDER — INSULIN ASPART 100 UNIT/ML ~~LOC~~ SOLN: 2.0000 [IU] | Freq: Three times a day (TID) | SUBCUTANEOUS | Status: DC

## 2019-11-03 MED ORDER — SODIUM CHLORIDE 0.9 % IV SOLN: 250.0000 mL | INTRAVENOUS | Status: DC | PRN

## 2019-11-03 MED ORDER — INSULIN ASPART 100 UNIT/ML ~~LOC~~ SOLN: 0.0000 [IU] | Freq: Four times a day (QID) | SUBCUTANEOUS | Status: DC

## 2019-11-03 MED ORDER — INSULIN ASPART 100 UNIT/ML ~~LOC~~ SOLN
0.0000 [IU] | Freq: Four times a day (QID) | SUBCUTANEOUS | Status: DC
  Administered 2019-11-03: 2 [IU] via SUBCUTANEOUS
  Filled 2019-11-03: qty 1

## 2019-11-03 MED ORDER — INSULIN ASPART 100 UNIT/ML ~~LOC~~ SOLN
0.0000 [IU] | Freq: Three times a day (TID) | SUBCUTANEOUS | Status: DC
  Administered 2019-11-03: 2 [IU] via SUBCUTANEOUS
  Administered 2019-11-03 – 2019-11-04 (×2): 3 [IU] via SUBCUTANEOUS
  Filled 2019-11-03 (×3): qty 1

## 2019-11-03 MED ORDER — SODIUM CHLORIDE 0.9% FLUSH: 3.0000 mL | Freq: Two times a day (BID) | INTRAVENOUS | Status: DC

## 2019-11-03 MED ORDER — SODIUM CHLORIDE 0.9% FLUSH: 3.0000 mL | INTRAVENOUS | Status: DC | PRN

## 2019-11-03 MED ORDER — INSULIN ASPART 100 UNIT/ML ~~LOC~~ SOLN
2.0000 [IU] | Freq: Three times a day (TID) | SUBCUTANEOUS | Status: DC
  Administered 2019-11-03 (×2): 2 [IU] via SUBCUTANEOUS
  Filled 2019-11-03 (×2): qty 1

## 2019-11-03 NOTE — Progress Notes (Signed)
Daily Antepartum Note  Admission Date: 10/31/2019 Current Date: 11/03/2019 12:27 PM  Madison Harrell is a 29 y.o. [id number] @ [redacted]w[redacted]d by LMP and ultrasound, HD#4, admitted for uncontrolled IDDM and hyperemesis..   Pregnancy complicated by: pregnancy 7  Problems (from 08/24/19 to present)    Problem Noted Resolved   High-risk pregnancy, first trimester 09/28/2019 by Nadara Mustard, MD No   Overview Addendum 10/31/2019  2:26 PM by Conard Novak, MD    - h/o preterm delivery x 2 for severe preeclampsia - h/o diabetic nephropathy/retinopathy per [facility] records - h/o tetrology of Fallot with last preg - h/o c-section with successful VBAC last pregnancy at 33w - h/o hyperemesis  - h/o severe depression/anxiety - h/o chronic htn - XFER patient when able Nursing Staff Provider  Office Location  [facility] Dating   LMP = 6 wk [place]  Language  English Anatomy US    [ ]  fetal echo  Flu Vaccine   Genetic Screen  NIPS:   AFP:   First Screen:    TDaP vaccine    Hgb A1C or  GTT Early : hgba1c:10.2 Third trimester :   Rhogam   not needed   LAB RESULTS   Feeding Plan  Blood Type O/Positive/-- (06/02 1533)   Contraception  Antibody Negative (06/02 1533)  Circumcision  Rubella 1.95 (06/02 1533)  Pediatrician   RPR Non Reactive (06/02 1533)   Support Person  HBsAg Negative (06/02 1533)   Prenatal Classes  HIV Non Reactive (06/02 1533)    Varicella  Immune  BTL Consent  GBS  (For PCN allergy, check sensitivities)        VBAC Consent  Pap  ASCUS HPV+, colpo- CIN 1    Hgb Electro      CF      SMA              Previous Version        Overnight/24hr events:  Patient has been treated with antiemetics, IV fluid hydration, and is feeling remarkedly better.she has not had episodes of vomiting over the last 24 hours.  Subjective:  Feeling better and would like to continue advancing her diet.   Objective:   Vitals:   11/03/19 0800 11/03/19 1130  BP: 112/74 103/62  Pulse: 81 84  Resp: 18 18   Temp: 98.4 F (36.9 C) 98 F (36.7 C)  SpO2: 97%    Temp:  [98 F (36.7 C)-98.6 F (37 C)] 98 F (36.7 C) (07/08 1130) Pulse Rate:  [76-93] 84 (07/08 1130) Resp:  [18] 18 (07/08 1130) BP: (103-118)/(62-84) 103/62 (07/08 1130) SpO2:  [97 %-100 %] 97 % (07/08 0800) Weight:  [70.9 kg] 70.9 kg (07/08 0342) Temp (24hrs), Avg:98.3 F (36.8 C), Min:98 F (36.7 C), Max:98.6 F (37 C)   Intake/Output Summary (Last 24 hours) at 11/03/2019 1227 Last data filed at 11/03/2019 1208 Gross per 24 hour  Intake 1578.75 ml  Output 2950 ml  Net -1371.25 ml     Current Vital Signs 24h Vital Sign Ranges  T 98 F (36.7 C) Temp  Avg: 98.3 F (36.8 C)  Min: 98 F (36.7 C)  Max: 98.6 F (37 C)  BP 103/62 BP  Min: 103/62  Max: 118/84  HR 84 Pulse  Avg: 84.7  Min: 76  Max: 93  RR 18 Resp  Avg: 18  Min: 18  Max: 18  SaO2 97 % Room Air SpO2  Avg: 99 %  Min: 97 %  Max:  100 %       24 Hour I/O Current Shift I/O  Time Ins Outs 07/07 0701 - 07/08 0700 In: 1818.8 [P.O.:240; I.V.:1478.8] Out: 3650 [Urine:3650] 07/08 0701 - 07/08 1900 In: 100  Out: -    Patient Vitals for the past 24 hrs:  BP Temp Temp src Pulse Resp SpO2 Weight  11/03/19 1130 103/62 98 F (36.7 C) Oral 84 18 -- --  11/03/19 0800 112/74 98.4 F (36.9 C) Oral 81 18 97 % --  11/03/19 0342 -- -- -- -- -- -- 70.9 kg  11/03/19 0340 118/84 98.2 F (36.8 C) Oral 84 18 99 % --  11/02/19 2337 112/82 98.2 F (36.8 C) Oral 76 18 100 % --  11/02/19 1955 114/73 98.6 F (37 C) Oral 90 18 100 % --  11/02/19 1545 112/75 98.1 F (36.7 C) Oral 93 18 -- --    Physical exam: General: Well nourished, well developed female in no acute distress. Abdomen: + bowel sounds, non tender Cardiovascular: S1, S2 normal, no murmur, rub or gallop, regular rate and rhythm Respiratory: CTAB Extremities: no clubbing, cyanosis or edema Skin: Warm and dry.   Medications: Current Facility-Administered Medications  Medication Dose Route Frequency  Provider Last Rate Last Admin  . 0.9 %  sodium chloride infusion   Intravenous PRN Adelene Idler R, MD 10 mL/hr at 11/02/19 0835 250 mL at 11/02/19 0835  . ceFAZolin (ANCEF) IVPB 1 g/50 mL premix  1 g Intravenous Q8H Schuman, Christanna R, MD   Stopped at 11/03/19 1125  . famotidine (PEPCID) tablet 20 mg  20 mg Oral Q12H Schuman, Christanna R, MD   20 mg at 11/03/19 3875   Or  . famotidine (PEPCID) IVPB 20 mg premix  20 mg Intravenous Q12H Natale Milch, MD   Stopped at 10/31/19 2303  . hydrOXYzine (ATARAX/VISTARIL) tablet 50 mg  50 mg Oral Q6H PRN Schuman, Christanna R, MD       Or  . hydrOXYzine (VISTARIL) injection 50 mg  50 mg Intramuscular Q6H PRN Schuman, Christanna R, MD      . insulin aspart (novoLOG) injection 0-9 Units  0-9 Units Subcutaneous QID Farrel Conners, CNM   2 Units at 11/03/19 1207  . insulin aspart (novoLOG) injection 2 Units  2 Units Subcutaneous TID WC Farrel Conners, CNM   2 Units at 11/03/19 1049  . lactated ringers 1,000 mL infusion   Intravenous Continuous Farrel Conners, CNM 50 mL/hr at 11/03/19 0342 Rate Verify at 11/03/19 0342  . meclizine (ANTIVERT) tablet 25 mg  25 mg Oral Q6H Schuman, Christanna R, MD      . menthol-cetylpyridinium (CEPACOL) lozenge 3 mg  1 lozenge Oral PRN Schuman, Christanna R, MD   3 mg at 11/01/19 1451  . ondansetron (ZOFRAN) 8 mg in sodium chloride 0.9 % 50 mL IVPB  8 mg Intravenous Q6H Schuman, Christanna R, MD       Or  . ondansetron (ZOFRAN-ODT) disintegrating tablet 4-8 mg  4-8 mg Oral Q6H Schuman, Christanna R, MD   4 mg at 11/03/19 0852   Or  . ondansetron (ZOFRAN) injection 4 mg  4 mg Intravenous Q6H Schuman, Christanna R, MD      . promethazine (PHENERGAN) tablet 12.5 mg  12.5 mg Oral Q6H PRN Farrel Conners, CNM      . sodium chloride 0.9 % 1,000 mL with thiamine 100 mg, folic acid 1 mg, multivitamins adult 10 mL, potassium chloride 20 mEq infusion   Intravenous Daily Sharen Hones,  Jill Side, CNM         Labs:  Recent Labs  Lab 10/28/19 1256 10/31/19 0040  WBC 7.8 7.9  HGB 14.1 14.4  HCT 39.4 40.3  PLT 366 365    Recent Labs  Lab 10/28/19 1256 10/28/19 1256 10/31/19 0040 11/01/19 1017 11/02/19 1357  NA 136   < > 137 136 137  K 3.9   < > 3.3* 3.2* 4.2  CL 102   < > 101 108 105  CO2 25   < > 22 20* 24  BUN 11  --  20  --  5*  CREATININE 0.49  --  0.51  --  0.48  CALCIUM 9.1  --  9.1  --  9.0  PROT 7.8  --  8.4*  --   --   BILITOT 1.3*  --  2.0*  --   --   ALKPHOS 60  --  68  --   --   ALT 41  --  46*  --   --   AST 22  --  34  --   --   GLUCOSE 142*  --  131*  --  123*   < > = values in this interval not displayed.     Radiology:  NA  Assessment & Plan:  IUP [id number] *Pregnancy: hyperemesis, UTI . Continue advancing diet. Case management ordered. Diabetic nutrition ordered for dietary education.  Follow up will be with the [facility] next week and plan for transfer of care to [facility]. Dr. Jean Rosenthal updated on patient status.   Mirna Mires, CNM  11/03/2019 12:27 PM

## 2019-11-03 NOTE — Progress Notes (Signed)
Inpatient Diabetes Program Recommendations  AACE/ADA: New Consensus Statement on Inpatient Glycemic Control   Target Ranges:  Prepandial:   less than 140 mg/dL      Peak postprandial:   less than 180 mg/dL (1-2 hours)      Critically ill patients:  140 - 180 mg/dL   Results for Madison Harrell, Madison Harrell (MRN [id number]) as of 11/03/2019 13:53  Ref. Range 11/03/2019 03:42 11/03/2019 11:43 11/03/2019 13:36  Glucose-Capillary Latest Ref Range: 70 - 99 mg/dL 94 329 (H) 518 (H)  Results for Madison Harrell, Madison Harrell (MRN [id number]) as of 11/03/2019 13:53  Ref. Range 11/02/2019 07:55 11/02/2019 13:03 11/02/2019 17:17 11/02/2019 20:06 11/02/2019 23:44  Glucose-Capillary Latest Ref Range: 70 - 99 mg/dL 160 (H) 109 (H) 323 (H) 139 (H) 120 (H)   Review of Glycemic Control  Diabetes history:DM2 Outpatient Diabetes medications:Toujeo 10 units BID, Novolog 5 units TID with meals, Trulicity 0.75 mg Qweek (has been on for about 2 months), Metformin (stopped 2 weeks prior to admission) Current orders for Inpatient glycemic control: Novolog 0-9 units QID, Novolog 2 units TID with meals for meal coverage  Inpatient Diabetes Program Recommendations:    Insulin-Please consider increasing meal coverage to Novolog 4 units TID with meals if patient is able to eat at least 50% of meals.  Insulin-Correction: Please consider discontinuing Novolog 0-9 QID scale and use Diabetes Treatment for Pregnant/Postpartum Patients order set to order Novolog 0-14 units QID (fasting and 2 hour post prandial).  NOTE: Received page from Abby, RN regarding patient and glycemic control. Patient is now eating more solid food and tolerating today. Would recommend increasing meal coverage insulin slightly and changing Novolog correction to 0-14 units QID (fasting and 2 hour post prandial).  Thanks, Orlando Penner, RN, MSN, CDE Diabetes Coordinator Inpatient Diabetes Program [phone] (Team Pager from 8am to 5pm)

## 2019-11-03 NOTE — Progress Notes (Signed)
Initial Nutrition Assessment  DOCUMENTATION CODES:   Not applicable  INTERVENTION:  RD provided "Carbohydrate Counting for People with Diabetes" handout from the Academy of Nutrition and Dietetics. Discussed different food groups and their effects on blood sugar, emphasizing carbohydrate-containing foods. Provided list of carbohydrates and recommended serving sizes of common foods. Discussed importance of controlled and consistent carbohydrate intake throughout the day. Provided examples of ways to balance meals/snacks and encouraged intake of high-fiber, whole grain complex carbohydrates. Teach back method used. Expect good compliance.  RD provided "Morning Sickness Nutrition Therapy" handout from the Academy of Nutrition and Dietetics. Encouraged intake of small, frequent meals throughout the day. Discussed recommended foods that may be tolerated best. Discussed foods to limit that may not be tolerated in setting of hyperemesis.  NUTRITION DIAGNOSIS:   Inadequate oral intake related to decreased appetite, nausea, vomiting as evidenced by per patient/family report.  GOAL:   Patient will meet greater than or equal to 90% of their needs  MONITOR:   PO intake, Labs, Weight trends, I & O's  REASON FOR ASSESSMENT:   Consult Diet education  ASSESSMENT:   29 year old [id number] at 68w4dby LMP and ultrasound admitted for uncontrolled IDDM and hyperemesis.   Met with patient at bedside. She is known to this RD from an admission in 07/2018 where RD was also consulted for diet education. Patient reports her appetite is improving now and she has been able to tolerate more PO intake. She reports for approximately 4 months she has been struggling with hyperemesis. She can tolerate small amounts of bland food such as rice and beans, soup, and some fruit. Patient reports she understands carbohydrate-modified diet and carbohydrate counting. She just has not been able to take her medications lately as  she was not feeling good and did not want to develop hypoglycemia. Reviewed diet education with patient. Encouraged intake of small, frequent meals throughout the day in setting of hyperemesis. Discussed foods that will likely be tolerated best. Patient reports she cannot tolerate oral nutrition supplements.  Patient reports her pre-pregnancy weight was approximately 169 lbs. Patient was 76.3 kg on 05/02/2019. There is a gap in weights after that but then patient was 73 kg on 09/19/2019. She is now 70.9 kg (156.31 lbs). Per report she has lost 5.4 kg (7.1% body weight) over the past 3 months, which is not significant for time frame but is still concerning.  Medications reviewed and include: famotidine, Novolog 0-9 units Q4hrs, Novolog 2 units TID, meclizine, Zofran, cefazolin, famotidine, 1 L NS with thiamine 19449mg, folic acid 1 mg, MVI, and potassium chloride 20 mEq daily.  Labs reviewed: CBG 94-173.  Discussed with RN.  NUTRITION - FOCUSED PHYSICAL EXAM:    Most Recent Value  Orbital Region No depletion  Upper Arm Region No depletion  Thoracic and Lumbar Region Unable to assess  Buccal Region No depletion  Temple Region No depletion  Clavicle Bone Region No depletion  Clavicle and Acromion Bone Region No depletion  Scapular Bone Region Unable to assess  Dorsal Hand No depletion  Patellar Region Unable to assess  Anterior Thigh Region Unable to assess  Posterior Calf Region Unable to assess  Edema (RD Assessment) Unable to assess  Hair Reviewed  Eyes Reviewed  Mouth Unable to assess  Skin Reviewed  Nails Unable to assess     Diet Order:   Diet Order            Diet Carb Modified Fluid consistency: Thin; Room  service appropriate? Yes  Diet effective now                EDUCATION NEEDS:   Education needs have been addressed  Skin:  Skin Assessment: Reviewed RN Assessment  Last BM:  11/02/2019  Height:   Ht Readings from Last 1 Encounters:  10/31/19 5' (1.524 m)    Weight:   Wt Readings from Last 1 Encounters:  11/03/19 70.9 kg   BMI:  Body mass index is 30.53 kg/m.  Estimated Nutritional Needs:   Kcal:  1650-1850  Protein:  78-85 grams  Fluid:  >/= 2.1 L/day  Jacklynn Barnacle, MS, RD, LDN Pager number available on Amion

## 2019-11-04 DIAGNOSIS — O0991 Supervision of high risk pregnancy, unspecified, first trimester: Secondary | ICD-10-CM

## 2019-11-04 DIAGNOSIS — E1069 Type 1 diabetes mellitus with other specified complication: Secondary | ICD-10-CM

## 2019-11-04 LAB — GLUCOSE, CAPILLARY
Glucose-Capillary: 97 mg/dL (ref 70–99)
Glucose-Capillary: 163 mg/dL — ABNORMAL HIGH (ref 70–99)

## 2019-11-04 MED ORDER — ONDANSETRON 4 MG PO TBDP
4.0000 mg | ORAL_TABLET | Freq: Four times a day (QID) | ORAL | Status: DC
  Administered 2019-11-04: 8 mg via ORAL
  Filled 2019-11-04: qty 2

## 2019-11-04 MED ORDER — SODIUM CHLORIDE 0.9 % IV SOLN
8.0000 mg | Freq: Four times a day (QID) | INTRAVENOUS | Status: DC
  Filled 2019-11-04 (×3): qty 4

## 2019-11-04 MED ORDER — ONDANSETRON HCL 4 MG/2ML IJ SOLN: 4.0000 mg | Freq: Four times a day (QID) | INTRAMUSCULAR | Status: DC

## 2019-11-04 MED ORDER — BLOOD GLUCOSE MONITOR KIT: PACK | 0 refills | Status: DC

## 2019-11-04 MED ORDER — FAMOTIDINE 20 MG PO TABS: 20.0000 mg | ORAL_TABLET | Freq: Two times a day (BID) | ORAL | 1 refills | Status: DC

## 2019-11-04 MED ORDER — INSULIN ASPART 100 UNIT/ML ~~LOC~~ SOLN: 6.0000 [IU] | Freq: Three times a day (TID) | SUBCUTANEOUS | 11 refills | Status: DC

## 2019-11-04 MED ORDER — PROMETHAZINE HCL 12.5 MG PO TABS: 12.5000 mg | ORAL_TABLET | Freq: Four times a day (QID) | ORAL | 0 refills | Status: DC | PRN

## 2019-11-04 MED ORDER — INSULIN NPH (HUMAN) (ISOPHANE) 100 UNIT/ML ~~LOC~~ SUSP
SUBCUTANEOUS | 3 refills | Status: DC
Start: 2019-11-04 — End: 2020-10-22

## 2019-11-04 NOTE — Discharge Instructions (Signed)
Hyperemesis Gravidarum Hyperemesis gravidarum is a severe form of nausea and vomiting that happens during pregnancy. Hyperemesis is worse than morning sickness. It may cause you to have nausea or vomiting all day for many days. It may keep you from eating and drinking enough food and liquids, which can lead to dehydration, malnutrition, and weight loss. Hyperemesis usually occurs during the first half (the first 20 weeks) of pregnancy. It often goes away once a woman is in her second half of pregnancy. However, sometimes hyperemesis continues through an entire pregnancy. What are the causes? The cause of this condition is not known. It may be related to changes in chemicals (hormones) in the body during pregnancy, such as the high level of pregnancy hormone (human chorionic gonadotropin) or the increase in the female sex hormone (estrogen). What are the signs or symptoms? Symptoms of this condition include:  Nausea that does not go away.  Vomiting that does not allow you to keep any food down.  Weight loss.  Body fluid loss (dehydration).  Having no desire to eat, or not liking food that you have previously enjoyed. How is this diagnosed? This condition may be diagnosed based on:  A physical exam.  Your medical history.  Your symptoms.  Blood tests.  Urine tests. How is this treated? This condition is managed by controlling symptoms. This may include:  Following an eating plan. This can help lessen nausea and vomiting.  Taking prescription medicines. An eating plan and medicines are often used together to help control symptoms. If medicines do not help relieve nausea and vomiting, you may need to receive fluids through an IV at the hospital. Follow these instructions at home: Eating and drinking   Avoid the following: ? Drinking fluids with meals. Try not to drink anything during the 30 minutes before and after your meals. ? Drinking more than 1 cup of fluid at a  time. ? Eating foods that trigger your symptoms. These may include spicy foods, coffee, high-fat foods, very sweet foods, and acidic foods. ? Skipping meals. Nausea can be more intense on an empty stomach. If you cannot tolerate food, do not force it. Try sucking on ice chips or other frozen items and make up for missed calories later. ? Lying down within 2 hours after eating. ? Being exposed to environmental triggers. These may include food smells, smoky rooms, closed spaces, rooms with strong smells, warm or humid places, overly loud and noisy rooms, and rooms with motion or flickering lights. Try eating meals in a well-ventilated area that is free of strong smells. ? Quick and sudden changes in your movement. ? Taking iron pills and multivitamins that contain iron. If you take prescription iron pills, do not stop taking them unless your health care provider approves. ? Preparing food. The smell of food can spoil your appetite or trigger nausea.  To help relieve your symptoms: ? Listen to your body. Everyone is different and has different preferences. Find what works best for you. ? Eat and drink slowly. ? Eat 5-6 small meals daily instead of 3 large meals. Eating small meals and snacks can help you avoid an empty stomach. ? In the morning, before getting out of bed, eat a couple of crackers to avoid moving around on an empty stomach. ? Try eating starchy foods as these are usually tolerated well. Examples include cereal, toast, bread, potatoes, pasta, rice, and pretzels. ? Include at least 1 serving of protein with your meals and snacks. Protein options include   lean meats, poultry, seafood, beans, nuts, nut butters, eggs, cheese, and yogurt. ? Try eating a protein-rich snack before bed. Examples of a protein-rick snack include cheese and crackers or a peanut butter sandwich made with 1 slice of whole-wheat bread and 1 tsp (5 g) of peanut butter. ? Eat or suck on things that have ginger in them.  It may help relieve nausea. Add  tsp ground ginger to hot tea or choose ginger tea. ? Try drinking 100% fruit juice or an electrolyte drink. An electrolyte drink contains sodium, potassium, and chloride. ? Drink fluids that are cold, clear, and carbonated or sour. Examples include lemonade, ginger ale, lemon-lime soda, ice water, and sparkling water. ? Brush your teeth or use a mouth rinse after meals. ? Talk with your health care provider about starting a supplement of vitamin B6. General instructions  Take over-the-counter and prescription medicines only as told by your health care provider.  Follow instructions from your health care provider about eating or drinking restrictions.  Continue to take your prenatal vitamins as told by your health care provider. If you are having trouble taking your prenatal vitamins, talk with your health care provider about different options.  Keep all follow-up and pre-birth (prenatal) visits as told by your health care provider. This is important. Contact a health care provider if:  You have pain in your abdomen.  You have a severe headache.  You have vision problems.  You are losing weight.  You feel weak or dizzy. Get help right away if:  You cannot drink fluids without vomiting.  You vomit blood.  You have constant nausea and vomiting.  You are very weak.  You faint.  You have a fever and your symptoms suddenly get worse. Summary  Hyperemesis gravidarum is a severe form of nausea and vomiting that happens during pregnancy.  Making some changes to your eating habits may help relieve nausea and vomiting.  This condition may be managed with medicine.  If medicines do not help relieve nausea and vomiting, you may need to receive fluids through an IV at the hospital. This information is not intended to replace advice given to you by your health care provider. Make sure you discuss any questions you have with your health care  provider. Document Revised: 05/04/2017 Document Reviewed: 12/12/2015 Elsevier Patient Education  2020 Elsevier Inc.  

## 2019-11-04 NOTE — Discharge Summary (Signed)
Physician Final Progress Note  Patient ID: Madison Harrell MRN: [id number] DOB/AGE: Oct 13, 1990 29 y.o.  Admit date: 10/31/2019 Admitting provider: Natale Milch, MD Discharge date: 11/04/2019   Admission Diagnoses: hyperemesis   Discharge Diagnoses:  Active Problems:   Supervision of high risk pregnancy in first trimester   Type 1 diabetes mellitus with other specified complication (HCC)   Hyperemesis affecting pregnancy, antepartum   History of Present Illness: The patient is a 29 y.o. female [phone] at [redacted]w[redacted]d who was admitted on 7/5 due to hyperemesis. She is an insulin dependent diabetic and has been unable to control her blood sugar well in the past week. Patient has been on IV fluids, anti-emetics, IV potassium, insulin, was seen by diabetes coordinator, advance diet while in hospital. She was also treated for previously diagnosed UTI while in hospital.  The patient has made good progress while in patient regarding her hyperemesis and hypokalemia. She is tolerating regular diet and anti nausea medications by mouth. Insulin dosing is adjusted at discharge per diabetes coordinator recommendations. The patient will have a follow up visit with Dr Jean Rosenthal and there is a plan for her to transfer her OB care to [facility].    Pregnancy complicated by:       pregnancy 7  Problems (from 08/24/19 to present)    Problem Noted Resolved   High-risk pregnancy, first trimester 09/28/2019 by Nadara Mustard, MD No   Overview Addendum 10/31/2019  2:26 PM by Conard Novak, MD    - h/o preterm delivery x 2 for severe preeclampsia - h/o diabetic nephropathy/retinopathy per [facility] records - h/o tetrology of Fallot with last preg - h/o c-section with successful VBAC last pregnancy at 33w - h/o hyperemesis  - h/o severe depression/anxiety - h/o chronic htn - XFER patient when able      Nursing Staff Provider  Office Location  [facility] Dating   LMP = 6 wk [place]  Language  English Anatomy US    [  ] fetal echo  Flu Vaccine   Genetic Screen  NIPS:   AFP:   First Screen:    TDaP vaccine    Hgb A1C or  GTT Early : hgba1c:10.2 Third trimester :   Rhogam   not needed   LAB RESULTS   Feeding Plan  Blood Type O/Positive/-- (06/02 1533)   Contraception  Antibody Negative (06/02 1533)  Circumcision  Rubella 1.95 (06/02 1533)  Pediatrician   RPR Non Reactive (06/02 1533)   Support Person  HBsAg Negative (06/02 1533)   Prenatal Classes  HIV Non Reactive (06/02 1533)    Varicella  Immune  BTL Consent  GBS  (For PCN allergy, check sensitivities)        VBAC Consent  Pap  ASCUS HPV+, colpo- CIN 1    Hgb Electro      CF      SMA               Past Medical History:  Diagnosis Date  . Diabetes mellitus without complication (HCC)   . pre E     Past Surgical History:  Procedure Laterality Date  . CESAREAN SECTION  2013    No current facility-administered medications on file prior to encounter.   Current Outpatient Medications on File Prior to Encounter  Medication Sig Dispense Refill  . cephALEXin (KEFLEX) 250 MG capsule Take 1 capsule (250 mg total) by mouth 4 (four) times daily for 7 days. 28 capsule 0  . Doxylamine-Pyridoxine (DICLEGIS) 10-10  MG TBEC Take 2 tablets by mouth at bedtime. If symptoms persist, add one tablet in the morning and one in the afternoon 100 tablet 5  . insulin glargine, 2 Unit Dial, (TOUJEO MAX SOLOSTAR) 300 UNIT/ML Solostar Pen Inject 20-50 Units into the skin daily.     . insulin regular (NOVOLIN R) 100 units/mL injection Inject 5-7 Units into the skin 3 (three) times daily before meals.    Marland Kitchen lithium 300 MG tablet Take 600 mg by mouth daily.    . metFORMIN (GLUCOPHAGE) 500 MG tablet Take 1,000 mg by mouth 2 (two) times daily with a meal.     . ondansetron (ZOFRAN ODT) 4 MG disintegrating tablet Take 1 tablet (4 mg total) by mouth every 8 (eight) hours as needed for nausea or vomiting. 20 tablet 0  . TRULICITY 0.75 MG/0.5ML  SOPN Inject 0.75 mg into the skin every Sunday.       No Known Allergies  Social History   Socioeconomic History  . Marital status: Married    Spouse name: Not on file  . Number of children: 2  . Years of education: Not on file  . Highest education level: Not on file  Occupational History  . Not on file  Tobacco Use  . Smoking status: Former Smoker    Types: Cigarettes  . Smokeless tobacco: Never Used  Vaping Use  . Vaping Use: Never used  Substance and Sexual Activity  . Alcohol use: No  . Drug use: No  . Sexual activity: Yes    Birth control/protection: None  Other Topics Concern  . Not on file  Social History Narrative  . Not on file   Social Determinants of Health   Financial Resource Strain:   . Difficulty of Paying Living Expenses:   Food Insecurity:   . Worried About Programme researcher, broadcasting/film/video in the Last Year:   . Barista in the Last Year:   Transportation Needs:   . Freight forwarder (Medical):   Marland Kitchen Lack of Transportation (Non-Medical):   Physical Activity:   . Days of Exercise per Week:   . Minutes of Exercise per Session:   Stress:   . Feeling of Stress :   Social Connections:   . Frequency of Communication with Friends and Family:   . Frequency of Social Gatherings with Friends and Family:   . Attends Religious Services:   . Active Member of Clubs or Organizations:   . Attends Banker Meetings:   Marland Kitchen Marital Status:   Intimate Partner Violence:   . Fear of Current or Ex-Partner:   . Emotionally Abused:   Marland Kitchen Physically Abused:   . Sexually Abused:     Family History  Problem Relation Age of Onset  . Diabetes Father      Review of Systems  Constitutional: Negative for chills and fever.  HENT: Negative for congestion, ear discharge, ear pain, hearing loss, sinus pain and sore throat.   Eyes: Negative for blurred vision and double vision.  Respiratory: Negative for cough, shortness of breath and wheezing.   Cardiovascular:  Negative for chest pain, palpitations and leg swelling.  Gastrointestinal: Negative for abdominal pain, blood in stool, constipation, diarrhea, heartburn, melena, nausea and vomiting.  Genitourinary: Negative for dysuria, flank pain, frequency, hematuria and urgency.  Musculoskeletal: Negative for back pain, joint pain and myalgias.  Skin: Negative for itching and rash.  Neurological: Negative for dizziness, tingling, tremors, sensory change, speech change, focal weakness, seizures, loss  of consciousness, weakness and headaches.  Endo/Heme/Allergies: Negative for environmental allergies. Does not bruise/bleed easily.  Psychiatric/Behavioral: Negative for depression, hallucinations, memory loss, substance abuse and suicidal ideas. The patient is not nervous/anxious and does not have insomnia.      Physical Exam: BP 93/61 (BP Location: Left Arm)   Pulse 83   Temp 98.3 F (36.8 C) (Oral)   Resp 20   Ht 5' (1.524 m)   Wt 70.5 kg   LMP 08/14/2019   SpO2 96%   BMI 30.35 kg/m   Constitutional: Well nourished, well developed female in no acute distress.  HEENT: normal Skin: Warm and dry.  Cardiovascular: Regular rate and rhythm.   Extremity: no edema  Respiratory: Clear to auscultation bilateral. Normal respiratory effort Abdomen: FHT present, 155 by doppler Back: no CVAT Neuro: DTRs 2+, Cranial nerves grossly intact Psych: Alert and Oriented x3. No memory deficits. Normal mood and affect.    Consults: diabetes coordinator  Significant Findings/ Diagnostic Studies: labs:  Results for Madison Harrell, Madison Harrell (MRN [id number]) as of 11/04/2019 11:04  Ref. Range 11/04/2019 07:52 11/04/2019 10:03  Glucose-Capillary Latest Ref Range: 70 - 99 mg/dL 97 [id number] (H)   Procedures: none  Hospital Course: The patient was admitted to Antepartum care  Discharge Condition: good  Disposition: Discharge disposition: 01-Home or Self Care  Diet: Diabetic diet  Discharge Activity: Activity as  tolerated  Discharge Instructions    Discharge activity:  No Restrictions   Complete by: As directed    Discharge diet:   Complete by: As directed    Healthy diabetic diet   No sexual activity restrictions   Complete by: As directed      Allergies as of 11/04/2019   No Known Allergies     Medication List    STOP taking these medications   cephALEXin 250 MG capsule Commonly known as: KEFLEX   insulin regular 100 units/mL injection Commonly known as: NOVOLIN R   lithium 300 MG tablet   Toujeo Max SoloStar 300 UNIT/ML Solostar Pen Generic drug: insulin glargine (2 Unit Dial)   Trulicity 0.75 MG/0.5ML Sopn Generic drug: Dulaglutide     TAKE these medications   Doxylamine-Pyridoxine 10-10 MG Tbec Commonly known as: Diclegis Take 2 tablets by mouth at bedtime. If symptoms persist, add one tablet in the morning and one in the afternoon   famotidine 20 MG tablet Commonly known as: PEPCID Take 1 tablet (20 mg total) by mouth every 12 (twelve) hours. Notes to patient: Already taken at 10:14am, will be due again tonight at 10:14pm!    insulin aspart 100 UNIT/ML injection Commonly known as: novoLOG Inject 6 Units into the skin 3 (three) times daily with meals.   insulin NPH Human 100 UNIT/ML injection Commonly known as: NovoLIN N Inject 5 units every morning before meal   metFORMIN 500 MG tablet Commonly known as: GLUCOPHAGE Take 1,000 mg by mouth 2 (two) times daily with a meal.   ondansetron 4 MG disintegrating tablet Commonly known as: Zofran ODT Take 1 tablet (4 mg total) by mouth every 8 (eight) hours as needed for nausea or vomiting. Notes to patient: Taken at 12:00pm, will be due again at 8:00pm if needed!    promethazine 12.5 MG tablet Commonly known as: PHENERGAN Take 1 tablet (12.5 mg total) by mouth every 6 (six) hours as needed for nausea or vomiting. Notes to patient: Taken at 8:06am, will be due again if needed at 2:06pm!        Follow-up  Information  [facility]. Go to.   Specialty: Obstetrics and Gynecology Why: scheduled prenatal appointment Contact information: [address] [name] [address] [phone]  Follow up with endocrinologist Dr Gershon Crane  Transfer prenatal care to [facility]             Total time spent taking care of this patient: 40 minutes  Signed: Tresea Mall, CNM  11/04/2019, 11:09 AM

## 2019-11-04 NOTE — Progress Notes (Signed)
Patient discharged home. Discharge instructions and prescriptions given and reviewed with patient. Patient verbalized understanding.   Went over diabetes care and insulin changes in detail. Encouraged pt to call today (ASAP) to schedule appointment with Dr. Gershon Crane (endocrinologist).  Scheduled next prenatal visit with Dr. Jean Rosenthal.  Escorted out by staff.

## 2019-11-04 NOTE — Progress Notes (Signed)
Inpatient Diabetes Program Recommendations  Diabetes Treatment Program Recommendations  ADA Standards of Care 2021 Diabetes in Pregnancy Target Glucose Ranges:  Fasting: 60 - 90 mg/dL Preprandial: 60 - 962 mg/dL 1 hr postprandial: Less than 140mg /dL (from first bite of meal) 2 hr postprandial: Less than 120 mg/dL (from first bite of meal)     Results for [name], [name] (MRN Hedda Slade) as of 11/04/2019 07:49  Ref. Range 11/03/2019 03:42 11/03/2019 10:49 11/03/2019 11:43 11/03/2019 12:07 11/03/2019 13:36 11/03/2019 14:10 11/03/2019 15:39 11/03/2019 15:59 11/03/2019 18:35 11/03/2019 20:25  Glucose-Capillary Latest Ref Range: 70 - 99 mg/dL 94   Novolog 2 units 01/04/2020 (H)   Novolog 2 units 119 (H)   Novolog 2 units 149 (H)   Novolog 2 units   Novolog 4 units 166 (H)  Novolog 3 units   Review of Glycemic Control  Diabetes history:DM2 Outpatient Diabetes medications:Toujeo 10 units BID, Novolog 5 units TID with meals, Trulicity 0.75 mg Qweek (has been on for about 2 months), Metformin (stopped 2 weeks prior to admission) Current orders for Inpatient glycemic control:Novolog 0-14 units TID (2 hour post prandial), Novolog 4 units TID with meals for meal coverage  Inpatient Diabetes Program Recommendations:    Insulin-Please consider ordering NPH 5 units QAM, increasing meal coverage to Novolog 6 units TID with meals if patient eats at least 50% of meals, and change frequency of correction to QID (fasting and 2 hour post prandial).  Thanks, 194, RN, MSN, CDE Diabetes Coordinator Inpatient Diabetes Program [phone] (Team Pager from 8am to 5pm)

## 2019-11-07 ENCOUNTER — Telehealth: Payer: Self-pay

## 2019-11-07 NOTE — Telephone Encounter (Signed)
Pt's pharmacy called after hour nurse stating ins is requesting novalog to be switched to humalog and novolin to humulin; ok'd by Dr. Tiburcio Pea.  LMTC to confirm she (pt) was able to get her insulin.

## 2019-11-07 NOTE — Telephone Encounter (Signed)
Left second msg

## 2019-11-09 NOTE — Telephone Encounter (Signed)
Pt hasn't returned call.  Has appt c [facility] 11/11/19.

## 2019-11-11 ENCOUNTER — Other Ambulatory Visit: Payer: Self-pay

## 2019-11-11 ENCOUNTER — Encounter: Payer: Self-pay | Admitting: Obstetrics and Gynecology

## 2019-11-11 ENCOUNTER — Ambulatory Visit (INDEPENDENT_AMBULATORY_CARE_PROVIDER_SITE_OTHER): Payer: 59 | Admitting: Obstetrics and Gynecology

## 2019-11-11 VITALS — BP 118/74 | Wt 155.0 lb

## 2019-11-11 DIAGNOSIS — O24011 Pre-existing diabetes mellitus, type 1, in pregnancy, first trimester: Secondary | ICD-10-CM

## 2019-11-11 DIAGNOSIS — O09299 Supervision of pregnancy with other poor reproductive or obstetric history, unspecified trimester: Secondary | ICD-10-CM | POA: Insufficient documentation

## 2019-11-11 DIAGNOSIS — Z3A12 12 weeks gestation of pregnancy: Secondary | ICD-10-CM

## 2019-11-11 DIAGNOSIS — O210 Mild hyperemesis gravidarum: Secondary | ICD-10-CM

## 2019-11-11 DIAGNOSIS — O09899 Supervision of other high risk pregnancies, unspecified trimester: Secondary | ICD-10-CM

## 2019-11-11 DIAGNOSIS — O24919 Unspecified diabetes mellitus in pregnancy, unspecified trimester: Secondary | ICD-10-CM | POA: Insufficient documentation

## 2019-11-11 DIAGNOSIS — O0991 Supervision of high risk pregnancy, unspecified, first trimester: Secondary | ICD-10-CM

## 2019-11-11 DIAGNOSIS — O10919 Unspecified pre-existing hypertension complicating pregnancy, unspecified trimester: Secondary | ICD-10-CM

## 2019-11-11 DIAGNOSIS — E669 Obesity, unspecified: Secondary | ICD-10-CM

## 2019-11-11 DIAGNOSIS — Z98891 History of uterine scar from previous surgery: Secondary | ICD-10-CM

## 2019-11-11 MED ORDER — BLOOD GLUCOSE MONITOR KIT: PACK | 0 refills | Status: DC

## 2019-11-11 NOTE — Telephone Encounter (Signed)
Pt has never had a monitor.
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
## 2019-11-11 NOTE — Telephone Encounter (Signed)
Will you call her pharmacy and order her a glucometer with tests strips (100 ct), and lancets (100 ct)? Thanks!

## 2019-11-11 NOTE — Telephone Encounter (Signed)
Called pt's pharmacy, gave verbal order as rx wouldn't go thru.  Pt aware.

## 2019-11-11 NOTE — Telephone Encounter (Signed)
She had one sent in while she was in the hospital.  Did that one not work?

## 2019-11-11 NOTE — Telephone Encounter (Signed)
What is the request?

## 2019-11-22 ENCOUNTER — Encounter: Payer: Self-pay | Admitting: Obstetrics and Gynecology

## 2019-11-22 NOTE — Telephone Encounter (Signed)
This encounter was created in error - please disregard.

## 2019-12-27 ENCOUNTER — Other Ambulatory Visit: Payer: Self-pay | Admitting: Advanced Practice Midwife

## 2019-12-28 NOTE — Telephone Encounter (Signed)
Advise

## 2020-01-23 ENCOUNTER — Other Ambulatory Visit: Payer: Self-pay | Admitting: Advanced Practice Midwife

## 2020-01-23 NOTE — Telephone Encounter (Signed)
Advise

## 2020-04-08 DIAGNOSIS — O1490 Unspecified pre-eclampsia, unspecified trimester: Secondary | ICD-10-CM

## 2020-07-11 ENCOUNTER — Telehealth: Payer: Self-pay

## 2020-07-11 NOTE — Telephone Encounter (Signed)
Patient is scheduled for 07/13/20 at 10:10 with [facility]

## 2020-07-13 ENCOUNTER — Ambulatory Visit: Payer: 59 | Admitting: Obstetrics

## 2020-07-16 NOTE — Telephone Encounter (Signed)
Noted. Patient no showed appointment.

## 2020-10-17 ENCOUNTER — Encounter: Payer: Self-pay | Admitting: Emergency Medicine

## 2020-10-17 ENCOUNTER — Emergency Department
Admission: EM | Admit: 2020-10-17 | Discharge: 2020-10-17 | Disposition: A | Discharge status: Other Acute Inpatient Hospital | Payer: 59 | Attending: Emergency Medicine | Admitting: Emergency Medicine

## 2020-10-17 ENCOUNTER — Other Ambulatory Visit: Payer: Self-pay

## 2020-10-17 ENCOUNTER — Encounter (HOSPITAL_COMMUNITY): Payer: Self-pay | Admitting: Psychiatry

## 2020-10-17 ENCOUNTER — Inpatient Hospital Stay (HOSPITAL_COMMUNITY)
Admission: AD | Admit: 2020-10-17 | Discharge: 2020-10-22 | DRG: 885 | Disposition: A | Discharge status: Home / Self Care | Payer: 59 | Source: Intra-hospital | Attending: Psychiatry | Admitting: Psychiatry

## 2020-10-17 DIAGNOSIS — R45851 Suicidal ideations: Secondary | ICD-10-CM | POA: Diagnosis present

## 2020-10-17 DIAGNOSIS — Z794 Long term (current) use of insulin: Secondary | ICD-10-CM | POA: Diagnosis not present

## 2020-10-17 DIAGNOSIS — Z046 Encounter for general psychiatric examination, requested by authority: Secondary | ICD-10-CM | POA: Diagnosis present

## 2020-10-17 DIAGNOSIS — F410 Panic disorder [episodic paroxysmal anxiety] without agoraphobia: Secondary | ICD-10-CM | POA: Diagnosis present

## 2020-10-17 DIAGNOSIS — Z87891 Personal history of nicotine dependence: Secondary | ICD-10-CM

## 2020-10-17 DIAGNOSIS — E119 Type 2 diabetes mellitus without complications: Secondary | ICD-10-CM

## 2020-10-17 DIAGNOSIS — F332 Major depressive disorder, recurrent severe without psychotic features: Secondary | ICD-10-CM | POA: Insufficient documentation

## 2020-10-17 DIAGNOSIS — E1010 Type 1 diabetes mellitus with ketoacidosis without coma: Secondary | ICD-10-CM | POA: Insufficient documentation

## 2020-10-17 DIAGNOSIS — Z8616 Personal history of COVID-19: Secondary | ICD-10-CM | POA: Diagnosis not present

## 2020-10-17 DIAGNOSIS — Z20822 Contact with and (suspected) exposure to covid-19: Secondary | ICD-10-CM | POA: Diagnosis not present

## 2020-10-17 DIAGNOSIS — Z7984 Long term (current) use of oral hypoglycemic drugs: Secondary | ICD-10-CM | POA: Insufficient documentation

## 2020-10-17 DIAGNOSIS — F331 Major depressive disorder, recurrent, moderate: Secondary | ICD-10-CM

## 2020-10-17 LAB — BASIC METABOLIC PANEL
Anion gap: 9 (ref 5–15)
BUN: 14 mg/dL (ref 6–20)
CO2: 22 mmol/L (ref 22–32)
Calcium: 8.9 mg/dL (ref 8.9–10.3)
Chloride: 105 mmol/L (ref 98–111)
Creatinine, Ser: 0.45 mg/dL (ref 0.44–1.00)
GFR, Estimated: >60 mL/min (ref >60)
Glucose, Bld: 405 mg/dL — ABNORMAL HIGH (ref 70–99)
Potassium: 3.9 mmol/L (ref 3.5–5.1)
Sodium: 136 mmol/L (ref 135–145)

## 2020-10-17 LAB — URINE DRUG SCREEN, QUALITATIVE (ARMC ONLY)
Amphetamines, Ur Screen: NOT DETECTED
Barbiturates, Ur Screen: NOT DETECTED
Benzodiazepine, Ur Scrn: NOT DETECTED
Cannabinoid 50 Ng, Ur ~~LOC~~: NOT DETECTED
Cocaine Metabolite,Ur ~~LOC~~: NOT DETECTED
MDMA (Ecstasy)Ur Screen: NOT DETECTED
Methadone Scn, Ur: NOT DETECTED
Opiate, Ur Screen: NOT DETECTED
Phencyclidine (PCP) Ur S: NOT DETECTED
Tricyclic, Ur Screen: NOT DETECTED

## 2020-10-17 LAB — CBC
HCT: 39.1 % (ref 36.0–46.0)
Hemoglobin: 12.6 g/dL (ref 12.0–15.0)
MCH: 23 pg — ABNORMAL LOW (ref 26.0–34.0)
MCHC: 32.2 g/dL (ref 30.0–36.0)
MCV: 71.4 fL — ABNORMAL LOW (ref 80.0–100.0)
Platelets: 439 10*3/uL — ABNORMAL HIGH (ref 150–400)
RBC: 5.48 MIL/uL — ABNORMAL HIGH (ref 3.87–5.11)
RDW: 17.5 % — ABNORMAL HIGH (ref 11.5–15.5)
WBC: 8.8 10*3/uL (ref 4.0–10.5)
nRBC: 0 % (ref 0.0–0.2)

## 2020-10-17 LAB — CBG MONITORING, ED
Glucose-Capillary: 381 mg/dL — ABNORMAL HIGH (ref 70–99)
Glucose-Capillary: 234 mg/dL — ABNORMAL HIGH (ref 70–99)
Glucose-Capillary: 210 mg/dL — ABNORMAL HIGH (ref 70–99)

## 2020-10-17 LAB — GLUCOSE, CAPILLARY
Glucose-Capillary: 239 mg/dL — ABNORMAL HIGH (ref 70–99)
Glucose-Capillary: 288 mg/dL — ABNORMAL HIGH (ref 70–99)

## 2020-10-17 LAB — SALICYLATE LEVEL: Salicylate Lvl: <7 mg/dL — ABNORMAL LOW (ref 7.0–30.0)

## 2020-10-17 LAB — RESP PANEL BY RT-PCR (FLU A&B, COVID) ARPGX2
Influenza A by PCR: NEGATIVE
Influenza B by PCR: NEGATIVE
SARS Coronavirus 2 by RT PCR: NEGATIVE

## 2020-10-17 LAB — PREGNANCY, URINE: Preg Test, Ur: NEGATIVE

## 2020-10-17 LAB — ACETAMINOPHEN LEVEL: Acetaminophen (Tylenol), Serum: <10 ug/mL — ABNORMAL LOW (ref 10–30)

## 2020-10-17 LAB — ETHANOL: Alcohol, Ethyl (B): <10 mg/dL (ref <10)

## 2020-10-17 MED ORDER — INSULIN ASPART 100 UNIT/ML IJ SOLN
0.0000 [IU] | Freq: Three times a day (TID) | INTRAMUSCULAR | Status: DC
  Administered 2020-10-17: 5 [IU] via SUBCUTANEOUS
  Administered 2020-10-18: 11 [IU] via SUBCUTANEOUS
  Administered 2020-10-18 (×2): 8 [IU] via SUBCUTANEOUS
  Administered 2020-10-19: 5 [IU] via SUBCUTANEOUS
  Administered 2020-10-19: 11 [IU] via SUBCUTANEOUS
  Administered 2020-10-19: 8 [IU] via SUBCUTANEOUS
  Administered 2020-10-20 (×2): 5 [IU] via SUBCUTANEOUS
  Administered 2020-10-20: 11 [IU] via SUBCUTANEOUS
  Administered 2020-10-21: 5 [IU] via SUBCUTANEOUS
  Administered 2020-10-21 (×2): 8 [IU] via SUBCUTANEOUS
  Administered 2020-10-22: 11 [IU] via SUBCUTANEOUS
  Administered 2020-10-22: 5 [IU] via SUBCUTANEOUS

## 2020-10-17 MED ORDER — INSULIN ASPART 100 UNIT/ML IJ SOLN: 6.0000 [IU] | Freq: Three times a day (TID) | INTRAMUSCULAR | Status: DC

## 2020-10-17 MED ORDER — ALUM & MAG HYDROXIDE-SIMETH 200-200-20 MG/5ML PO SUSP: 30.0000 mL | ORAL | Status: DC | PRN

## 2020-10-17 MED ORDER — HYDROXYZINE HCL 50 MG PO TABS: 50.0000 mg | ORAL_TABLET | Freq: Three times a day (TID) | ORAL | Status: DC | PRN

## 2020-10-17 MED ORDER — METFORMIN HCL 500 MG PO TABS
1000.0000 mg | ORAL_TABLET | Freq: Two times a day (BID) | ORAL | Status: DC
  Administered 2020-10-17 – 2020-10-18 (×2): 1000 mg via ORAL
  Filled 2020-10-17 (×7): qty 2

## 2020-10-17 MED ORDER — MAGNESIUM HYDROXIDE 400 MG/5ML PO SUSP: 30.0000 mL | Freq: Every day | ORAL | Status: DC | PRN

## 2020-10-17 MED ORDER — INSULIN ASPART 100 UNIT/ML IJ SOLN: 0.0000 [IU] | Freq: Three times a day (TID) | INTRAMUSCULAR | Status: DC

## 2020-10-17 MED ORDER — INSULIN DETEMIR 100 UNIT/ML ~~LOC~~ SOLN
5.0000 [IU] | Freq: Every day | SUBCUTANEOUS | Status: DC
  Administered 2020-10-18: 5 [IU] via SUBCUTANEOUS

## 2020-10-17 MED ORDER — INSULIN DETEMIR 100 UNIT/ML ~~LOC~~ SOLN: 5.0000 [IU] | Freq: Every day | SUBCUTANEOUS | Status: DC

## 2020-10-17 MED ORDER — INSULIN NPH (HUMAN) (ISOPHANE) 100 UNIT/ML ~~LOC~~ SUSP
5.0000 [IU] | Freq: Every day | SUBCUTANEOUS | Status: DC
  Filled 2020-10-17: qty 10

## 2020-10-17 MED ORDER — INSULIN ASPART 100 UNIT/ML IJ SOLN
12.0000 [IU] | Freq: Once | INTRAMUSCULAR | Status: AC
  Administered 2020-10-17: 12 [IU] via SUBCUTANEOUS
  Filled 2020-10-17: qty 1

## 2020-10-17 MED ORDER — ACETAMINOPHEN 325 MG PO TABS: 650.0000 mg | ORAL_TABLET | Freq: Four times a day (QID) | ORAL | Status: DC | PRN

## 2020-10-17 MED ORDER — METFORMIN HCL 500 MG PO TABS: 1000.0000 mg | ORAL_TABLET | Freq: Two times a day (BID) | ORAL | Status: DC

## 2020-10-17 MED ORDER — TRAZODONE HCL 100 MG PO TABS: 100.0000 mg | ORAL_TABLET | Freq: Every evening | ORAL | Status: DC | PRN

## 2020-10-17 MED ORDER — INSULIN ASPART 100 UNIT/ML IJ SOLN
6.0000 [IU] | Freq: Three times a day (TID) | INTRAMUSCULAR | Status: DC
  Administered 2020-10-17 – 2020-10-22 (×15): 6 [IU] via SUBCUTANEOUS
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
## 2020-10-17 NOTE — ED Notes (Signed)
[place]  [employer]  DEPT  CALLED  FOR  TRANSPORT  TO [facility] ?

## 2020-10-17 NOTE — ED Notes (Signed)
VS assessed. Breakfast tray given.  

## 2020-10-17 NOTE — ED Notes (Signed)
Pt very tearful on assessment and hard to hear. Pt makes poor eye contact and minimal responses. Pt reports SI with unknown plan, does have recent suicide attempt 1 week ago where she states she used a knife, denies injury and no cuts on wrist seen from how pt was standing, did not have pt show wrists to nurse. Pt states that "everything" is going on at this time.

## 2020-10-17 NOTE — ED Notes (Signed)
Pt denies SI/HI/AVH on assessment. Pt states "I just want to sleep, leave me alone". Pt encouraged to eat and she refused stating "I don't want anything". Breakfast tray untouched.

## 2020-10-17 NOTE — ED Notes (Signed)
EDP provider Dr Larinda Buttery notified of BS 210, also notified that pt has not eaten today. He states to hold on her insulin coverage for now.

## 2020-10-17 NOTE — ED Notes (Signed)
With this nurse and EDT Ariel present, ppt removes flower print dress, flip flops, white print panties, black toned anklet--all placed in labeled pt belonging bag to be secured on nursing unit and pt changed into scrubs; pt to keep own glasses

## 2020-10-17 NOTE — ED Triage Notes (Signed)
Pt to triage via w/c with no distress noted in custody of [place] [employer] for IVC; pt admits to SI with plan and hx of same

## 2020-10-17 NOTE — ED Notes (Signed)
Patient transferred from ED to [facility] room 4 after screening for contraband. Report received from [place], RN including Situation, Background, Assessment and Recommendations. Pt oriented to unit including Q15 minute rounds as well as the security cameras for their protection. Patient is alert and oriented, warm and dry in no acute distress. Patient denies HI and AVH. Pt. Encouraged to let this nurse know if needs arise.

## 2020-10-17 NOTE — ED Notes (Signed)
Pt has not eaten breakfast. EDP Dr Larinda Buttery notified, he states to make sure pt eats before insulin coverage.

## 2020-10-17 NOTE — ED Notes (Signed)
Hourly rounding performed, patient currently awake in room. Patient has no complaints at this time. Q15 minute rounds and monitoring via Security Cameras to continue. 

## 2020-10-17 NOTE — ED Notes (Addendum)
Pt transferred to [facility] with [facility] escort. Educated pt on admission and she verbalized understanding. Pt did not eat lunch tray offered so no coverage given per EDP earlier orders to cover after pt eats. Pt reports she does not feel like eating. Personal belongings given to transportation officer to take with pt. Escorted out to officer, ambulatory and in NAD.

## 2020-10-17 NOTE — ED Notes (Signed)
Pt has yet to eat her breakfast. Will recheck BS.

## 2020-10-17 NOTE — Tx Team (Signed)
Initial Treatment Plan 10/17/2020 3:33 PM Batoul Limes [id number]    PATIENT STRESSORS: Financial difficulties Marital or family conflict   PATIENT STRENGTHS: Capable of independent living Communication skills General fund of knowledge   PATIENT IDENTIFIED PROBLEMS: Depression  Suicidal ideation    "Help feeling better"               DISCHARGE CRITERIA:  Improved stabilization in mood, thinking, and/or behavior Need for constant or close observation no longer present Reduction of life-threatening or endangering symptoms to within safe limits Verbal commitment to aftercare and medication compliance  PRELIMINARY DISCHARGE PLAN: Outpatient therapy  PATIENT/FAMILY INVOLVEMENT: This treatment plan has been presented to and reviewed with the patient, Madison Harrell.  The patient and family have been given the opportunity to ask questions and make suggestions.  Levin Bacon, RN 10/17/2020, 3:33 PM

## 2020-10-17 NOTE — [facility] Group Notes (Signed)
Type of Therapy and Topic:  Group Therapy:  Healthy and Unhealthy Supports   Participation Level:  Active    Description of Group:  Patients in this group were introduced to the idea of adding a variety of healthy supports to address the various needs in their lives. Patients discussed what additional healthy supports could be helpful in their recovery and wellness after discharge in order to prevent future hospitalizations.   An emphasis was placed on using counselor, doctor, therapy groups, 12-step groups, and problem-specific support groups to expand supports.  They also worked as a group on developing a specific plan for several patients to deal with unhealthy supports through boundary-setting, psychoeducation with loved ones, and even termination of relationships.   Therapeutic Goals:               1)  discuss importance of adding supports to stay well once out of the hospital             2)  compare healthy versus unhealthy supports and identify some examples of each             3)  generate ideas and descriptions of healthy supports that can be added             4)  offer mutual support about how to address unhealthy supports             5)  encourage active participation in and adherence to discharge plan               Summary of Patient Progress: Worksheets were provided and patient was given the opportunity to ask questions.    Therapeutic Modalities:   Motivational Interviewing Brief Solution-Focused Therapy 

## 2020-10-17 NOTE — Progress Notes (Signed)
Maybelline is a 30 year old female being admitted involuntarily to [facility] from [facility].  She presented to the ED under IVC for suicidal ideation.  She reported that her and her husband were arguing, "I told him that I didn't want to be here anymore."  Her plan was to use a knife and attempted this morning.  She also reported that she tried last week.  During [facility] admission,  she was pleasant but guarded and not very forthcoming with her answers.  Her head was down and was tearful during the admission.  She denies SI/HI or AVH.  She stated that she has been feeling depressed for some time and attributes this to her relationship with her husband.  Oriented her to the unit.  Admission paperwork completed and signed. Suicide safety plan reviewed, given to patient to complete and return to her nurse.   Belongings searched and secured in locker # 8, no contraband found.  Skin assessment completed and no skin issues noted.  Q 15 minute checks initiated for safety.  We will continue to monitor the progress towards her goals.

## 2020-10-17 NOTE — Progress Notes (Signed)
Psychoeducational Group Note  Date:  10/17/2020 Time:  2241  Group Topic/Focus:  Wrap-Up Group:   The focus of this group is to help patients review their daily goal of treatment and discuss progress on daily workbooks.  Participation Level: Did Not Attend  Participation Quality:  Not Applicable  Affect:  Not Applicable  Cognitive:  Not Applicable  Insight:  Not Applicable  Engagement in Group: Not Applicable  Additional Comments:  The patient did not attend group this evening.   Hazle Coca S 10/17/2020, 10:41 PM

## 2020-10-17 NOTE — Consult Note (Signed)
[facility] Face-to-Face Psychiatry Consult   Reason for Consult: Consult for 30-year-old woman brought into the hospital after suicide attempts at home out-of-control behavior severe depression Referring Physician: [place] Patient Identification: Madison Harrell MRN:  [id number] Principal Diagnosis: Severe recurrent major depression without psychotic features (HCC) Diagnosis:  Principal Problem:   Severe recurrent major depression without psychotic features (HCC) Active Problems:   Diabetes (HCC)   Total Time spent with patient: 1 hour  Subjective:   Madison Harrell is a 30 y.o. female patient admitted with "I am so tired".  HPI: Patient seen chart reviewed.  30-year-old woman brought in under IVC.  Husband reports that the patient has become extremely agitated and depressed and suicidal over about the last day or so.  Tried to cut herself with a kitchen knife and when that was taken away from her tried to choke herself with electrical cords.  Patient endorsed suicidal ideation on presentation.  Agitated and unable to give much other detail about depression husband reports she had been doing reasonably well although she does have chronic depression.  Not currently on any psychiatric treatment.  Used to be on medication but stopped it maybe almost a year ago.  No report of any current alcohol or drug use.  Patient has diabetes blood sugars somewhat out of control but not severe on presentation.  Past Psychiatric History: Past history of major depression with suicidal ideation.  Has been seen at [facility] in the past.  Was prescribed Zoloft in the past.  Also recorded as having been prescribed lithium a couple years ago.  No known or reported hospitalizations.  Positive past history of suicide attempts.  Risk to Self:   Risk to Others:   Prior Inpatient Therapy:   Prior Outpatient Therapy:    Past Medical History:  Past Medical History:  Diagnosis Date   Diabetes mellitus without complication (HCC)     pre E     Past Surgical History:  Procedure Laterality Date   CESAREAN SECTION  2013   Family History:  Family History  Problem Relation Age of Onset   Diabetes Father    Family Psychiatric  History: None reported Social History:  Social History   Substance and Sexual Activity  Alcohol Use No     Social History   Substance and Sexual Activity  Drug Use No    Social History   Socioeconomic History   Marital status: Married    Spouse name: Not on file   Number of children: 2   Years of education: Not on file   Highest education level: Not on file  Occupational History   Not on file  Tobacco Use   Smoking status: Former    Pack years: 0.00    Types: Cigarettes   Smokeless tobacco: Never  Vaping Use   Vaping Use: Never used  Substance and Sexual Activity   Alcohol use: No   Drug use: No   Sexual activity: Yes    Birth control/protection: None  Other Topics Concern   Not on file  Social History Narrative   Not on file   Social Determinants of Health   Financial Resource Strain: Not on file  Food Insecurity: Not on file  Transportation Needs: Not on file  Physical Activity: Not on file  Stress: Not on file  Social Connections: Not on file   Additional Social History:    Allergies:  No Known Allergies  Labs:  Results for orders placed or performed during the hospital encounter of   10/17/20 (from the past 48 hour(s))  CBC     Status: Abnormal   Collection Time: 10/17/20  4:42 AM  Result Value Ref Range   WBC 8.8 4.0 - 10.5 K/uL   RBC 5.48 (H) 3.87 - 5.11 MIL/uL   Hemoglobin 12.6 12.0 - 15.0 g/dL   HCT 39.1 36.0 - 46.0 %   MCV 71.4 (L) 80.0 - 100.0 fL   MCH 23.0 (L) 26.0 - 34.0 pg   MCHC 32.2 30.0 - 36.0 g/dL   RDW 17.5 (H) 11.5 - 15.5 %   Platelets 439 (H) 150 - 400 K/uL   nRBC 0.0 0.0 - 0.2 %    Comment: Performed at [facility], [address] [place], [place] [address]  Basic metabolic panel     Status: Abnormal   Collection Time:  10/17/20  4:42 AM  Result Value Ref Range   Sodium 136 135 - 145 mmol/L   Potassium 3.9 3.5 - 5.1 mmol/L   Chloride 105 98 - 111 mmol/L   CO2 22 22 - 32 mmol/L   Glucose, Bld 405 (H) 70 - 99 mg/dL    Comment: Glucose reference range applies only to samples taken after fasting for at least 8 hours.   BUN 14 6 - 20 mg/dL   Creatinine, Ser 0.45 0.44 - 1.00 mg/dL   Calcium 8.9 8.9 - 10.3 mg/dL   GFR, Estimated >60 >60 mL/min    Comment: (NOTE) Calculated using the CKD-EPI Creatinine Equation (2021)    Anion gap 9 5 - 15    Comment: Performed at [facility], [address] [place], [place] [address]  Urine Drug Screen, Qualitative ([facility] only)     Status: None   Collection Time: 10/17/20  4:42 AM  Result Value Ref Range   Tricyclic, Ur Screen NONE DETECTED NONE DETECTED   Amphetamines, Ur Screen NONE DETECTED NONE DETECTED   MDMA (Ecstasy)Ur Screen NONE DETECTED NONE DETECTED   Cocaine Metabolite,Ur [place] NONE DETECTED NONE DETECTED   Opiate, Ur Screen NONE DETECTED NONE DETECTED   Phencyclidine (PCP) Ur S NONE DETECTED NONE DETECTED   Cannabinoid 50 Ng, Ur [place] NONE DETECTED NONE DETECTED   Barbiturates, Ur Screen NONE DETECTED NONE DETECTED   Benzodiazepine, Ur Scrn NONE DETECTED NONE DETECTED   Methadone Scn, Ur NONE DETECTED NONE DETECTED    Comment: (NOTE) Tricyclics + metabolites, urine    Cutoff 1000 ng/mL Amphetamines + metabolites, urine  Cutoff 1000 ng/mL MDMA (Ecstasy), urine              Cutoff 500 ng/mL Cocaine Metabolite, urine          Cutoff 300 ng/mL Opiate + metabolites, urine        Cutoff 300 ng/mL Phencyclidine (PCP), urine         Cutoff 25 ng/mL Cannabinoid, urine                 Cutoff 50 ng/mL Barbiturates + metabolites, urine  Cutoff 200 ng/mL Benzodiazepine, urine              Cutoff 200 ng/mL Methadone, urine                   Cutoff 300 ng/mL  The urine drug screen provides only a preliminary, unconfirmed analytical test result and should not  be used for non-medical purposes. Clinical consideration and professional judgment should be applied to any positive drug screen result due to possible interfering substances. A more specific   alternate chemical method must be used in order to obtain a confirmed analytical result. Gas chromatography / mass spectrometry (GC/MS) is the preferred confirm atory method. Performed at [facility], [address] [place], [place] [address]   Pregnancy, urine     Status: None   Collection Time: 10/17/20  4:42 AM  Result Value Ref Range   Preg Test, Ur NEGATIVE NEGATIVE    Comment: Performed at [facility], [address] [place], [place] [address]  Resp Panel by RT-PCR (Flu A&B, Covid) Nasopharyngeal Swab     Status: None   Collection Time: 10/17/20  4:54 AM   Specimen: Nasopharyngeal Swab; Nasopharyngeal(NP) swabs in vial transport medium  Result Value Ref Range   SARS Coronavirus 2 by RT PCR NEGATIVE NEGATIVE    Comment: (NOTE) SARS-CoV-2 target nucleic acids are NOT DETECTED.  The SARS-CoV-2 RNA is generally detectable in upper respiratory specimens during the acute phase of infection. The lowest concentration of SARS-CoV-2 viral copies this assay can detect is 138 copies/mL. A negative result does not preclude SARS-Cov-2 infection and should not be used as the sole basis for treatment or other patient management decisions. A negative result may occur with  improper specimen collection/handling, submission of specimen other than nasopharyngeal swab, presence of viral mutation(s) within the areas targeted by this assay, and inadequate number of viral copies(<138 copies/mL). A negative result must be combined with clinical observations, patient history, and epidemiological information. The expected result is Negative.  Fact Sheet for Patients:  https://www.fda.gov/media/152166/download  Fact Sheet for Healthcare Providers:   https://www.fda.gov/media/152162/download  This test is no t yet approved or cleared by the [place] FDA and  has been authorized for detection and/or diagnosis of SARS-CoV-2 by FDA under an Emergency Use Authorization (EUA). This EUA will remain  in effect (meaning this test can be used) for the duration of the COVID-19 declaration under Section 564(b)(1) of the Act, 21 U.S.C.section 360bbb-3(b)(1), unless the authorization is terminated  or revoked sooner.       Influenza A by PCR NEGATIVE NEGATIVE   Influenza B by PCR NEGATIVE NEGATIVE    Comment: (NOTE) The Xpert Xpress SARS-CoV-2/FLU/RSV plus assay is intended as an aid in the diagnosis of influenza from Nasopharyngeal swab specimens and should not be used as a sole basis for treatment. Nasal washings and aspirates are unacceptable for Xpert Xpress SARS-CoV-2/FLU/RSV testing.  Fact Sheet for Patients: https://www.fda.gov/media/152166/download  Fact Sheet for Healthcare Providers: https://www.fda.gov/media/152162/download  This test is not yet approved or cleared by the [place] FDA and has been authorized for detection and/or diagnosis of SARS-CoV-2 by FDA under an Emergency Use Authorization (EUA). This EUA will remain in effect (meaning this test can be used) for the duration of the COVID-19 declaration under Section 564(b)(1) of the Act, 21 U.S.C. section 360bbb-3(b)(1), unless the authorization is terminated or revoked.  Performed at [facility], [address] [place], [place] [address]   Acetaminophen level     Status: Abnormal   Collection Time: 10/17/20  5:17 AM  Result Value Ref Range   Acetaminophen (Tylenol), Serum <10 (L) 10 - 30 ug/mL    Comment: (NOTE) Therapeutic concentrations vary significantly. A range of 10-30 ug/mL  may be an effective concentration for many patients. However, some  are best treated at concentrations outside of this range. Acetaminophen concentrations >150  ug/mL at 4 hours after ingestion  and >50 ug/mL at 12 hours after ingestion are often associated with  toxic reactions.    Performed at [facility], [address] [place], [place] [address]   Ethanol     Status: None   Collection Time: 10/17/20  5:17 AM  Result Value Ref Range   Alcohol, Ethyl (B) <10 <10 mg/dL    Comment: (NOTE) Lowest detectable limit for serum alcohol is 10 mg/dL.  For medical purposes only. Performed at [facility], [address] [place], [place] [address]   Salicylate level     Status: Abnormal   Collection Time: 10/17/20  5:17 AM  Result Value Ref Range   Salicylate Lvl <7.0 (L) 7.0 - 30.0 mg/dL    Comment: Performed at [facility], [address] [place], [place] [address]  CBG monitoring, ED     Status: Abnormal   Collection Time: 10/17/20  5:51 AM  Result Value Ref Range   Glucose-Capillary 381 (H) 70 - 99 mg/dL    Comment: Glucose reference range applies only to samples taken after fasting for at least 8 hours.  CBG monitoring, ED     Status: Abnormal   Collection Time: 10/17/20  9:30 AM  Result Value Ref Range   Glucose-Capillary 234 (H) 70 - 99 mg/dL    Comment: Glucose reference range applies only to samples taken after fasting for at least 8 hours.  CBG monitoring, ED     Status: Abnormal   Collection Time: 10/17/20 11:26 AM  Result Value Ref Range   Glucose-Capillary 210 (H) 70 - 99 mg/dL    Comment: Glucose reference range applies only to samples taken after fasting for at least 8 hours.   Comment 1 Notify RN    Comment 2 Document in Chart     Current Facility-Administered Medications  Medication Dose Route Frequency Provider Last Rate Last Admin   insulin aspart (novoLOG) injection 0-15 Units  0-15 Units Subcutaneous TID WC Sung, Jade J, MD       insulin aspart (novoLOG) injection 6 Units  6 Units Subcutaneous TID WC Javari Bufkin T, MD       [START ON 10/18/2020] insulin detemir (LEVEMIR) injection 5  Units  5 Units Subcutaneous Daily Sung, Jade J, MD       metFORMIN (GLUCOPHAGE) tablet 1,000 mg  1,000 mg Oral BID WC Kelse Ploch T, MD       Current Outpatient Medications  Medication Sig Dispense Refill   blood glucose meter kit and supplies KIT Check blood sugar 4 times daily; fasting and 2 hours after meals (Patient not taking: Reported on 10/17/2020) 1 each 0   Doxylamine-Pyridoxine (DICLEGIS) 10-10 MG TBEC Take 2 tablets by mouth at bedtime. If symptoms persist, add one tablet in the morning and one in the afternoon (Patient not taking: Reported on 10/17/2020) 100 tablet 5   famotidine (PEPCID) 20 MG tablet TAKE 1 TABLET BY MOUTH EVERY 12 HOURS (Patient not taking: Reported on 10/17/2020) 60 tablet 1   insulin aspart (NOVOLOG) 100 UNIT/ML injection Inject 6 Units into the skin 3 (three) times daily with meals. (Patient not taking: Reported on 10/17/2020) 10 mL 11   insulin NPH Human (NOVOLIN N) 100 UNIT/ML injection Inject 5 units every morning before meal (Patient not taking: Reported on 10/17/2020) 10 mL 3   metFORMIN (GLUCOPHAGE) 500 MG tablet Take 1,000 mg by mouth 2 (two) times daily with a meal.  (Patient not taking: Reported on 10/17/2020)     ondansetron (ZOFRAN ODT) 4 MG disintegrating tablet Take 1 tablet (4 mg total) by mouth every 8 (eight) hours   as needed for nausea or vomiting. (Patient not taking: Reported on 10/17/2020) 20 tablet 0   promethazine (PHENERGAN) 12.5 MG tablet TAKE 1 TABLET BY MOUTH EVERY 6 HOURS AS NEEDED FOR NAUSEA AND VOMITING (Patient not taking: Reported on 10/17/2020) 30 tablet 0    Musculoskeletal: Strength & Muscle Tone: within normal limits Gait & Station: normal Patient leans: N/A            Psychiatric Specialty Exam:  Presentation  General Appearance:  No data recorded Eye Contact: No data recorded Speech: No data recorded Speech Volume: No data recorded Handedness: No data recorded  Mood and Affect  Mood: No data  recorded Affect: No data recorded  Thought Process  Thought Processes: No data recorded Descriptions of Associations:No data recorded Orientation:No data recorded Thought Content:No data recorded History of Schizophrenia/Schizoaffective disorder:No  Duration of Psychotic Symptoms:No data recorded Hallucinations:No data recorded Ideas of Reference:No data recorded Suicidal Thoughts:No data recorded Homicidal Thoughts:No data recorded  Sensorium  Memory: No data recorded Judgment: No data recorded Insight: No data recorded  Executive Functions  Concentration: No data recorded Attention Span: No data recorded Recall: No data recorded Fund of Knowledge: No data recorded Language: No data recorded  Psychomotor Activity  Psychomotor Activity: No data recorded  Assets  Assets: No data recorded  Sleep  Sleep: No data recorded  Physical Exam: Physical Exam Vitals and nursing note reviewed.  Constitutional:      Appearance: Normal appearance.  HENT:     Head: Normocephalic and atraumatic.     Mouth/Throat:     Pharynx: Oropharynx is clear.  Eyes:     Pupils: Pupils are equal, round, and reactive to light.  Cardiovascular:     Rate and Rhythm: Normal rate and regular rhythm.  Pulmonary:     Effort: Pulmonary effort is normal.     Breath sounds: Normal breath sounds.  Abdominal:     General: Abdomen is flat.     Palpations: Abdomen is soft.  Musculoskeletal:        General: Normal range of motion.  Skin:    General: Skin is warm and dry.  Neurological:     General: No focal deficit present.     Mental Status: She is alert. Mental status is at baseline.  Psychiatric:        Attention and Perception: She is inattentive.        Mood and Affect: Mood normal. Affect is blunt.        Speech: Speech is delayed.        Behavior: Behavior is slowed and withdrawn.        Thought Content: Thought content includes suicidal ideation.        Cognition and  Memory: Memory is impaired.        Judgment: Judgment is impulsive.   Review of Systems  Constitutional: Negative.   HENT: Negative.    Eyes: Negative.   Respiratory: Negative.    Cardiovascular: Negative.   Gastrointestinal: Negative.   Musculoskeletal: Negative.   Skin: Negative.   Neurological: Negative.   Psychiatric/Behavioral:  Positive for depression and suicidal ideas.   Blood pressure 116/85, pulse 97, temperature 98.7 F (37.1 C), temperature source Oral, resp. rate 20, height 5' (1.524 m), weight 76.7 kg, SpO2 100 %, unknown if currently breastfeeding. Body mass index is 33.01 kg/m.  Treatment Plan Summary: Medication management and Plan patient's blood sugars have gotten under better control with repeated blood checks and insulin.  She has been  started back on her outpatient diabetes regimen.  Patient [facility] [place].  Patient has been informed of the plan.  Treatment team and ER physician informed.  Orders will be placed for readmission.  Supportive counseling and encouragement to the patient.  Did not resume any antidepressants yet at this point as she will be transferred today  Disposition: Recommend psychiatric Inpatient admission when medically cleared.  Alethia Berthold, MD 10/17/2020 11:39 AM
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
## 2020-10-17 NOTE — ED Notes (Addendum)
Pt asleep, lunch tray laced on chair in rm. Pt made aware that tray is there.

## 2020-10-17 NOTE — BH Assessment (Signed)
Writer spoke with patient's nurse, Nicole Cella that patient is accepted pending the results of her blood sugar test. Nicole Cella verbalized understanding and will complete.

## 2020-10-17 NOTE — ED Notes (Signed)
IVC prior to arrival/ Consult ordered/ Put in BHU-4

## 2020-10-17 NOTE — BH Assessment (Addendum)
Patient has been accepted to [facility] on today 10/17/20.  Patient assigned to room 402, bed 1 Accepting physician is Dr. Lucianne Muss.  Call report to [phone].  Representative was Linsey.   ER Staff is aware of it:  Misty Stanley, ER Secretary  Dr. Larinda Buttery, ER MD  Nicole Cella, Patient's Nurse     Patient's Family/Support System [facility] [phone]) has been updated as well.

## 2020-10-18 DIAGNOSIS — F332 Major depressive disorder, recurrent severe without psychotic features: Principal | ICD-10-CM

## 2020-10-18 LAB — GLUCOSE, CAPILLARY
Glucose-Capillary: 253 mg/dL — ABNORMAL HIGH (ref 70–99)
Glucose-Capillary: 272 mg/dL — ABNORMAL HIGH (ref 70–99)
Glucose-Capillary: 342 mg/dL — ABNORMAL HIGH (ref 70–99)
Glucose-Capillary: 309 mg/dL — ABNORMAL HIGH (ref 70–99)

## 2020-10-18 MED ORDER — TRAZODONE HCL 50 MG PO TABS
50.0000 mg | ORAL_TABLET | Freq: Every evening | ORAL | Status: DC | PRN
  Administered 2020-10-20 – 2020-10-21 (×2): 50 mg via ORAL
  Filled 2020-10-18 (×2): qty 1

## 2020-10-18 MED ORDER — METFORMIN HCL 500 MG PO TABS
500.0000 mg | ORAL_TABLET | Freq: Two times a day (BID) | ORAL | Status: DC
  Administered 2020-10-19 – 2020-10-22 (×7): 500 mg via ORAL
  Filled 2020-10-18 (×11): qty 1

## 2020-10-18 MED ORDER — HYDROXYZINE HCL 25 MG PO TABS
25.0000 mg | ORAL_TABLET | Freq: Three times a day (TID) | ORAL | Status: DC | PRN
  Administered 2020-10-18 – 2020-10-22 (×5): 25 mg via ORAL
  Filled 2020-10-18 (×5): qty 1

## 2020-10-18 MED ORDER — INSULIN ASPART 100 UNIT/ML IJ SOLN
2.0000 [IU] | Freq: Once | INTRAMUSCULAR | Status: AC
  Administered 2020-10-19: 2 [IU] via SUBCUTANEOUS

## 2020-10-18 MED ORDER — INSULIN DETEMIR 100 UNIT/ML ~~LOC~~ SOLN
8.0000 [IU] | Freq: Every day | SUBCUTANEOUS | Status: DC
  Administered 2020-10-19: 8 [IU] via SUBCUTANEOUS

## 2020-10-18 MED ORDER — FLUOXETINE HCL 10 MG PO CAPS
10.0000 mg | ORAL_CAPSULE | Freq: Every day | ORAL | Status: DC
  Administered 2020-10-18 – 2020-10-19 (×2): 10 mg via ORAL
  Filled 2020-10-18 (×6): qty 1

## 2020-10-18 NOTE — BHH Suicide Risk Assessment (Signed)
[facility] Admission Suicide Risk Assessment   Nursing information obtained from:  Patient Demographic factors:  Young adult, Caucasian Current Mental Status:  Suicidal ideation indicated by patient Loss Factors:  quit job Historical Factors: previous psychiatric treatment Risk Reduction Factors:  Responsible for children under 29 years of age  Total Time Spent in Direct Patient Care:  I personally spent 60 minutes on the unit in direct patient care. The direct patient care time included face-to-face time with the patient, reviewing the patient's chart, communicating with other professionals, and coordinating care. Greater than 50% of this time was spent in counseling or coordinating care with the patient regarding goals of hospitalization, psycho-education, and discharge planning needs.  Principal Problem: Severe recurrent major depression without psychotic features (HCC) Diagnosis:  Principal Problem:   Severe recurrent major depression without psychotic features (HCC)  Subjective Data: see admission H&P  Continued Clinical Symptoms:  Alcohol Use Disorder Identification Test Final Score (AUDIT): 3 The "Alcohol Use Disorders Identification Test", Guidelines for Use in Primary Care, Second Edition.  World Science writer [facility]). Score between 0-7:  no or low risk or alcohol related problems. Score between 8-15:  moderate risk of alcohol related problems. Score between 16-19:  high risk of alcohol related problems. Score 20 or above:  warrants further diagnostic evaluation for alcohol dependence and treatment.  Musculoskeletal: Strength & Muscle Tone: within normal limits Gait & Station: normal, steady Patient leans: N/A   Psychiatric Specialty Exam: Physical Exam Vitals reviewed. HENT:    Head: Normocephalic.    Mouth/Throat:    Mouth: Mucous membranes are moist. Eyes:    Extraocular Movements: Extraocular movements intact. Cardiovascular:    Rate and Rhythm: Normal rate and  regular rhythm. Pulmonary:    Effort: Pulmonary effort is normal.    Breath sounds: Normal breath sounds. Musculoskeletal:        General: Normal range of motion. Skin:    General: Skin is warm and dry. Neurological:    General: No focal deficit present.    Mental Status: She is alert.    Comments: CN 3-12 grossly intact; intact finger to nose     Review of Systems  Blood pressure 129/84, pulse 95, temperature 98.8 F (37.1 C), temperature source Oral, resp. rate 18, height 4\' 11"  (1.499 m), weight 74.8 kg, SpO2 99 %, unknown if currently breastfeeding.Body mass index is 33.33 kg/m.  General Appearance:  casually dressed, adequate hygiene  Eye Contact:  Fair  Speech:  Clear and Coherent and Normal Rate  Volume:  Normal  Mood:  Anxious and Depressed  Affect:  Constricted and Tearful  Thought Process:  circumstantial  Orientation:  Full (Time, Place, and Person)  Thought Content:  Logical and denies AVH, paranoia, or delusions  Suicidal Thoughts:   suicidal prior to admission; denies current SI, intent or plan  Homicidal Thoughts:  No  Memory:  Recent;   Good  Judgement:  Fair  Insight:  Fair  Psychomotor Activity:  Normal  Concentration:  Concentration: Good and Attention Span: Good  Recall:  Good  Fund of Knowledge:  Good  Language:  Good  Akathisia:  Negative  Assets:  Communication Skills Desire for Improvement Housing Resilience Social Support  ADL's:  Intact  Cognition:  WNL  Sleep:  Number of Hours: 6.5   CLINICAL FACTORS:   Depression:   Anhedonia Hopelessness Insomnia Severe  COGNITIVE FEATURES THAT CONTRIBUTE TO RISK:  None    SUICIDE RISK:   Moderate:  Frequent suicidal ideation with limited intensity,  and duration, some specificity in terms of plans, no associated intent, good self-control, limited dysphoria/symptomatology, some risk factors present, and identifiable protective factors, including available and accessible social support.  PLAN OF  CARE: see admission H&P  I certify that inpatient services furnished can reasonably be expected to improve the patient's condition.   Comer Locket, MD, FAPA 10/18/2020, 3:33 PM

## 2020-10-18 NOTE — BHH Group Notes (Signed)
BHH Group Notes:  (Nursing)  Date:  10/18/2020  Time: 1600  Type of Therapy:  Psychoeducational Skills  Participation Level:  Active  Participation Quality:  Appropriate and Attentive  Affect:  Appropriate  Cognitive:  Alert and Appropriate  Insight:  Appropriate and Good  Engagement in Group:  Engaged  Modes of Intervention:  Discussion, Exploration, Rapport Building, Socialization, and Support  Summary of Progress/Problems:  Group played a non- competitive learning/communication game that fosters listening skills as well as self expression.  Shela Nevin 10/18/2020, 6:11 PM

## 2020-10-18 NOTE — Progress Notes (Addendum)
   10/18/20 1100  Psych Admission Type (Psych Patients Only)  Admission Status Involuntary  Psychosocial Assessment  Patient Complaints Depression  Eye Contact Brief  Facial Expression Sad  Affect Sad;Depressed  Speech Soft;Slow  Interaction Minimal  Motor Activity Slow  Appearance/Hygiene Disheveled  Behavior Characteristics Cooperative;Calm  Mood Sad  Thought Process  Coherency WDL  Content WDL  Delusions None reported or observed  Perception WDL  Hallucination None reported or observed  Judgment WDL  Confusion WDL  Danger to Self  Current suicidal ideation? Passive  Self-Injurious Behavior Some self-injurious ideation observed or expressed.  No lethal plan expressed   Agreement Not to Harm Self Yes  Description of Agreement Verbal agreement to not harm herself  Danger to Others  Danger to Others None reported or observed   D. Pt presents with a sad affect/ depressed mood - per pt's self inventory, pt rated her depression, hopelessness and anxiety a 5/5/3, respectively.Pt currently denies SI/HI and AVH   A. Labs and vitals monitored. Pt given and educated on medications. Pt supported emotionally and encouraged to express concerns and ask questions.   R. Pt remains safe with 15 minute checks. Will continue POC.
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
## 2020-10-18 NOTE — Progress Notes (Signed)
The patient's positive event for the day is that she has been out of bed more. She made more friends today
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
## 2020-10-18 NOTE — Progress Notes (Signed)
Nutrition Brief Note RD working remotely.   Patient identified on the Malnutrition Screening Tool (MST) Report  Wt Readings from Last 15 Encounters:  10/17/20 74.8 kg  10/17/20 76.7 kg  11/11/19 70.3 kg  11/04/19 70.5 kg  10/28/19 71.7 kg  10/28/19 71.7 kg  09/30/19 73.5 kg  09/28/19 73 kg  09/19/19 73 kg  05/02/19 76.3 kg  10/01/18 76 kg  07/02/18 74.8 kg  09/02/17 75.3 kg  08/13/17 80.7 kg  04/22/17 75.3 kg    Body mass index is 33.33 kg/m. Patient meets criteria for obesity based on current BMI.   Current diet order is Carb Modified and she is eating as desired for meals and snacks at this time. Labs and medications reviewed.   No nutrition interventions warranted at this time. If nutrition issues arise, please consult RD.       Trenton Gammon, MS, RD, LDN, CNSC Inpatient Clinical Dietitian RD pager # available in [facility]  After hours/weekend pager # available in [facility]

## 2020-10-18 NOTE — Progress Notes (Addendum)
Inpatient Diabetes Program Recommendations  AACE/ADA: New Consensus Statement on Inpatient Glycemic Control (2015)  Target Ranges:  Prepandial:   less than 140 mg/dL      Peak postprandial:   less than 180 mg/dL (1-2 hours)      Critically ill patients:  140 - 180 mg/dL   Lab Results  Component Value Date   GLUCAP 253 (H) 10/18/2020   HGBA1C 10.2 (H) 09/28/2019    Review of Glycemic Control Results for Madison Harrell, Madison Harrell (MRN [id number]) as of 10/18/2020 11:43  Ref. Range 10/17/2020 09:30 10/17/2020 11:26 10/17/2020 16:52 10/17/2020 20:25 10/18/2020 05:31  Glucose-Capillary Latest Ref Range: 70 - 99 mg/dL 536 (H) 644 (H) 034 (H) 288 (H) 253 (H)   Diabetes history: DM 2 Outpatient Diabetes medications:  Novolog 6 units tid with meals NPH 5 units tid with meals?  Metformin 1000 mg bid Current orders for Inpatient glycemic control:  Novolog moderate tid with meals  Novolog 6 units tid with meals Levemir 5 units daily Inpatient Diabetes Program Recommendations:    Please increase Levemir to 8 units bid.   Thanks,  Beryl Meager, RN, BC-ADM Inpatient Diabetes Coordinator Pager [phone]  (8a-5p)  Addendum:  Looks like when she was discharged from the hospital after delivery of baby in 12/21, and she was discharged on Metformin 500 mg bid and Trulicity once a week. I do not see any f/u appointments in the care anywhere tab? Therefore, she was not supposed to be on insulin at discharge/postpartum?  Consider Metformin 500 mg bid and follow.

## 2020-10-19 LAB — CBC WITH DIFFERENTIAL/PLATELET
Abs Immature Granulocytes: 0.03 10*3/uL (ref 0.00–0.07)
Basophils Absolute: 0 10*3/uL (ref 0.0–0.1)
Basophils Relative: 0 %
Eosinophils Absolute: 0.2 10*3/uL (ref 0.0–0.5)
Eosinophils Relative: 3 %
HCT: 39.2 % (ref 36.0–46.0)
Hemoglobin: 11.7 g/dL — ABNORMAL LOW (ref 12.0–15.0)
Immature Granulocytes: 0 %
Lymphocytes Relative: 48 %
Lymphs Abs: 3.3 10*3/uL (ref 0.7–4.0)
MCH: 22.5 pg — ABNORMAL LOW (ref 26.0–34.0)
MCHC: 29.8 g/dL — ABNORMAL LOW (ref 30.0–36.0)
MCV: 75.5 fL — ABNORMAL LOW (ref 80.0–100.0)
Monocytes Absolute: 0.5 10*3/uL (ref 0.1–1.0)
Monocytes Relative: 7 %
Neutro Abs: 3 10*3/uL (ref 1.7–7.7)
Neutrophils Relative %: 42 %
Platelets: 417 10*3/uL — ABNORMAL HIGH (ref 150–400)
RBC: 5.19 MIL/uL — ABNORMAL HIGH (ref 3.87–5.11)
RDW: 18.4 % — ABNORMAL HIGH (ref 11.5–15.5)
WBC: 7.1 10*3/uL (ref 4.0–10.5)
nRBC: 0 % (ref 0.0–0.2)

## 2020-10-19 LAB — GLUCOSE, CAPILLARY
Glucose-Capillary: 221 mg/dL — ABNORMAL HIGH (ref 70–99)
Glucose-Capillary: 262 mg/dL — ABNORMAL HIGH (ref 70–99)
Glucose-Capillary: 276 mg/dL — ABNORMAL HIGH (ref 70–99)
Glucose-Capillary: 320 mg/dL — ABNORMAL HIGH (ref 70–99)
Glucose-Capillary: 338 mg/dL — ABNORMAL HIGH (ref 70–99)

## 2020-10-19 LAB — TSH: TSH: 1.615 u[IU]/mL (ref 0.350–4.500)

## 2020-10-19 MED ORDER — INSULIN DETEMIR 100 UNIT/ML ~~LOC~~ SOLN
10.0000 [IU] | Freq: Every day | SUBCUTANEOUS | Status: DC
  Administered 2020-10-19 – 2020-10-21 (×3): 10 [IU] via SUBCUTANEOUS

## 2020-10-19 MED ORDER — INSULIN DETEMIR 100 UNIT/ML ~~LOC~~ SOLN: 10.0000 [IU] | Freq: Two times a day (BID) | SUBCUTANEOUS | Status: DC

## 2020-10-19 MED ORDER — INSULIN DETEMIR 100 UNIT/ML ~~LOC~~ SOLN
15.0000 [IU] | Freq: Every day | SUBCUTANEOUS | Status: DC
  Administered 2020-10-20 – 2020-10-22 (×3): 15 [IU] via SUBCUTANEOUS

## 2020-10-19 MED ORDER — NICOTINE 14 MG/24HR TD PT24: 14.0000 mg | MEDICATED_PATCH | Freq: Every day | TRANSDERMAL | Status: DC | PRN

## 2020-10-19 MED ORDER — INSULIN DETEMIR 100 UNIT/ML ~~LOC~~ SOLN: 15.0000 [IU] | Freq: Every day | SUBCUTANEOUS | Status: DC

## 2020-10-19 MED ORDER — INSULIN DETEMIR 100 UNIT/ML ~~LOC~~ SOLN: 15.0000 [IU] | Freq: Two times a day (BID) | SUBCUTANEOUS | Status: DC

## 2020-10-19 NOTE — Tx Team (Signed)
Interdisciplinary Treatment and Diagnostic Plan Update  10/19/2020 Time of Session:  Madison Harrell MRN: [id number]  Principal Diagnosis: Severe recurrent major depression without psychotic features [facility])  Secondary Diagnoses: Principal Problem:   Severe recurrent major depression without psychotic features ([place])   Current Medications:  Current Facility-Administered Medications  Medication Dose Route Frequency Provider Last Rate Last Admin   acetaminophen (TYLENOL) tablet 650 mg  650 mg Oral Q6H PRN Clapacs, Madie Reno, MD       alum & mag hydroxide-simeth (MAALOX/MYLANTA) 200-200-20 MG/5ML suspension 30 mL  30 mL Oral Q4H PRN Clapacs, Madie Reno, MD       FLUoxetine (PROZAC) capsule 10 mg  10 mg Oral Daily Nelda Marseille, Amy E, MD   10 mg at 10/19/20 0800   hydrOXYzine (ATARAX/VISTARIL) tablet 25 mg  25 mg Oral TID PRN Harlow Asa, MD   25 mg at 10/18/20 2105   insulin aspart (novoLOG) injection 0-15 Units  0-15 Units Subcutaneous TID WC Clapacs, Madie Reno, MD   5 Units at 10/19/20 0626   insulin aspart (novoLOG) injection 6 Units  6 Units Subcutaneous TID WC Clapacs, Madie Reno, MD   6 Units at 10/19/20 3299   insulin detemir (LEVEMIR) injection 8 Units  8 Units Subcutaneous Daily Harlow Asa, MD   8 Units at 10/19/20 0759   magnesium hydroxide (MILK OF MAGNESIA) suspension 30 mL  30 mL Oral Daily PRN Clapacs, Madie Reno, MD       metFORMIN (GLUCOPHAGE) tablet 500 mg  500 mg Oral BID WC Nelda Marseille, Amy E, MD   500 mg at 10/19/20 0800   traZODone (DESYREL) tablet 50 mg  50 mg Oral QHS PRN Harlow Asa, MD       PTA Medications: Medications Prior to Admission  Medication Sig Dispense Refill Last Dose   blood glucose meter kit and supplies KIT Check blood sugar 4 times daily; fasting and 2 hours after meals (Patient not taking: Reported on 10/17/2020) 1 each 0    Doxylamine-Pyridoxine (DICLEGIS) 10-10 MG TBEC Take 2 tablets by mouth at bedtime. If symptoms persist, add one tablet in the morning and  one in the afternoon (Patient not taking: Reported on 10/17/2020) 100 tablet 5    famotidine (PEPCID) 20 MG tablet TAKE 1 TABLET BY MOUTH EVERY 12 HOURS (Patient not taking: Reported on 10/17/2020) 60 tablet 1    insulin aspart (NOVOLOG) 100 UNIT/ML injection Inject 6 Units into the skin 3 (three) times daily with meals. (Patient not taking: Reported on 10/17/2020) 10 mL 11    insulin NPH Human (NOVOLIN N) 100 UNIT/ML injection Inject 5 units every morning before meal (Patient not taking: Reported on 10/17/2020) 10 mL 3    metFORMIN (GLUCOPHAGE) 500 MG tablet Take 1,000 mg by mouth 2 (two) times daily with a meal.  (Patient not taking: Reported on 10/17/2020)      ondansetron (ZOFRAN ODT) 4 MG disintegrating tablet Take 1 tablet (4 mg total) by mouth every 8 (eight) hours as needed for nausea or vomiting. (Patient not taking: Reported on 10/17/2020) 20 tablet 0    promethazine (PHENERGAN) 12.5 MG tablet TAKE 1 TABLET BY MOUTH EVERY 6 HOURS AS NEEDED FOR NAUSEA AND VOMITING (Patient not taking: Reported on 10/17/2020) 30 tablet 0     Patient Stressors: Financial difficulties Marital or family conflict  Patient Strengths: Capable of independent living Curator fund of knowledge  Treatment Modalities: Medication Management, Group therapy, Case management,  1 to 1 session with  clinician, Psychoeducation, Recreational therapy.   Physician Treatment Plan for Primary Diagnosis: Severe recurrent major depression without psychotic features ([place]) Long Term Goal(s): Improvement in symptoms so as ready for discharge   Short Term Goals: Ability to verbalize feelings will improve Ability to demonstrate self-control will improve Ability to identify and develop effective coping behaviors will improve  Medication Management: Evaluate patient's response, side effects, and tolerance of medication regimen.  Therapeutic Interventions: 1 to 1 sessions, Unit Group sessions and Medication  administration.  Evaluation of Outcomes: Not Met  Physician Treatment Plan for Secondary Diagnosis: Principal Problem:   Severe recurrent major depression without psychotic features ([place])  Long Term Goal(s): Improvement in symptoms so as ready for discharge   Short Term Goals: Ability to verbalize feelings will improve Ability to demonstrate self-control will improve Ability to identify and develop effective coping behaviors will improve     Medication Management: Evaluate patient's response, side effects, and tolerance of medication regimen.  Therapeutic Interventions: 1 to 1 sessions, Unit Group sessions and Medication administration.  Evaluation of Outcomes: Not Met   RN Treatment Plan for Primary Diagnosis: Severe recurrent major depression without psychotic features ([place]) Long Term Goal(s): Knowledge of disease and therapeutic regimen to maintain health will improve  Short Term Goals: Ability to participate in decision making will improve, Ability to verbalize feelings will improve, and Ability to disclose and discuss suicidal ideas  Medication Management: RN will administer medications as ordered by provider, will assess and evaluate patient's response and provide education to patient for prescribed medication. RN will report any adverse and/or side effects to prescribing provider.  Therapeutic Interventions: 1 on 1 counseling sessions, Psychoeducation, Medication administration, Evaluate responses to treatment, Monitor vital signs and CBGs as ordered, Perform/monitor CIWA, COWS, AIMS and Fall Risk screenings as ordered, Perform wound care treatments as ordered.  Evaluation of Outcomes: Not Met   LCSW Treatment Plan for Primary Diagnosis: Severe recurrent major depression without psychotic features ([place]) Long Term Goal(s): Safe transition to appropriate next level of care at discharge, Engage patient in therapeutic group addressing interpersonal concerns.  Short Term Goals:  Engage patient in aftercare planning with referrals and resources, Increase emotional regulation, Facilitate acceptance of mental health diagnosis and concerns, and Identify triggers associated with mental health/substance abuse issues  Therapeutic Interventions: Assess for all discharge needs, 1 to 1 time with Social worker, Explore available resources and support systems, Assess for adequacy in community support network, Educate family and significant other(s) on suicide prevention, Complete Psychosocial Assessment, Interpersonal group therapy.  Evaluation of Outcomes: Not Met   Progress in Treatment: Attending groups: Yes. and No. Participating in groups: Yes. and No. Taking medication as prescribed: Yes. Toleration medication: Yes. Family/Significant other contact made: Yes, individual(s) contacted:  brother Patient understands diagnosis: Yes. Discussing patient identified problems/goals with staff: Yes. Medical problems stabilized or resolved: Yes. Denies suicidal/homicidal ideation: Yes. Issues/concerns per patient self-inventory: No. Other: None  New problem(s) identified: No, Describe:  None  New Short Term/Long Term Goal(s):medication stabilization, elimination of SI thoughts, development of comprehensive mental wellness plan.   Patient Goals:  "to get on medications to help stabilize my mood and give me more energy."  Discharge Plan or Barriers: Patient recently admitted. CSW will continue to follow and assess for appropriate referrals and possible discharge planning.   Reason for Continuation of Hospitalization: Depression Medication stabilization Suicidal ideation  Estimated Length of Stay: 3-5 days  Attendees: Patient: Madison Harrell 10/19/2020 10:18 AM  Physician:  10/19/2020 10:18 AM  Nursing:  10/19/2020 10:18 AM  RN Care Manager: 10/19/2020 10:18 AM  Social Worker: Madison Harrell, [place] 10/19/2020 10:18 AM  Recreational Therapist:  10/19/2020 10:18 AM  Other:   10/19/2020 10:18 AM  Other:  10/19/2020 10:18 AM  Other: 10/19/2020 10:18 AM    Scribe for Treatment Team: Mliss Fritz, [place] 10/19/2020 10:18 AM

## 2020-10-19 NOTE — Progress Notes (Addendum)
[facility] MD Progress Note  10/19/2020 2:50 PM Djuna Frechette  MRN:  [id number]  Subjective: Madison Harrell is a 30 y.o. female with a self-reported h/o depression, who was admitted under IVC from [facility] ED for worsening depression and suicidal ideation. The patient reportedly was brought to the ED by her husband after attempting to cut herself with a kitchen knife and later trying to choke herself with an electrical cord.The patient is currently on Hospital Day 2.   Chart Review from last 24 hours:  The patient's chart was reviewed and nursing notes were reviewed. The patient's case was discussed in multidisciplinary team meeting. Per nursing, she has attended groups and been visible in the milieu. She has had no acute safety concerns or behavioral issues noted. Per MAR she has been compliant with scheduled medications and did receive Vistaril X1 for anxiety.  Information Obtained Today During Patient Interview: The patient was seen and evaluated on the unit. On assessment today the patient reports that she has talked to her children on the phone but has elected not to talk to her husband because she feels he "is an emotional trigger" for her. She is undecided if she wants to stay with her husband after discharge stating that they would need to work on their marital discord if she returns to the home. She is emotional today talking about her children. She denies AVH, SI, HI, or paranoia. She states she had no side-effects with start of Prozac and does feel her mood is a little more hopeful today. She denies urges for SIB. She states she took Vistaril last night and feels a little oversedated today but slept well overnight. She has been attending groups and states she has been interacting with peers. She reports good appetite and voices no physical complaints.   Principal Problem: Severe recurrent major depression without psychotic features (HCC) Diagnosis: Principal Problem:   Severe recurrent major  depression without psychotic features (HCC) Active Problems:   Diabetes (HCC)  Total Time Spent in Direct Patient Care:  I personally spent 30 minutes on the unit in direct patient care. The direct patient care time included face-to-face time with the patient, reviewing the patient's chart, communicating with other professionals, and coordinating care. Greater than 50% of this time was spent in counseling or coordinating care with the patient regarding goals of hospitalization, psycho-education, and discharge planning needs.  Past Psychiatric History: see admission H&P  Past Medical History:  Past Medical History:  Diagnosis Date   Diabetes mellitus without complication (HCC)    pre E     Past Surgical History:  Procedure Laterality Date   CESAREAN SECTION  2013   Family History:  Family History  Problem Relation Age of Onset   Diabetes Father    Family Psychiatric  History: see admission H&P  Social History:  Social History   Substance and Sexual Activity  Alcohol Use No     Social History   Substance and Sexual Activity  Drug Use No    Social History   Socioeconomic History   Marital status: Married    Spouse name: Not on file   Number of children: 2   Years of education: Not on file   Highest education level: Not on file  Occupational History   Not on file  Tobacco Use   Smoking status: Former    Pack years: 0.00    Types: Cigarettes   Smokeless tobacco: Never  Vaping Use   Vaping Use: Never used  Substance  and Sexual Activity   Alcohol use: No   Drug use: No   Sexual activity: Yes    Birth control/protection: None  Other Topics Concern   Not on file  Social History Narrative   Not on file   Social Determinants of Health   Financial Resource Strain: Not on file  Food Insecurity: Not on file  Transportation Needs: Not on file  Physical Activity: Not on file  Stress: Not on file  Social Connections: Not on file   Sleep: Good  Appetite:   Good  Current Medications: Current Facility-Administered Medications  Medication Dose Route Frequency Provider Last Rate Last Admin   acetaminophen (TYLENOL) tablet 650 mg  650 mg Oral Q6H PRN Clapacs, Jackquline Denmark, MD       alum & mag hydroxide-simeth (MAALOX/MYLANTA) 200-200-20 MG/5ML suspension 30 mL  30 mL Oral Q4H PRN Clapacs, Jackquline Denmark, MD       FLUoxetine (PROZAC) capsule 10 mg  10 mg Oral Daily Mason Jim, Teleah Villamar E, MD   10 mg at 10/19/20 0800   hydrOXYzine (ATARAX/VISTARIL) tablet 25 mg  25 mg Oral TID PRN Comer Locket, MD   25 mg at 10/18/20 2105   insulin aspart (novoLOG) injection 0-15 Units  0-15 Units Subcutaneous TID WC Clapacs, Jackquline Denmark, MD   11 Units at 10/19/20 1209   insulin aspart (novoLOG) injection 6 Units  6 Units Subcutaneous TID WC Clapacs, Jackquline Denmark, MD   6 Units at 10/19/20 1208   insulin detemir (LEVEMIR) injection 10 Units  10 Units Subcutaneous QHS Comer Locket, MD       [START ON 10/20/2020] insulin detemir (LEVEMIR) injection 15 Units  15 Units Subcutaneous Daily Lacorey Brusca E, MD       magnesium hydroxide (MILK OF MAGNESIA) suspension 30 mL  30 mL Oral Daily PRN Clapacs, John T, MD       metFORMIN (GLUCOPHAGE) tablet 500 mg  500 mg Oral BID WC Mason Jim, Alayjah Boehringer E, MD   500 mg at 10/19/20 0800   traZODone (DESYREL) tablet 50 mg  50 mg Oral QHS PRN Comer Locket, MD        Lab Results:  Results for orders placed or performed during the hospital encounter of 10/17/20 (from the past 48 hour(s))  Glucose, capillary     Status: Abnormal   Collection Time: 10/17/20  4:52 PM  Result Value Ref Range   Glucose-Capillary 239 (H) 70 - 99 mg/dL    Comment: Glucose reference range applies only to samples taken after fasting for at least 8 hours.  Glucose, capillary     Status: Abnormal   Collection Time: 10/17/20  8:25 PM  Result Value Ref Range   Glucose-Capillary 288 (H) 70 - 99 mg/dL    Comment: Glucose reference range applies only to samples taken after fasting for at  least 8 hours.  Glucose, capillary     Status: Abnormal   Collection Time: 10/18/20  5:31 AM  Result Value Ref Range   Glucose-Capillary 253 (H) 70 - 99 mg/dL    Comment: Glucose reference range applies only to samples taken after fasting for at least 8 hours.  Glucose, capillary     Status: Abnormal   Collection Time: 10/18/20 11:57 AM  Result Value Ref Range   Glucose-Capillary 272 (H) 70 - 99 mg/dL    Comment: Glucose reference range applies only to samples taken after fasting for at least 8 hours.  Glucose, capillary     Status: Abnormal  Collection Time: 10/18/20  5:06 PM  Result Value Ref Range   Glucose-Capillary 342 (H) 70 - 99 mg/dL    Comment: Glucose reference range applies only to samples taken after fasting for at least 8 hours.  Glucose, capillary     Status: Abnormal   Collection Time: 10/18/20  8:25 PM  Result Value Ref Range   Glucose-Capillary 309 (H) 70 - 99 mg/dL    Comment: Glucose reference range applies only to samples taken after fasting for at least 8 hours.   Comment 1 Notify RN   Glucose, capillary     Status: Abnormal   Collection Time: 10/19/20 12:16 AM  Result Value Ref Range   Glucose-Capillary 276 (H) 70 - 99 mg/dL    Comment: Glucose reference range applies only to samples taken after fasting for at least 8 hours.   Comment 1 Notify RN    Comment 2 Document in Chart   Glucose, capillary     Status: Abnormal   Collection Time: 10/19/20  5:46 AM  Result Value Ref Range   Glucose-Capillary 221 (H) 70 - 99 mg/dL    Comment: Glucose reference range applies only to samples taken after fasting for at least 8 hours.   Comment 1 Notify RN   TSH     Status: None   Collection Time: 10/19/20  6:28 AM  Result Value Ref Range   TSH 1.615 0.350 - 4.500 uIU/mL    Comment: Performed by a 3rd Generation assay with a functional sensitivity of <=0.01 uIU/mL. Performed at [facility], [address] [place], [place] [address]   CBC with  Differential/Platelet     Status: Abnormal   Collection Time: 10/19/20  6:28 AM  Result Value Ref Range   WBC 7.1 4.0 - 10.5 K/uL   RBC 5.19 (H) 3.87 - 5.11 MIL/uL   Hemoglobin 11.7 (L) 12.0 - 15.0 g/dL   HCT 60.439.2 54.036.0 - 98.146.0 %   MCV 75.5 (L) 80.0 - 100.0 fL   MCH 22.5 (L) 26.0 - 34.0 pg   MCHC 29.8 (L) 30.0 - 36.0 g/dL   RDW 19.118.4 (H) 47.811.5 - 29.515.5 %   Platelets 417 (H) 150 - 400 K/uL   nRBC 0.0 0.0 - 0.2 %   Neutrophils Relative % 42 %   Neutro Abs 3.0 1.7 - 7.7 K/uL   Lymphocytes Relative 48 %   Lymphs Abs 3.3 0.7 - 4.0 K/uL   Monocytes Relative 7 %   Monocytes Absolute 0.5 0.1 - 1.0 K/uL   Eosinophils Relative 3 %   Eosinophils Absolute 0.2 0.0 - 0.5 K/uL   Basophils Relative 0 %   Basophils Absolute 0.0 0.0 - 0.1 K/uL   Immature Granulocytes 0 %   Abs Immature Granulocytes 0.03 0.00 - 0.07 K/uL    Comment: Performed at [facility], [address] [place], [place] [address]  Glucose, capillary     Status: Abnormal   Collection Time: 10/19/20 11:53 AM  Result Value Ref Range   Glucose-Capillary 320 (H) 70 - 99 mg/dL    Comment: Glucose reference range applies only to samples taken after fasting for at least 8 hours.    Blood Alcohol level:  Lab Results  Component Value Date   ETH <10 10/17/2020    Metabolic Disorder Labs: Lab Results  Component Value Date   HGBA1C 10.2 (H) 09/28/2019   MPG 298 05/02/2019   MPG 159.94 08/01/2018   No results found for: PROLACTIN No  results found for: CHOL, TRIG, HDL, CHOLHDL, VLDL, LDLCALC  Physical Findings: AIMS: Facial and Oral Movements Muscles of Facial Expression: None, normal Lips and Perioral Area: None, normal Jaw: None, normal Tongue: None, normal,Extremity Movements Upper (arms, wrists, hands, fingers): None, normal Lower (legs, knees, ankles, toes): None, normal, Trunk Movements Neck, shoulders, hips: None, normal, Overall Severity Severity of abnormal movements (highest score from questions  above): None, normal Incapacitation due to abnormal movements: None, normal Patient's awareness of abnormal movements (rate only patient's report): No Awareness, Dental Status Current problems with teeth and/or dentures?: No Does patient usually wear dentures?: No     Musculoskeletal: Strength & Muscle Tone: within normal limits Gait & Station: normal, steady Patient leans: N/A  General Appearance:  casually dressed, adequate hygiene  Eye Contact:  Fair  Speech:  Clear and Coherent and Normal Rate  Volume:  Normal  Mood:  dysphoric  Affect:  Constricted and Tearful  Thought Process:  goal directed and linear  Orientation:  Full (Time, Place, and Person)  Thought Content:  Logical and denies AVH, paranoia, or delusions  Suicidal Thoughts:   Denied  Homicidal Thoughts:  No  Memory:  Recent;   Good  Judgement:  Fair  Insight:  Fair  Psychomotor Activity:  Normal  Concentration:  Concentration: Good and Attention Span: Good  Recall:  Good  Fund of Knowledge:  Good  Language:  Good  Akathisia:  Negative  Assets:  Communication Skills Desire for Improvement Housing Resilience Social Support  ADL's:  Intact  Cognition:  WNL   Physical Exam: Physical Exam Vitals reviewed.  HENT:     Head: Normocephalic.  Pulmonary:     Effort: Pulmonary effort is normal.  Neurological:     General: No focal deficit present.     Mental Status: She is alert.   Review of Systems  Respiratory:  Negative for shortness of breath.   Cardiovascular:  Negative for chest pain.  Gastrointestinal:  Negative for diarrhea, nausea and vomiting.  Blood pressure 118/82, pulse 88, temperature 97.8 F (36.6 C), temperature source Oral, resp. rate 16, height  (1.499 m), weight 74.8 kg, SpO2 100 %, unknown if currently breastfeeding. Body mass index is 33.33 kg/m.  Treatment Plan Summary: Diagnoses / Active Problems: MDD recurrent severe without psychotic features Panic attacks DM    PLAN: Safety and Monitoring:             -- Involuntary admission to inpatient psychiatric unit for safety, stabilization and treatment             -- Daily contact with patient to assess and evaluate symptoms and progress in treatment             -- Patient's case to be discussed in multi-disciplinary team meeting             -- Observation Level : q15 minute checks             -- Vital signs:  q12 hours             -- Precautions: suicide   2. Psychiatric Diagnoses and Treatment:              MDD recurrent severe without psychotic features Panic attacks -- Continue Prozac  daily - titrating up as tolerated             -- Trazodone  po qhs PRN insomnia             -- Vistaril   tid PRN anxiety             -- TSH 1.615             -- Encouraged patient to participate in unit milieu and in scheduled group therapies             -- Short Term Goals: Ability to verbalize feelings will improve, Ability to demonstrate self-control will improve, and Ability to identify and develop effective coping behaviors will improve             -- Long Term Goals: Improvement in symptoms so as ready for discharge              3. Medical Issues Being Addressed:             Tobacco Use Disorder             -- Nicotine patch 14mg /24 hours ordered PRN             -- Smoking cessation encouraged               DM             -- Per diabetic inpatient coordinator: continue Metformin 500mg  bid and Novolog moderate SSI tid with meals, Novolog 6 units tid with meals, and increase Levemir to 15u qam and 10unit qhs; FSBS today 276, 221, 320  -- HbgA1c pending              Mild thrombocytosis             -- Repeat platelet count 417 down from 439             -- will need recheck with PCP after discharge               Admission labs reviewed: WBC 8.8, H/H 12.6/39.1 and platelets 439; BMP WNL except for glucose 405; UDS negative; Pregnancy negative; Tylenol <10, ETOH <10, Salicylate <7, respiratory panel  negative.   4. Discharge Planning:             -- Social work and case management to assist with discharge planning and identification of hospital follow-up needs prior to discharge             -- Estimated LOS: 3-4 days             -- Discharge Concerns: Need to establish a safety plan; Medication compliance and effectiveness             -- Discharge Goals: Return home with outpatient referrals for mental health follow-up including medication management/psychotherapy   I certify that inpatient services furnished can reasonably be expected to improve the patient's condition.    , MD, FAPA 10/19/2020, 2:50 PM

## 2020-10-19 NOTE — Progress Notes (Signed)
Prn vistaril 25mg  given at 2104 and effective by 2200. Patient affect and mood is sad but improved.   Results for CHERRI, YERA (MRN Hedda Slade) as of 10/20/2020 01:09  Ref. Range 10/19/2020 20:54    Glucose-Capillary Latest Ref Range: 70 - 99 mg/dL 10/21/2020 (H)     923 [birth year]  Psych Admission Type (Psych Patients Only)  Admission Status Involuntary  Psychosocial Assessment  Patient Complaints Anxiety  Eye Contact Brief  Facial Expression Sad  Affect Sad;Depressed  Speech Soft;Slow  Interaction Minimal  Motor Activity Slow  Appearance/Hygiene Disheveled  Behavior Characteristics Cooperative  Mood Anxious  Thought Process  Coherency WDL  Content WDL  Delusions None reported or observed  Perception WDL  Hallucination None reported or observed  Judgment WDL  Confusion WDL  Danger to Self  Current suicidal ideation? Denies  Self-Injurious Behavior No self-injurious ideation or behavior indicators observed or expressed   Agreement Not to Harm Self Yes  Description of Agreement Verbal agreement to not harm herself  Danger to Others  Danger to Others None reported or observed

## 2020-10-19 NOTE — [facility] Suicide Risk Assessment (Signed)
[facility] INPATIENT:  Family/Significant Other Suicide Prevention Education  Suicide Prevention Education:  Education Completed; Madison Harrell,  (name of family member/significant other) has been identified by the patient as the family member/significant other with whom the patient will be residing, and identified as the person(s) who will aid the patient in the event of a mental health crisis (suicidal ideations/suicide attempt).  With written consent from the patient, the family member/significant other has been provided the following suicide prevention education, prior to the and/or following the discharge of the patient.  CSW called Madison Harrell, brother,  and discussed suicide prevention education. Mr. Madison Harrell reports that he and the patient talk about once a week.  Brother is not aware of patient having suicidal thoughts or declining mental health.  Brother reports that she has had depression in the past but that is the extent that he knows about her mental health. 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 Patient used to go to work prior to decision to becoming a stay at home mom.  To brother's knowledge, patient is safe at home and is not subject to physical abuse or any IPV.  Brother reports that patient would be able to return to her home in [place].  Brother also reports that she does not have access  to guns or weapons in the household.   The suicide prevention education provided includes the following: Suicide risk factors Suicide prevention and interventions National Suicide Hotline telephone number [facility] assessment telephone number [facility] Emergency Assistance 911 [facility] and/or Residential Mobile Crisis Unit telephone number  Request made of family/significant other to: Remove weapons (Madison.g., guns, rifles, knives), all items previously/currently identified as safety concern.   Remove  drugs/medications (over-the-counter, prescriptions, illicit drugs), all items previously/currently identified as a safety concern.  The family member/significant other verbalizes understanding of the suicide prevention education information provided.  The family member/significant other agrees to remove the items of safety concern listed above.  Madison Harrell Madison Harrell 10/19/2020, 1:12 PM

## 2020-10-19 NOTE — Progress Notes (Signed)
Adult Psychoeducational Group Note  Date:  10/19/2020 Time:  8:31 PM  Group Topic/Focus:  Wrap-Up Group:   The focus of this group is to help patients review their daily goal of treatment and discuss progress on daily workbooks.  Participation Level:  Active  Participation Quality:  Appropriate and Attentive  Affect:  Anxious  Cognitive:  Alert and Appropriate  Insight: Appropriate  Engagement in Group:  Developing/Improving  Modes of Intervention:  Discussion  Additional Comments:  pt discussed their goal and felt like they were improving.  [name] [name] 10/19/2020, 8:31 PM

## 2020-10-19 NOTE — Progress Notes (Signed)
Pt did not attend group. 

## 2020-10-19 NOTE — Progress Notes (Addendum)
Inpatient Diabetes Program Recommendations  AACE/ADA: New Consensus Statement on Inpatient Glycemic Control (2015)  Target Ranges:  Prepandial:   less than 140 mg/dL      Peak postprandial:   less than 180 mg/dL (1-2 hours)      Critically ill patients:  140 - 180 mg/dL   Lab Results  Component Value Date   GLUCAP 221 (H) 10/19/2020   HGBA1C 10.2 (H) 09/28/2019    Review of Glycemic Control Results for Madison Harrell, Madison Harrell (MRN [id number]) as of 10/19/2020 10:14  Ref. Range 10/18/2020 11:57 10/18/2020 17:06 10/18/2020 20:25 10/19/2020 00:16 10/19/2020 05:46  Glucose-Capillary Latest Ref Range: 70 - 99 mg/dL 638 (H) 466 (H) 599 (H) 276 (H) 221 (H)   Diabetes history: DM 2 Outpatient Diabetes medications:  Novolog 6 units tid with meals NPH 20 units in AM and NPH 10 units at bedtime Metformin 1000 mg daily Current orders for Inpatient glycemic control:  Novolog moderate tid with meals Novolog 6 units tid with meals Levemir 8 units daily Metformin 500 mg bid Inpatient Diabetes Program Recommendations:    Please add A1C to labs.  Also consider increasing Levemir to 10 units bid.  Will talk to patient today regarding her DM.   Thanks,  Beryl Meager, RN, BC-ADM Inpatient Diabetes Coordinator Pager [phone]  (8a-5p)  Addendum: Spoke to patient by phone regarding home management of DM. She clarified home medications and states she was taking insulin at home and has had diabetes since age 2.  She see's Dr. Donald Prose at [facility] and needs f/u appointment with him.  She admits to not checking her blood sugars as closely at home, but is interested in CGM possibly for checking blood sugars.  We discussed current options and I encouraged her to discuss with her MD b/c it could be helpful, especially with her having small children at home.  Encouraged patient to have close f/u and told her that we would like to have an A1C to see how her blood sugars are doing at home.  She states that when  she does check them, they are usually around 200's.  Discussed how erratic blood sugars also affect how one feels and the importance of control.  Encouraged patient as well.

## 2020-10-19 NOTE — [facility] Group Notes (Signed)
Type of Therapy and Topic:  Group Therapy:  Positive Affirmations   Participation Level:  Active  Description of Group: This group addressed positive affirmation toward self and others. Patients went around the room and identified two positive things about themselves and two positive things about a peer in the room. Patients reflected on how it felt to share something positive with others, to identify positive things about themselves, and to hear positive things from others. Patients were encouraged to have a daily reflection of positive characteristics or circumstances. Therapeutic Goals Patient will verbalize two of their positive qualities Patient will demonstrate empathy for others by stating two positive qualities about a peer in the group Patient will verbalize their feelings when voicing positive self affirmations and when voicing positive affirmations of others Patients will discuss the potential positive impact on their wellness/recovery of focusing on positive traits of self and others. Summary of Patient Progress: Madison Harrell attended the group and participated in the discussion with their peers. Madison Harrell states that one positive thing about her is that she can cook.  She did not share a positive affirmation.    Therapeutic Modalities Cognitive Behavioral Therapy Motivational Interviewing

## 2020-10-19 NOTE — Progress Notes (Signed)
Recreation Therapy Notes  Date:  6.24.22 Time: 0930 Location: [address] Dayroom   Group Topic: Stress Management   Goal Area(s) Addresses: Patient will identify positive stress management techniques. Patient will identify benefits of using stress management post d/c.   Intervention: Stress Management    Activity: Meditation.  LRT play a meditation for patients to calm and relax the body.     Education:  Stress Management, Discharge Planning.   Education Outcome: Acknowledges Education   Clinical Observations/Feedback:  LRT unable to do group due to fire drill and goals group running over time.     Florella Mcneese, LRT/CTRS         Mical Brun A 10/19/2020 10:50 AM 

## 2020-10-19 NOTE — Progress Notes (Signed)
  Results for Madison Harrell, Madison Harrell (MRN [id number]) as of 10/19/2020 02:05     Ref. Range  10/18/2020 20:25  10/19/2020 00:16   Glucose-Capillary Latest Ref Range: 70 - 99 mg/dL 892 (H) 119 (H)   Patient compliant with treatment denies SI/HI/A/VH and verbally contracted for safety. This shift she seems to have improved her mood and affect. HS Blood glucose was 309 Provider notified and recheck bg of 276 one time order of 2 units lispro ordered. Support and encouragement provided as needed. Will continue to monitor.

## 2020-10-20 LAB — GLUCOSE, CAPILLARY
Glucose-Capillary: 212 mg/dL — ABNORMAL HIGH (ref 70–99)
Glucose-Capillary: 224 mg/dL — ABNORMAL HIGH (ref 70–99)
Glucose-Capillary: 323 mg/dL — ABNORMAL HIGH (ref 70–99)
Glucose-Capillary: 275 mg/dL — ABNORMAL HIGH (ref 70–99)

## 2020-10-20 LAB — HEMOGLOBIN A1C
Hgb A1c MFr Bld: 11.3 % — ABNORMAL HIGH (ref 4.8–5.6)
Mean Plasma Glucose: 277.61 mg/dL

## 2020-10-20 MED ORDER — FLUOXETINE HCL 20 MG PO CAPS
20.0000 mg | ORAL_CAPSULE | Freq: Every day | ORAL | Status: DC
  Administered 2020-10-20 – 2020-10-22 (×3): 20 mg via ORAL
  Filled 2020-10-20 (×5): qty 1

## 2020-10-20 NOTE — [facility] Group Notes (Addendum)
Adult Psychoeducational Group Note  Date:  10/20/2020 Time:  10:29 AM  Group Topic/Focus:  Goals Group:   The focus of this group is to help patients establish daily goals to achieve during treatment and discuss how the patient can incorporate goal setting into their daily lives to aide in recovery.  Participation Level:  Active  Participation Quality:  Appropriate  Affect:  Appropriate  Cognitive:  Appropriate  Insight: Appropriate  Engagement in Group:  Engaged  Modes of Intervention:  Discussion  Additional Comments:  Patient attended coping skills group and participated.   Tori Dattilio W Terez Freimark 10/20/2020, 10:29 AM

## 2020-10-20 NOTE — Progress Notes (Signed)
The patient shared in group that she played cards with a peer today and laughed a great deal. Her goal for tomorrow is to continue to have fun and laugh with her peers.

## 2020-10-20 NOTE — Progress Notes (Signed)
Pt rates her anxiety and hopelessness both 3/10 and depression 4/10. Presents with flat affect, depressed mood on initial contact. However, pt's mood brightened up as shift progressed, observed to be animated but anxious. Denies SI, HI, AVH and pain at this time. Pt cooperative with CBG monitoring without issues. Attended scheduled groups and was engaged in all activities on and off unit.  Emotional support and encouragement offered to pt this shift. Q 15 minutes safety checks maintained without self harm gestures or outburst to note thus far. All medications given with verbal education and effects monitored.  Pt receptive to care. Tolerates all meals and medications well without discomfort. Remains safe on and off unit.

## 2020-10-20 NOTE — Progress Notes (Signed)
   10/20/20 2328  Psych Admission Type (Psych Patients Only)  Admission Status Involuntary  Psychosocial Assessment  Patient Complaints None  Eye Contact Fair  Facial Expression Anxious  Affect Appropriate to circumstance  Speech Logical/coherent  Interaction Assertive  Motor Activity Slow  Appearance/Hygiene Unremarkable  Behavior Characteristics Cooperative  Mood Pleasant  Thought Process  Coherency WDL  Content WDL  Delusions None reported or observed  Perception WDL  Hallucination None reported or observed  Judgment WDL  Confusion None  Danger to Self  Current suicidal ideation? Denies  Self-Injurious Behavior No self-injurious ideation or behavior indicators observed or expressed   Agreement Not to Harm Self Yes  Danger to Others  Danger to Others None reported or observed  D: Patient reports she had a good day. Pt stated she is tolerating medication well. A: Medications administered as prescribed. Support and encouragement provided as needed.  R: Patient remains safe on the unit. Will continue to monitor for safety and stability.

## 2020-10-20 NOTE — [facility] Group Notes (Signed)
LCSW Group Therapy Note  10/20/2020   10:00-11:00am   Type of Therapy and Topic:  Group Therapy: Anger Cues and Responses  Participation Level:  Active   Description of Group:   In this group, patients learned how to recognize the physical, cognitive, emotional, and behavioral responses they have to anger-provoking situations.  They identified a recent time they became angry and how they reacted.  They analyzed how their reaction was possibly beneficial and how it was possibly unhelpful.  The group discussed a variety of healthier coping skills that could help with such a situation in the future.  Focus was placed on how helpful it is to recognize the underlying emotions to our anger, because working on those can lead to a more permanent solution as well as our ability to focus on the important rather than the urgent.  Therapeutic Goals: Patients will remember their last incident of anger and how they felt emotionally and physically, what their thoughts were at the time, and how they behaved. Patients will identify how their behavior at that time worked for them, as well as how it worked against them. Patients will explore possible new behaviors to use in future anger situations. Patients will learn that anger itself is normal and cannot be eliminated, and that healthier reactions can assist with resolving conflict rather than worsening situations.  Summary of Patient Progress:  The patient was provided with the following information:  That anger is a natural part of human life.  That people can acquire effective coping skills and work toward having positive outcomes.  The patient now understands that there emotional and physical cues associated with anger and that these can be used as warning signs alert them to step-back, regroup and use a coping skill.  Patient was encouraged to work on managing anger more effectively.  Therapeutic Modalities:   Cognitive Behavioral Therapy  Evorn Gong

## 2020-10-20 NOTE — Progress Notes (Signed)
[facility] MD Progress Note  10/20/2020 7:20 AM Britainy Harrell  MRN:  [id number]  Subjective: Madison Harrell is a 30 y.o. female with a self-reported h/o depression, who was admitted under IVC from [facility] ED for worsening depression and suicidal ideation. The patient reportedly was brought to the ED by her husband after attempting to cut herself with a kitchen knife and later trying to choke herself with an electrical cord.The patient is currently on Hospital Day 3.   Chart Review from last 24 hours:  The patient's chart was reviewed and nursing notes were reviewed. The patient's case was discussed in multidisciplinary team meeting. Per nursing, she has attended groups and been visible in the milieu. She has had no acute safety concerns or behavioral issues noted. She has appeared sad. Per MAR she has been compliant with scheduled medications and did receive Vistaril X1 for anxiety. Slept 6.25 hours.  Information Obtained Today During Patient Interview: The patient was seen and evaluated on the unit. The patient states she has talked with her brother and children and is missing her family. She plans to live with her mother and brother and children after discharge because she states they need to move out of the place they are currently staying. She is not sure if her husband will live with them after the move and is still undecided about the status of her marriage. She states she is feeling less depressed today and denies medication side-effects. She denies SI, HI, AVH, paranoia, or delusions. She states her sleep is improving and her appetite is good. She admits she is feeling anxious when she thinks about the impact of her depression on her family and supportive therapy was provided.   Principal Problem: Severe recurrent major depression without psychotic features (HCC) Diagnosis: Principal Problem:   Severe recurrent major depression without psychotic features (HCC) Active Problems:   Diabetes  (HCC)  Total Time Spent in Direct Patient Care:  I personally spent 30 minutes on the unit in direct patient care. The direct patient care time included face-to-face time with the patient, reviewing the patient's chart, communicating with other professionals, and coordinating care. Greater than 50% of this time was spent in counseling or coordinating care with the patient regarding goals of hospitalization, psycho-education, and discharge planning needs.  Past Psychiatric History: see admission H&P  Past Medical History:  Past Medical History:  Diagnosis Date   Diabetes mellitus without complication (HCC)    pre E     Past Surgical History:  Procedure Laterality Date   CESAREAN SECTION  2013   Family History:  Family History  Problem Relation Age of Onset   Diabetes Father    Family Psychiatric  History: see admission H&P  Social History:  Social History   Substance and Sexual Activity  Alcohol Use No     Social History   Substance and Sexual Activity  Drug Use No    Social History   Socioeconomic History   Marital status: Married    Spouse name: Not on file   Number of children: 2   Years of education: Not on file   Highest education level: Not on file  Occupational History   Not on file  Tobacco Use   Smoking status: Former    Pack years: 0.00    Types: Cigarettes   Smokeless tobacco: Never  Vaping Use   Vaping Use: Never used  Substance and Sexual Activity   Alcohol use: No   Drug use: No   Sexual  activity: Yes    Birth control/protection: None  Other Topics Concern   Not on file  Social History Narrative   Not on file   Social Determinants of Health   Financial Resource Strain: Not on file  Food Insecurity: Not on file  Transportation Needs: Not on file  Physical Activity: Not on file  Stress: Not on file  Social Connections: Not on file   Sleep: Good  Appetite:  Good  Current Medications: Current Facility-Administered Medications   Medication Dose Route Frequency Provider Last Rate Last Admin   acetaminophen (TYLENOL) tablet 650 mg  650 mg Oral Q6H PRN Clapacs, Jackquline Denmark, MD       alum & mag hydroxide-simeth (MAALOX/MYLANTA) 200-200-20 MG/5ML suspension 30 mL  30 mL Oral Q4H PRN Clapacs, Jackquline Denmark, MD       FLUoxetine (PROZAC) capsule 10 mg  10 mg Oral Daily Mason Jim, Gaylen Pereira E, MD   10 mg at 10/19/20 0800   hydrOXYzine (ATARAX/VISTARIL) tablet 25 mg  25 mg Oral TID PRN Comer Locket, MD   25 mg at 10/19/20 2104   insulin aspart (novoLOG) injection 0-15 Units  0-15 Units Subcutaneous TID WC Clapacs, Jackquline Denmark, MD   5 Units at 10/20/20 0635   insulin aspart (novoLOG) injection 6 Units  6 Units Subcutaneous TID WC Clapacs, Jackquline Denmark, MD   6 Units at 10/20/20 8338   insulin detemir (LEVEMIR) injection 10 Units  10 Units Subcutaneous QHS Comer Locket, MD   10 Units at 10/19/20 2100   insulin detemir (LEVEMIR) injection 15 Units  15 Units Subcutaneous Daily Mason Jim, Khrystyne Arpin E, MD       magnesium hydroxide (MILK OF MAGNESIA) suspension 30 mL  30 mL Oral Daily PRN Clapacs, Jackquline Denmark, MD       metFORMIN (GLUCOPHAGE) tablet 500 mg  500 mg Oral BID WC Mason Jim, Tinzlee Craker E, MD   500 mg at 10/19/20 1719   nicotine (NICODERM CQ - dosed in mg/24 hours) patch 14 mg  14 mg Transdermal Daily PRN Comer Locket, MD       traZODone (DESYREL) tablet 50 mg  50 mg Oral QHS PRN Comer Locket, MD        Lab Results:  Results for orders placed or performed during the hospital encounter of 10/17/20 (from the past 48 hour(s))  Glucose, capillary     Status: Abnormal   Collection Time: 10/18/20 11:57 AM  Result Value Ref Range   Glucose-Capillary 272 (H) 70 - 99 mg/dL    Comment: Glucose reference range applies only to samples taken after fasting for at least 8 hours.  Glucose, capillary     Status: Abnormal   Collection Time: 10/18/20  5:06 PM  Result Value Ref Range   Glucose-Capillary 342 (H) 70 - 99 mg/dL    Comment: Glucose reference range applies  only to samples taken after fasting for at least 8 hours.  Glucose, capillary     Status: Abnormal   Collection Time: 10/18/20  8:25 PM  Result Value Ref Range   Glucose-Capillary 309 (H) 70 - 99 mg/dL    Comment: Glucose reference range applies only to samples taken after fasting for at least 8 hours.   Comment 1 Notify RN   Glucose, capillary     Status: Abnormal   Collection Time: 10/19/20 12:16 AM  Result Value Ref Range   Glucose-Capillary 276 (H) 70 - 99 mg/dL    Comment: Glucose reference range applies only to samples  taken after fasting for at least 8 hours.   Comment 1 Notify RN    Comment 2 Document in Chart   Glucose, capillary     Status: Abnormal   Collection Time: 10/19/20  5:46 AM  Result Value Ref Range   Glucose-Capillary 221 (H) 70 - 99 mg/dL    Comment: Glucose reference range applies only to samples taken after fasting for at least 8 hours.   Comment 1 Notify RN   TSH     Status: None   Collection Time: 10/19/20  6:28 AM  Result Value Ref Range   TSH 1.615 0.350 - 4.500 uIU/mL    Comment: Performed by a 3rd Generation assay with a functional sensitivity of <=0.01 uIU/mL. Performed at [facility], [address] [place], [place] [address]   CBC with Differential/Platelet     Status: Abnormal   Collection Time: 10/19/20  6:28 AM  Result Value Ref Range   WBC 7.1 4.0 - 10.5 K/uL   RBC 5.19 (H) 3.87 - 5.11 MIL/uL   Hemoglobin 11.7 (L) 12.0 - 15.0 g/dL   HCT 60.439.2 54.036.0 - 98.146.0 %   MCV 75.5 (L) 80.0 - 100.0 fL   MCH 22.5 (L) 26.0 - 34.0 pg   MCHC 29.8 (L) 30.0 - 36.0 g/dL   RDW 19.118.4 (H) 47.811.5 - 29.515.5 %   Platelets 417 (H) 150 - 400 K/uL   nRBC 0.0 0.0 - 0.2 %   Neutrophils Relative % 42 %   Neutro Abs 3.0 1.7 - 7.7 K/uL   Lymphocytes Relative 48 %   Lymphs Abs 3.3 0.7 - 4.0 K/uL   Monocytes Relative 7 %   Monocytes Absolute 0.5 0.1 - 1.0 K/uL   Eosinophils Relative 3 %   Eosinophils Absolute 0.2 0.0 - 0.5 K/uL   Basophils Relative 0 %    Basophils Absolute 0.0 0.0 - 0.1 K/uL   Immature Granulocytes 0 %   Abs Immature Granulocytes 0.03 0.00 - 0.07 K/uL    Comment: Performed at [facility], [address] [place], [place] [address]  Hemoglobin A1c     Status: Abnormal   Collection Time: 10/19/20  6:28 AM  Result Value Ref Range   Hgb A1c MFr Bld 11.3 (H) 4.8 - 5.6 %    Comment: (NOTE) Pre diabetes:          5.7%-6.4%  Diabetes:              >6.4%  Glycemic control for   <7.0% adults with diabetes    Mean Plasma Glucose 277.61 mg/dL    Comment: Performed at [facility], [address] [place], [place] [address]  Glucose, capillary     Status: Abnormal   Collection Time: 10/19/20 11:53 AM  Result Value Ref Range   Glucose-Capillary 320 (H) 70 - 99 mg/dL    Comment: Glucose reference range applies only to samples taken after fasting for at least 8 hours.  Glucose, capillary     Status: Abnormal   Collection Time: 10/19/20  5:04 PM  Result Value Ref Range   Glucose-Capillary 262 (H) 70 - 99 mg/dL    Comment: Glucose reference range applies only to samples taken after fasting for at least 8 hours.  Glucose, capillary     Status: Abnormal   Collection Time: 10/19/20  8:54 PM  Result Value Ref Range   Glucose-Capillary 338 (H) 70 - 99 mg/dL    Comment: Glucose reference range applies  only to samples taken after fasting for at least 8 hours.  Glucose, capillary     Status: Abnormal   Collection Time: 10/20/20  6:13 AM  Result Value Ref Range   Glucose-Capillary 212 (H) 70 - 99 mg/dL    Comment: Glucose reference range applies only to samples taken after fasting for at least 8 hours.   Comment 1 Notify RN    Comment 2 Document in Chart     Blood Alcohol level:  Lab Results  Component Value Date   ETH <10 10/17/2020    Metabolic Disorder Labs: Lab Results  Component Value Date   HGBA1C 11.3 (H) 10/19/2020   MPG 277.61 10/19/2020   MPG 298 05/02/2019   No results found for:  PROLACTIN No results found for: CHOL, TRIG, HDL, CHOLHDL, VLDL, LDLCALC  Physical Findings: AIMS: Facial and Oral Movements Muscles of Facial Expression: None, normal Lips and Perioral Area: None, normal Jaw: None, normal Tongue: None, normal,Extremity Movements Upper (arms, wrists, hands, fingers): None, normal Lower (legs, knees, ankles, toes): None, normal, Trunk Movements Neck, shoulders, hips: None, normal, Overall Severity Severity of abnormal movements (highest score from questions above): None, normal Incapacitation due to abnormal movements: None, normal Patient's awareness of abnormal movements (rate only patient's report): No Awareness, Dental Status Current problems with teeth and/or dentures?: No Does patient usually wear dentures?: No     Musculoskeletal: Strength & Muscle Tone: within normal limits Gait & Station: normal, steady Patient leans: N/A  General Appearance:  casually dressed, adequate hygiene  Eye Contact:  Good  Speech:  Clear and Coherent and Normal Rate  Volume:  Normal  Mood:  dysphoric  Affect:  constricted but no longer tearful  Thought Process:  goal directed and linear  Orientation:  Full (Time, Place, and Person)  Thought Content:  Logical and denies AVH, paranoia, or delusions  Suicidal Thoughts:   Denied  Homicidal Thoughts:  No  Memory:  Recent;   Good  Judgement:  improving  Insight:  improving  Psychomotor Activity:  Normal  Concentration:  Concentration: Good and Attention Span: Good  Recall:  Good  Fund of Knowledge:  Good  Language:  Good  Akathisia:  Negative  Assets:  Communication Skills Desire for Improvement Housing Resilience Social Support  ADL's:  Intact  Cognition:  WNL   Physical Exam: Physical Exam Vitals reviewed.  HENT:     Head: Normocephalic.  Pulmonary:     Effort: Pulmonary effort is normal.  Neurological:     General: No focal deficit present.     Mental Status: She is alert.   Review of Systems   Respiratory:  Negative for shortness of breath.   Cardiovascular:  Negative for chest pain.  Gastrointestinal:  Negative for diarrhea, nausea and vomiting.  Blood pressure 117/79, pulse 86, temperature 98 F (36.7 C), temperature source Oral, resp. rate 16, height  (1.499 m), weight 74.8 kg, SpO2 100 %, unknown if currently breastfeeding. Body mass index is 33.33 kg/m.  Treatment Plan Summary: Diagnoses / Active Problems: MDD recurrent severe without psychotic features Panic attacks DM   PLAN: Safety and Monitoring:             -- Involuntary admission to inpatient psychiatric unit for safety, stabilization and treatment             -- Daily contact with patient to assess and evaluate symptoms and progress in treatment             -- Patient's  case to be discussed in multi-disciplinary team meeting             -- Observation Level : q15 minute checks             -- Vital signs:  q12 hours             -- Precautions: suicide   2. Psychiatric Diagnoses and Treatment:              MDD recurrent severe without psychotic features Panic attacks -- Increase Prozac to 20mg  daily for residual mood symptoms             -- Trazodone 50mg  po qhs PRN insomnia             -- Vistaril 25mg  tid PRN anxiety             -- Encouraged patient to participate in unit milieu and in scheduled group therapies             -- Short Term Goals: Ability to verbalize feelings will improve, Ability to demonstrate self-control will improve, and Ability to identify and develop effective coping behaviors will improve             -- Long Term Goals: Improvement in symptoms so as ready for discharge              3. Medical Issues Being Addressed:             Tobacco Use Disorder             -- Nicotine patch 14mg /24 hours ordered PRN             -- Smoking cessation encouraged               DM             -- Per diabetic inpatient coordinator: continue Metformin 500mg  bid and Novolog moderate SSI tid with  meals, Novolog 6 units tid with meals, and increase Levemir to 15u qam and 10unit qhs; FSBS 212 this morning  -- HbgA1c 11.3              Mild thrombocytosis             -- Repeat platelet count 417 down from 439             -- will need recheck with PCP after discharge               Admission labs reviewed: WBC 8.8, H/H 12.6/39.1 and platelets 439; BMP WNL except for glucose 405; UDS negative; Pregnancy negative; Tylenol <10, ETOH <10, Salicylate <7, respiratory panel negative.   4. Discharge Planning:             -- Social work and case management to assist with discharge planning and identification of hospital follow-up needs prior to discharge             -- Estimated LOS: 2-3 days             -- Discharge Concerns: Need to establish a safety plan; Medication compliance and effectiveness             -- Discharge Goals: Return home with outpatient referrals for mental health follow-up including medication management/psychotherapy   I certify that inpatient services furnished can reasonably be expected to improve the patient's condition.    , MD, FAPA 10/20/2020, 7:20 AM

## 2020-10-21 LAB — GLUCOSE, CAPILLARY
Glucose-Capillary: 225 mg/dL — ABNORMAL HIGH (ref 70–99)
Glucose-Capillary: 269 mg/dL — ABNORMAL HIGH (ref 70–99)
Glucose-Capillary: 296 mg/dL — ABNORMAL HIGH (ref 70–99)
Glucose-Capillary: 241 mg/dL — ABNORMAL HIGH (ref 70–99)

## 2020-10-21 NOTE — [facility] Group Notes (Signed)
Patient did not attend morning goals group, although she was made aware of it.  

## 2020-10-21 NOTE — [facility] Group Notes (Signed)
[facility] Group Notes:  (Nursing/MHT/Case Management/Adjunct)  Date:  10/21/2020  Time:  5:23 PM  Type of Therapy:  Relaxation therapy  Participation Level:  Did Not Attend  Participation Quality:    Affect:    Cognitive:    Insight:    Engagement in Group:    Modes of Intervention:  Activity  Summary of Progress/Problems:  Did not attend despite staff invitation.    Jeana Kersting V Frederich Montilla 10/21/2020, 5:23 PM 

## 2020-10-21 NOTE — [facility] Group Notes (Signed)
[facility] LCSW Group Therapy Note  Date/Time:  10/21/2020 9:00-10:00 or 10:00-11:00AM  Type of Therapy and Topic:  Group Therapy:  Healthy and Unhealthy Supports  Participation Level:  Did Not Attend   Description of Group:  Patients in this group were introduced to the idea of adding a variety of healthy supports to address the various needs in their lives.Patients discussed what additional healthy supports could be helpful in their recovery and wellness after discharge in order to prevent future hospitalizations.   An emphasis was placed on using counselor, doctor, therapy groups, 12-step groups, and problem-specific support groups to expand supports.  They also worked as a group on developing a specific plan for several patients to deal with unhealthy supports through boundary-setting, psychoeducation with loved ones, and even termination of relationships.   Therapeutic Goals:   1)  discuss importance of adding supports to stay well once out of the hospital  2)  compare healthy versus unhealthy supports and identify some examples of each  3)  generate ideas and descriptions of healthy supports that can be added  4)  offer mutual support about how to address unhealthy supports  5)  encourage active participation in and adherence to discharge plan    Summary of Patient Progress:  did not attend   Therapeutic Modalities:   Motivational Interviewing Brief Solution-Focused Therapy  Delenn Ahn D Dezarae Mcclaran        

## 2020-10-21 NOTE — Progress Notes (Addendum)
   10/21/20 1052  Psych Admission Type (Psych Patients Only)  Admission Status Involuntary  Psychosocial Assessment  Patient Complaints None  Eye Contact Fair  Facial Expression Anxious  Affect Appropriate to circumstance  Speech Logical/coherent  Interaction Assertive  Motor Activity Slow  Appearance/Hygiene Unremarkable  Behavior Characteristics Cooperative;Appropriate to situation  Mood Pleasant  Thought Process  Coherency WDL  Content WDL  Delusions None reported or observed  Perception WDL  Hallucination None reported or observed  Judgment WDL  Confusion None  Danger to Self  Current suicidal ideation? Denies  Self-Injurious Behavior No self-injurious ideation or behavior indicators observed or expressed   Agreement Not to Harm Self Yes  Description of Agreement Verbal agreement to not harm herself  Danger to Others  Danger to Others None reported or observed   D. Patient presents with a much brighter affect- observed in the milieu interacting well with peers and staff. Per pt's self inventory, pt rated her depression, hopelessness and anxiety a 4/4/3, respectively. Pt wrote that her goal today is "to keep the negative thoughts away" and "keep socializing".  Pt currently denies SI/HI and AVH .  A. Labs and vitals monitored. Pt given and educated on medications. Pt supported emotionally and encouraged to express concerns and ask questions.   R. Pt remains safe with 15 minute checks. Will continue POC.

## 2020-10-21 NOTE — Progress Notes (Signed)
[facility] MD Progress Note  10/21/2020 3:48 PM Alianny Toelle  MRN:  [id number]  Subjective: Madison Harrell is a 30 y.o. female with a self-reported h/o depression, who was admitted under IVC from [facility] ED for worsening depression and suicidal ideation. The patient reportedly was brought to the ED by her husband after attempting to cut herself with a kitchen knife and later trying to choke herself with an electrical cord.The patient is currently on Hospital Day 4.   Chart Review from last 24 hours:  The patient's chart was reviewed and nursing notes were reviewed. The patient's case was discussed in multidisciplinary team meeting. Per nursing, she has attended groups and has been visible in the milieu. She has had no acute safety concerns or behavioral issues noted. Per MAR she was compliant with scheduled medications and received Trazodone X1 for sleep.   Information Obtained Today During Patient Interview: The patient was seen and evaluated on the unit. She states she talked to her husband and found out that his grandmother passed away and the funeral is today. She admits this has made her sad that she could not attend the funeral. She reports that her conversation with her husband overall went well and they were able to work out arrangements to live together after discharge with their kids and the patient's mother while they work on their marriage. She expressed her feelings to her husband about their relationship stressors "being a trigger" for her and she states she advocated for herself by asking for help with the kids once she is home. She would like to go tomorrow and states she feels improved in terms of her mood. She denies medication side-effects and denies SI or HI. She denies AVH, paranoia, or delusions. She states her sleep and appetite have been good and she voices no physical complaints. She agrees to reschedule with endocrinology after discharge and we discussed her current inpatient DM  regimen. Time was given for questions.   Principal Problem: Severe recurrent major depression without psychotic features (HCC) Diagnosis: Principal Problem:   Severe recurrent major depression without psychotic features (HCC) Active Problems:   Diabetes (HCC)  Total Time Spent in Direct Patient Care:  I personally spent 25 minutes on the unit in direct patient care. The direct patient care time included face-to-face time with the patient, reviewing the patient's chart, communicating with other professionals, and coordinating care. Greater than 50% of this time was spent in counseling or coordinating care with the patient regarding goals of hospitalization, psycho-education, and discharge planning needs.  Past Psychiatric History: see admission H&P  Past Medical History:  Past Medical History:  Diagnosis Date   Diabetes mellitus without complication (HCC)    pre E     Past Surgical History:  Procedure Laterality Date   CESAREAN SECTION  2013   Family History:  Family History  Problem Relation Age of Onset   Diabetes Father    Family Psychiatric  History: see admission H&P  Social History:  Social History   Substance and Sexual Activity  Alcohol Use No     Social History   Substance and Sexual Activity  Drug Use No    Social History   Socioeconomic History   Marital status: Married    Spouse name: Not on file   Number of children: 2   Years of education: Not on file   Highest education level: Not on file  Occupational History   Not on file  Tobacco Use   Smoking status: Former  Pack years: 0.00    Types: Cigarettes   Smokeless tobacco: Never  Vaping Use   Vaping Use: Never used  Substance and Sexual Activity   Alcohol use: No   Drug use: No   Sexual activity: Yes    Birth control/protection: None  Other Topics Concern   Not on file  Social History Narrative   Not on file   Social Determinants of Health   Financial Resource Strain: Not on file   Food Insecurity: Not on file  Transportation Needs: Not on file  Physical Activity: Not on file  Stress: Not on file  Social Connections: Not on file   Sleep: Good  Appetite:  Good  Current Medications: Current Facility-Administered Medications  Medication Dose Route Frequency Provider Last Rate Last Admin   acetaminophen (TYLENOL) tablet 650 mg  650 mg Oral Q6H PRN Clapacs, John T, MD       alum & mag hydroxide-simeth (MAALOX/MYLANTA) 200-200-20 MG/5ML suspension 30 mL  30 mL Oral Q4H PRN Clapacs, John T, MD       FLUoxetine (PROZAC) capsule 20 mg  20 mg Oral Daily Mason Jim, Laretta Pyatt E, MD   20 mg at 10/21/20 0740   hydrOXYzine (ATARAX/VISTARIL) tablet 25 mg  25 mg Oral TID PRN Comer Locket, MD   25 mg at 10/19/20 2104   insulin aspart (novoLOG) injection 0-15 Units  0-15 Units Subcutaneous TID WC Clapacs, John T, MD   8 Units at 10/21/20 1214   insulin aspart (novoLOG) injection 6 Units  6 Units Subcutaneous TID WC Clapacs, Jackquline Denmark, MD   6 Units at 10/21/20 1213   insulin detemir (LEVEMIR) injection 10 Units  10 Units Subcutaneous QHS Comer Locket, MD   10 Units at 10/20/20 2136   insulin detemir (LEVEMIR) injection 15 Units  15 Units Subcutaneous Daily Comer Locket, MD   15 Units at 10/21/20 0743   magnesium hydroxide (MILK OF MAGNESIA) suspension 30 mL  30 mL Oral Daily PRN Clapacs, John T, MD       metFORMIN (GLUCOPHAGE) tablet 500 mg  500 mg Oral BID WC Mason Jim, Mayling Aber E, MD   500 mg at 10/21/20 0740   nicotine (NICODERM CQ - dosed in mg/24 hours) patch 14 mg  14 mg Transdermal Daily PRN Comer Locket, MD       traZODone (DESYREL) tablet 50 mg  50 mg Oral QHS PRN Comer Locket, MD   50 mg at 10/20/20 2134    Lab Results:  Results for orders placed or performed during the hospital encounter of 10/17/20 (from the past 48 hour(s))  Glucose, capillary     Status: Abnormal   Collection Time: 10/19/20  5:04 PM  Result Value Ref Range   Glucose-Capillary 262 (H) 70 - 99  mg/dL    Comment: Glucose reference range applies only to samples taken after fasting for at least 8 hours.  Glucose, capillary     Status: Abnormal   Collection Time: 10/19/20  8:54 PM  Result Value Ref Range   Glucose-Capillary 338 (H) 70 - 99 mg/dL    Comment: Glucose reference range applies only to samples taken after fasting for at least 8 hours.  Glucose, capillary     Status: Abnormal   Collection Time: 10/20/20  6:13 AM  Result Value Ref Range   Glucose-Capillary 212 (H) 70 - 99 mg/dL    Comment: Glucose reference range applies only to samples taken after fasting for at least 8 hours.  Comment 1 Notify RN    Comment 2 Document in Chart   Glucose, capillary     Status: Abnormal   Collection Time: 10/20/20 11:44 AM  Result Value Ref Range   Glucose-Capillary 224 (H) 70 - 99 mg/dL    Comment: Glucose reference range applies only to samples taken after fasting for at least 8 hours.   Comment 1 Notify RN   Glucose, capillary     Status: Abnormal   Collection Time: 10/20/20  5:06 PM  Result Value Ref Range   Glucose-Capillary 323 (H) 70 - 99 mg/dL    Comment: Glucose reference range applies only to samples taken after fasting for at least 8 hours.  Glucose, capillary     Status: Abnormal   Collection Time: 10/20/20  8:38 PM  Result Value Ref Range   Glucose-Capillary 275 (H) 70 - 99 mg/dL    Comment: Glucose reference range applies only to samples taken after fasting for at least 8 hours.  Glucose, capillary     Status: Abnormal   Collection Time: 10/21/20  6:02 AM  Result Value Ref Range   Glucose-Capillary 225 (H) 70 - 99 mg/dL    Comment: Glucose reference range applies only to samples taken after fasting for at least 8 hours.   Comment 1 Notify RN    Comment 2 Document in Chart   Glucose, capillary     Status: Abnormal   Collection Time: 10/21/20 11:28 AM  Result Value Ref Range   Glucose-Capillary 269 (H) 70 - 99 mg/dL    Comment: Glucose reference range applies only  to samples taken after fasting for at least 8 hours.   Comment 1 Notify RN     Blood Alcohol level:  Lab Results  Component Value Date   ETH <10 10/17/2020    Metabolic Disorder Labs: Lab Results  Component Value Date   HGBA1C 11.3 (H) 10/19/2020   MPG 277.61 10/19/2020   MPG 298 05/02/2019   No results found for: PROLACTIN No results found for: CHOL, TRIG, HDL, CHOLHDL, VLDL, LDLCALC  Physical Findings: AIMS: Facial and Oral Movements Muscles of Facial Expression: None, normal Lips and Perioral Area: None, normal Jaw: None, normal Tongue: None, normal,Extremity Movements Upper (arms, wrists, hands, fingers): None, normal Lower (legs, knees, ankles, toes): None, normal, Trunk Movements Neck, shoulders, hips: None, normal, Overall Severity Severity of abnormal movements (highest score from questions above): None, normal Incapacitation due to abnormal movements: None, normal Patient's awareness of abnormal movements (rate only patient's report): No Awareness, Dental Status Current problems with teeth and/or dentures?: No Does patient usually wear dentures?: No     Musculoskeletal: Strength & Muscle Tone: within normal limits Gait & Station: normal, steady Patient leans: N/A  General Appearance:  casually dressed, good hygiene  Eye Contact:  Good  Speech:  Clear and Coherent and Normal Rate  Volume:  Normal  Mood:  described as improved - appears more euthymic  Affect:  brighter, calm, polite  Thought Process:  goal directed and linear. Future oriented  Orientation:  Full (Time, Place, and Person)  Thought Content:  Logical and denies AVH, paranoia, or delusions  Suicidal Thoughts:   Denied  Homicidal Thoughts:  Denied  Memory:  Recent;   Good  Judgement:  improving  Insight:  improving  Psychomotor Activity:  Normal  Concentration:  Concentration: Good and Attention Span: Good  Recall:  Good  Fund of Knowledge:  Good  Language:  Good  Akathisia:  Negative   Assets:  Communication Skills Desire for Improvement Housing Resilience Social Support  ADL's:  Intact  Cognition:  WNL   Physical Exam: Physical Exam Vitals reviewed.  HENT:     Head: Normocephalic.  Pulmonary:     Effort: Pulmonary effort is normal.  Neurological:     General: No focal deficit present.     Mental Status: She is alert.   Review of Systems  Respiratory:  Negative for shortness of breath.   Cardiovascular:  Negative for chest pain.  Gastrointestinal:  Negative for diarrhea, nausea and vomiting.  Blood pressure 117/81, pulse 89, temperature 97.7 F (36.5 C), temperature source Oral, resp. rate 16, height 4\' 11"  (1.499 m), weight 74.8 kg, SpO2 100 %, unknown if currently breastfeeding. Body mass index is 33.33 kg/m.  Treatment Plan Summary: Diagnoses / Active Problems: MDD recurrent severe without psychotic features Panic attacks DM   PLAN: Safety and Monitoring:             -- Involuntary admission to inpatient psychiatric unit for safety, stabilization and treatment             -- Daily contact with patient to assess and evaluate symptoms and progress in treatment             -- Patient's case to be discussed in multi-disciplinary team meeting             -- Observation Level : q15 minute checks             -- Vital signs:  q12 hours             -- Precautions: suicide   2. Psychiatric Diagnoses and Treatment:              MDD recurrent severe without psychotic features Panic attacks -- Continue Prozac 20mg  daily for residual mood symptoms             -- Trazodone 50mg  po qhs PRN insomnia             -- Vistaril 25mg  tid PRN anxiety             -- Encouraged patient to participate in unit milieu and in scheduled group therapies             -- Short Term Goals: Ability to verbalize feelings will improve, Ability to demonstrate self-control will improve, and Ability to identify and develop effective coping behaviors will improve             -- Long  Term Goals: Improvement in symptoms so as ready for discharge              3. Medical Issues Being Addressed:             Tobacco Use Disorder             -- Nicotine patch 14mg /24 hours ordered PRN             -- Smoking cessation encouraged               DM             -- Per diabetic inpatient coordinator: continue Metformin 500mg  bid and Novolog moderate SSI tid with meals, Novolog 6 units tid with meals, and Levemir 15u qam and 10unit qhs; FSBS 212 this morning  -- HbgA1c 11.3              Mild thrombocytosis             --  Repeat platelet count 417 down from 439             -- will need recheck with PCP after discharge               Admission labs reviewed: WBC 8.8, H/H 12.6/39.1 and platelets 439; BMP WNL except for glucose 405; UDS negative; Pregnancy negative; Tylenol <10, ETOH <10, Salicylate <7, respiratory panel negative.   4. Discharge Planning:             -- Social work and case management to assist with discharge planning and identification of hospital follow-up needs prior to discharge             -- Estimated LOS: 1-2 days             -- Discharge Concerns: Need to establish a safety plan; Medication compliance and effectiveness             -- Discharge Goals: Return home with outpatient referrals for mental health follow-up including medication management/psychotherapy   I certify that inpatient services furnished can reasonably be expected to improve the patient's condition.    Comer Locket, MD, FAPA 10/21/2020, 3:48 PM

## 2020-10-21 NOTE — Progress Notes (Signed)
Inpatient Diabetes Program Recommendations  AACE/ADA: New Consensus Statement on Inpatient Glycemic Control (2015)  Target Ranges:  Prepandial:   less than 140 mg/dL      Peak postprandial:   less than 180 mg/dL (1-2 hours)      Critically ill patients:  140 - 180 mg/dL   Lab Results  Component Value Date   GLUCAP 225 (H) 10/21/2020   HGBA1C 11.3 (H) 10/19/2020    Review of Glycemic Control Results for CHELCY, BOLDA (MRN [id number]) as of 10/21/2020 10:04  Ref. Range 10/20/2020 06:13 10/20/2020 11:44 10/20/2020 17:06 10/20/2020 20:38 10/21/2020 06:02  Glucose-Capillary Latest Ref Range: 70 - 99 mg/dL 416 (H) 606 (H) 301 (H) 275 (H) 225 (H)    Inpatient Diabetes Program Recommendations:    Levemir 20 QAM & 15 QHS Novolog 8 units TID with meals if consumes at least 50%  Will continue to follow while inpatient.  Thank you, Dulce Sellar, RN, BSN Diabetes Coordinator Inpatient Diabetes Program [phone] (team pager from 8a-5p)

## 2020-10-21 NOTE — [facility] Group Notes (Signed)
[facility] Group Notes:  (Nursing/MHT/Case Management/Adjunct)  Date:  10/21/2020  Time:  2:41 PM  Type of Therapy:  Nurse Education  Participation Level:  Active  Participation Quality:  Appropriate and Attentive  Affect:  Appropriate  Cognitive:  Alert and Appropriate  Insight:  Improving  Engagement in Group:  Engaged  Modes of Intervention:  Discussion and Education  Summary of Progress/Problems:  Discussed sleep hygiene.  Handout provided.  She was active in the group conversation.  Norm Parcel Sharine Cadle 10/21/2020, 2:41 PM

## 2020-10-22 LAB — GLUCOSE, CAPILLARY
Glucose-Capillary: 231 mg/dL — ABNORMAL HIGH (ref 70–99)
Glucose-Capillary: 316 mg/dL — ABNORMAL HIGH (ref 70–99)

## 2020-10-22 MED ORDER — TRAZODONE HCL 50 MG PO TABS: 50.0000 mg | ORAL_TABLET | Freq: Every evening | ORAL | 0 refills | Status: DC | PRN

## 2020-10-22 MED ORDER — HYDROXYZINE HCL 25 MG PO TABS: 25.0000 mg | ORAL_TABLET | Freq: Three times a day (TID) | ORAL | 0 refills | Status: DC | PRN

## 2020-10-22 MED ORDER — INSULIN DETEMIR 100 UNIT/ML ~~LOC~~ SOLN: 10.0000 [IU] | Freq: Every day | SUBCUTANEOUS | 0 refills | Status: DC

## 2020-10-22 MED ORDER — NICOTINE 14 MG/24HR TD PT24: 14.0000 mg | MEDICATED_PATCH | Freq: Every day | TRANSDERMAL | 0 refills | Status: DC | PRN

## 2020-10-22 MED ORDER — FLUOXETINE HCL 20 MG PO CAPS: 20.0000 mg | ORAL_CAPSULE | Freq: Every day | ORAL | 0 refills | Status: DC

## 2020-10-22 MED ORDER — METFORMIN HCL 500 MG PO TABS: 500.0000 mg | ORAL_TABLET | Freq: Two times a day (BID) | ORAL | 0 refills | Status: DC
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
## 2020-10-22 NOTE — BHH Group Notes (Signed)
ADULT GRIEF GROUP NOTE:   Spiritual care group on grief and loss facilitated by chaplain Dyanne Carrel, [facility]   Group Goal:   Support / Education around grief and loss   Members engage in facilitated group support and psycho-social education.   Group Description:   Following introductions and group rules, group members engaged in facilitated group dialog and support around topic of loss, with particular support around experiences of loss in their lives. Group Identified types of loss (relationships / self / things) and identified patterns, circumstances, and changes that precipitate losses. Reflected on thoughts / feelings around loss, normalized grief responses, and recognized variety in grief experience. Group noted Worden's four tasks of grief in discussion.   Group drew on Adlerian / Rogerian, narrative, MI,   Patient Progress: Madison Harrell joined the group partway through.  She actively engaged in the conversation and share about the loss of a significant friend in her life.  She was able to identify some triggers for her grief.  Chaplain Dyanne Carrel, [facility] Pager, [phone] 12:57 PM

## 2020-10-22 NOTE — BHH Suicide Risk Assessment (Signed)
[facility] Discharge Suicide Risk Assessment   Principal Problem: Severe recurrent major depression without psychotic features [facility]) Discharge Diagnoses: Principal Problem:   Severe recurrent major depression without psychotic features ([facility]) Active Problems:   Diabetes ([facility])  Subjective: Patient seen on rounds. Patient confirms she is not breastfeeding. Slept 6.75 hours. She denies SI, HI, AVH, paranoia, or delusions. She denies medication side-effects and voices no physical complaints. She reports good sleep and appetite. She is feeling more hopeful and positive today and is looking forward to seeing her children. She can articulate a safety and aftercare plan and was encouraged to start psychotherapy after discharge. She was urged to reschedule with her endocrinologist after discharge for management of her DM and to see a PCP for recheck of her mildly elevated platelet count. Time was given for questions.  Total Time Spent in Direct Patient Care:  I personally spent 30 minutes on the unit in direct patient care. The direct patient care time included face-to-face time with the patient, reviewing the patient's chart, communicating with other professionals, and coordinating care. Greater than 50% of this time was spent in counseling or coordinating care with the patient regarding goals of hospitalization, psycho-education, and discharge planning needs.   Blood pressure 110/80, pulse 83, temperature 98.4 F (36.9 C), temperature source Oral, resp. rate 16, height 4\' 11"  (1.499 m), weight 74.8 kg, SpO2 99 %, unknown if currently breastfeeding. Body mass index is 33.33 kg/m.  Musculoskeletal: Strength & Muscle Tone: within normal limits Gait & Station: normal, steady Patient leans: N/A   General Appearance:  casually dressed, good hygiene  Eye Contact:  Good  Speech:  Clear and Coherent and Normal Rate  Volume:  Normal  Mood: described as "good" - appears more euthymic  Affect:  brighter, calm,  polite  Thought Process:  goal directed and linear. Future oriented  Orientation:  Full (Time, Place, and Person)  Thought Content:  Logical and denies AVH, paranoia, or delusions  Suicidal Thoughts:   Denied  Homicidal Thoughts:  Denied  Memory:  Recent;   Good  Judgement:  improving  Insight:  improving  Psychomotor Activity:  Normal  Concentration:  Concentration: Good and Attention Span: Good  Recall:  Good  Fund of Knowledge:  Good  Language:  Good  Akathisia:  Negative  Assets:  Communication Skills Desire for Improvement Housing Resilience Social Support  ADL's:  Intact  Cognition:  WNL    Physical Exam Vitals reviewed. HENT:    Head: Normocephalic. Pulmonary:    Effort: Pulmonary effort is normal. Neurological:    General: No focal deficit present.    Mental Status: She is alert.    Review of Systems Respiratory:  Negative for shortness of breath.   Cardiovascular:  Negative for chest pain. Gastrointestinal:  Negative for diarrhea, nausea and vomiting.   Mental Status Per Nursing Assessment::   On Admission:  Suicidal ideation indicated by patient  Demographic Factors:  Young adult; stay at home mom  Loss Factors: Marriage stressors; quit job  Historical Factors: Previous psychiatric treatment; h/o trauma  Risk Reduction Factors:   Responsible for children under 31 years of age, Sense of responsibility to family, Living with another person, especially a relative, Positive social support, and Positive coping skills or problem solving skills  Continued Clinical Symptoms:  Depression:   Impulsivity Previous Psychiatric Diagnoses and Treatments  Cognitive Features That Contribute To Risk:  None    Suicide Risk:  Minimal: No identifiable suicidal ideation. May be classified as minimal  risk based on the severity of the depressive symptoms   Follow-up Information     Burchette, Santiago Bumpers, LCAS Follow up on 10/31/2020.   Why: You have a hospital follow  up appointment for therapy and medication management services on 10/31/20 at 1:00 pm.  This will be a Virtual appointment.  (You may call to change to in person) Contact information: [address] [place] [address] [phone]         [facility]. Call.   Why: Please call if you would like to re-establish care with the provider for therapy and medication management services. Contact information: [address] [place], [place] [address]    P:  [phone] F:  [phone]                Plan Of Care/Follow-up recommendations:  Activity:  as tolerated Diet:  diabetic Other:  Patient advised to keep scheduled outpatient mental health follow up appointments and to comply with medications. She was advised to call her endocrinologist without fail for management of her diabetes. She was advised to have a primary care physician recheck her elevated platelet count without fail.   Comer Locket, MD, FAPA 10/22/2020, 7:38 AM

## 2020-10-22 NOTE — Progress Notes (Signed)
Recreation Therapy Notes  Date:  6.27.22 Time: 0930  Location: [address] Dayroom  Group Topic: Stress Management  Goal Area(s) Addresses:  Patient will identify positive stress management techniques. Patient will identify benefits of using stress management post d/c.  Intervention: Stress Management  Activity : Meditation.  LRT played a meditation that focused on being resilient when faced with adversity.     Education:  Stress Management, Discharge Planning.   Education Outcome: Acknowledges Education  Clinical Observations/Feedback: Pt did not attend group.    Cashlyn Huguley, LRT/CTRS         Julea Hutto A 10/22/2020 12:48 PM 

## 2020-10-22 NOTE — Progress Notes (Signed)
  [facility] Adult Case Management Discharge Plan :  Will you be returning to the same living situation after discharge:  No. Patient will be living with mother or brother.  At discharge, do you have transportation home?: Yes,  Husband has plans to pick patient up. Do you have the ability to pay for your medications: Yes,  patient has insurance  Release of information consent forms completed and in the chart;  Patient's signature needed at discharge.  Patient to Follow up at:  Follow-up Information     [place], Santiago Bumpers, LCAS Follow up on 10/31/2020.   Why: You have a hospital follow up appointment for therapy and medication management services on 10/31/20 at 1:00 pm.  This will be a Virtual appointment.  (You may call to change to in person) Contact information: [address] [place] [address] [phone]         [name]. Call.   Why: Please call if you would like to re-establish care with the provider for therapy and medication management services. Contact information: [address] [place], [place] [address]    P:  [phone] F:  [phone]                Next level of care provider has access to [facility] Link:no  Safety Planning and Suicide Prevention discussed: Yes,  Spoke with patient brother and to patient.   Have you used any form of tobacco in the last 30 days? (Cigarettes, Smokeless Tobacco, Cigars, and/or Pipes): Yes  Has patient been referred to the Quitline?: Patient refused referral, patient reports that she does not smoke.   Patient has been referred for addiction treatment: N/A  Sharronda Schweers E Chassidy Layson, LCSW 10/22/2020, 9:59 AM

## 2020-10-22 NOTE — Progress Notes (Signed)
   10/21/20 2116  Psych Admission Type (Psych Patients Only)  Admission Status Involuntary  Psychosocial Assessment  Patient Complaints Anxiety  Eye Contact Fair  Facial Expression Animated  Affect Appropriate to circumstance  Speech Logical/coherent  Interaction Assertive  Motor Activity Other (Comment) (WDL)  Appearance/Hygiene Unremarkable  Behavior Characteristics Cooperative;Appropriate to situation  Mood Pleasant;Anxious  Thought Process  Coherency WDL  Content WDL  Delusions None reported or observed  Perception WDL  Hallucination None reported or observed  Judgment WDL  Confusion None  Danger to Self  Current suicidal ideation? Denies  Self-Injurious Behavior No self-injurious ideation or behavior indicators observed or expressed   Agreement Not to Harm Self Yes  Description of Agreement verbal contract  Danger to Others  Danger to Others None reported or observed

## 2020-10-22 NOTE — Final Progress Note (Signed)
Discharge Note:  Patient denies SI/HI AVH at this time. Discharge instructions, AVS, prescriptions and transition record gone over with patient. Patient agrees to comply with medication management, follow-up visit, and outpatient therapy. Patient belongings returned to patient. Patient questions and concerns addressed and answered.  Patient ambulatory off unit.  Patient discharged to home with friend.   

## 2020-10-22 NOTE — Progress Notes (Signed)
Adult Psychoeducational Group Note  Date:  10/22/2020 Time:  8:55 AM  Group Topic/Focus:  Goals Group:   The focus of this group is to help patients establish daily goals to achieve during treatment and discuss how the patient can incorporate goal setting into their daily lives to aide in recovery.  Participation Level:  Active  Participation Quality:  Appropriate  Affect:  Appropriate  Cognitive:  Appropriate  Insight: Good  Engagement in Group:  Engaged  Modes of Intervention:  Discussion  Additional Comments:  Pt attended group and participated in discussion. Pt states that she wants to continue to be positive.  Aubriee Szeto R Chemere Steffler 10/22/2020, 8:55 AM

## 2020-11-16 ENCOUNTER — Other Ambulatory Visit (HOSPITAL_COMMUNITY): Payer: Self-pay | Admitting: Psychiatry

## 2020-11-17 NOTE — Telephone Encounter (Signed)
Patient must see outpatient provider for refills 

## 2020-12-13 ENCOUNTER — Other Ambulatory Visit (HOSPITAL_COMMUNITY): Payer: Self-pay | Admitting: Psychiatry

## 2021-03-18 NOTE — Telephone Encounter (Signed)
Patient is scheduled for 03/27/21 with ABC at 2:10

## 2021-03-27 ENCOUNTER — Encounter: Payer: Self-pay | Admitting: Obstetrics and Gynecology

## 2021-03-27 ENCOUNTER — Ambulatory Visit (INDEPENDENT_AMBULATORY_CARE_PROVIDER_SITE_OTHER): Payer: 59 | Admitting: Obstetrics and Gynecology

## 2021-03-27 ENCOUNTER — Other Ambulatory Visit: Payer: Self-pay

## 2021-03-27 ENCOUNTER — Other Ambulatory Visit (HOSPITAL_COMMUNITY)
Admission: RE | Admit: 2021-03-27 | Discharge: 2021-03-27 | Disposition: A | Discharge status: Home / Self Care | Payer: 59 | Source: Ambulatory Visit | Attending: Obstetrics and Gynecology | Admitting: Obstetrics and Gynecology

## 2021-03-27 VITALS — BP 130/70 | Ht 60.0 in | Wt 175.0 lb

## 2021-03-27 DIAGNOSIS — Z124 Encounter for screening for malignant neoplasm of cervix: Secondary | ICD-10-CM

## 2021-03-27 DIAGNOSIS — R87610 Atypical squamous cells of undetermined significance on cytologic smear of cervix (ASC-US): Secondary | ICD-10-CM | POA: Diagnosis not present

## 2021-03-27 DIAGNOSIS — Z1151 Encounter for screening for human papillomavirus (HPV): Secondary | ICD-10-CM | POA: Insufficient documentation

## 2021-03-27 DIAGNOSIS — R87810 Cervical high risk human papillomavirus (HPV) DNA test positive: Secondary | ICD-10-CM | POA: Diagnosis present

## 2021-03-27 DIAGNOSIS — B3731 Acute candidiasis of vulva and vagina: Secondary | ICD-10-CM | POA: Diagnosis not present

## 2021-03-27 DIAGNOSIS — Z30430 Encounter for insertion of intrauterine contraceptive device: Secondary | ICD-10-CM

## 2021-03-27 LAB — POCT WET PREP WITH KOH
Clue Cells Wet Prep HPF POC: NEGATIVE
KOH Prep POC: NEGATIVE
Trichomonas, UA: NEGATIVE
Yeast Wet Prep HPF POC: POSITIVE

## 2021-03-27 MED ORDER — CLOTRIMAZOLE-BETAMETHASONE 1-0.05 % EX CREA: TOPICAL_CREAM | CUTANEOUS | 0 refills | Status: DC

## 2021-03-27 MED ORDER — LEVONORGESTREL 20 MCG/DAY IU IUD: 1.0000 | INTRAUTERINE_SYSTEM | Freq: Once | INTRAUTERINE | 0 refills | Status: DC

## 2021-03-27 MED ORDER — FLUCONAZOLE 150 MG PO TABS: 150.0000 mg | ORAL_TABLET | Freq: Once | ORAL | 0 refills | Status: AC

## 2021-03-27 NOTE — Patient Instructions (Signed)
I value your feedback and you entrusting us with your care. If you get a [facility] patient survey, I would appreciate you taking the time to let us know about your experience today. Thank you!  [facility] [phone]  Instructions after IUD insertion  Most women experience no significant problems after insertion of an IUD, however minor cramping and spotting for a few days is common. Cramps may be treated with ibuprofen 800mg every 8 hours or Tylenol 650 mg every 4 hours. Contact [facility] immediately if you experience any of the following symptoms during the next week: temperature >99.6 degrees, worsening pelvic pain, abdominal pain, fainting, unusually heavy vaginal bleeding, foul vaginal discharge, or if you think you have expelled the IUD.  Nothing inserted in the vagina for 48 hours. You will be scheduled for a follow up visit in approximately four weeks.  You should check monthly to be sure you can feel the IUD strings in the upper vagina. If you are having a monthly period, try to check after each period. If you cannot feel the IUD strings,  contact [facility] immediately so we can do an exam to determine if the IUD has been expelled.   Please use backup protection until we can confirm the IUD is in place.  Call [facility] if you are exposed to or diagnosed with a sexually transmitted infection, as we will need to discuss whether it is safe for you to continue using an IUD.   
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
## 2021-03-27 NOTE — Telephone Encounter (Signed)
Noted. Mirena rcvd/charged 03/27/2021

## 2021-04-02 LAB — CYTOLOGY - PAP
Comment: NEGATIVE
Diagnosis: UNDETERMINED — AB
High risk HPV: NEGATIVE

## 2021-04-19 ENCOUNTER — Other Ambulatory Visit: Payer: Self-pay

## 2021-04-19 ENCOUNTER — Encounter: Payer: Self-pay | Admitting: Obstetrics

## 2021-04-19 ENCOUNTER — Ambulatory Visit (INDEPENDENT_AMBULATORY_CARE_PROVIDER_SITE_OTHER): Payer: 59 | Admitting: Obstetrics

## 2021-04-19 VITALS — BP 144/90 | Ht 60.0 in | Wt 175.0 lb

## 2021-04-19 DIAGNOSIS — Z30431 Encounter for routine checking of intrauterine contraceptive device: Secondary | ICD-10-CM

## 2021-04-19 DIAGNOSIS — N926 Irregular menstruation, unspecified: Secondary | ICD-10-CM

## 2021-04-19 NOTE — Progress Notes (Signed)
Obstetrics & Gynecology Office Visit   Chief Complaint:  Chief Complaint  Patient presents with   Vaginal Bleeding    Since IUD insertion, severe cramping    History of Present Illness: Madison Harrell presents today with c/o heavier bleeding since an IUD was placed last month. She reports a Hx of irregular periods, and the day after her Mirena was placed, she began to have vaginal bleeding.She is not soaking pads, but the bleeding has been continuous since the IUD was inserted, and she wants to be checked "just to be sure" that everything is okay. She has passed some small clots. She has also had some cramping, and has taken both Ibuprofen and Tylenol for this.    Review of Systems:  Review of Systems  Constitutional: Negative.   HENT: Negative.    Eyes: Negative.   Respiratory: Negative.    Cardiovascular: Negative.   Gastrointestinal: Negative.   Genitourinary: Negative.   Musculoskeletal: Negative.   Skin: Negative.   Neurological: Negative.   Endo/Heme/Allergies: Negative.   Psychiatric/Behavioral: Negative.      Past Medical History:  Past Medical History:  Diagnosis Date   Diabetes mellitus without complication (HCC)    pre E     Past Surgical History:  Past Surgical History:  Procedure Laterality Date   CESAREAN SECTION  2013    Gynecologic History: No LMP recorded. (Menstrual status: IUD).  Obstetric History: [id number]  Family History:  Family History  Problem Relation Age of Onset   Diabetes Father     Social History:  Social History   Socioeconomic History   Marital status: Married    Spouse name: Not on file   Number of children: 2   Years of education: Not on file   Highest education level: Not on file  Occupational History   Not on file  Tobacco Use   Smoking status: Former    Types: Cigarettes   Smokeless tobacco: Never  Vaping Use   Vaping Use: Never used  Substance and Sexual Activity   Alcohol use: No   Drug use: No   Sexual  activity: Yes    Birth control/protection: I.U.D.    Comment: Mirena  Other Topics Concern   Not on file  Social History Narrative   Not on file   Social Determinants of Health   Financial Resource Strain: Not on file  Food Insecurity: Not on file  Transportation Needs: Not on file  Physical Activity: Not on file  Stress: Not on file  Social Connections: Not on file  Intimate Partner Violence: Not on file    Allergies:  No Known Allergies  Medications: Prior to Admission medications   Medication Sig Start Date End Date Taking? Authorizing Provider  clotrimazole-betamethasone (LOTRISONE) cream Apply externally BID for  2 wks 03/27/21  Yes Copland, Alicia B, PA-C  insulin aspart (NOVOLOG) 100 UNIT/ML injection Inject 6 Units into the skin 3 (three) times daily with meals. 11/04/19  Yes Tresea Mall, CNM  insulin detemir (LEVEMIR) 100 UNIT/ML injection Inject 0.1 mLs (10 Units total) into the skin at bedtime. And Inject 0.15 mLs (15 units total) into the skin daily 10/22/20  Yes Laveda Abbe, NP  metFORMIN (GLUCOPHAGE) 500 MG tablet Take 1 tablet (500 mg total) by mouth 2 (two) times daily with a meal. 10/22/20  Yes Laveda Abbe, NP  FLUoxetine (PROZAC) 20 MG capsule Take 1 capsule (20 mg total) by mouth daily. Patient not taking: Reported on 03/27/2021 10/23/20  Laveda Abbe, NP  hydrOXYzine (ATARAX/VISTARIL) 25 MG tablet Take 1 tablet (25 mg total) by mouth 3 (three) times daily as needed for anxiety. Patient not taking: Reported on 03/27/2021 10/22/20   Laveda Abbe, NP  levonorgestrel (MIRENA) 20 MCG/DAY IUD 1 each by Intrauterine route once for 1 dose. 03/27/21 03/27/21  Copland, Ilona Sorrel, PA-C    Physical Exam Vitals:  Vitals:   04/19/21 1133  BP: (!) 144/90   No LMP recorded. (Menstrual status: IUD).  Physical Exam Constitutional:      Appearance: Normal appearance. She is obese.  HENT:     Head: Normocephalic and atraumatic.   Cardiovascular:     Rate and Rhythm: Normal rate and regular rhythm.  Pulmonary:     Effort: Pulmonary effort is normal.     Breath sounds: Normal breath sounds.  Abdominal:     Palpations: Abdomen is soft.     Comments: Higher BMI  Genitourinary:    General: Normal vulva.     Rectum: Normal.     Comments: No rashes  or lesions noted. Spec exam reveals tow short IUD strings at the cervical os. Scant amount of red blood noted in vault- tiny clot retrieved. Bimanual exam shows anterverted uterus, no tenderness, non enlarged. Musculoskeletal:        General: Normal range of motion.     Cervical back: Normal range of motion and neck supple.  Skin:    General: Skin is warm and dry.  Neurological:     General: No focal deficit present.     Mental Status: She is alert and oriented to person, place, and time.  Psychiatric:        Mood and Affect: Mood normal.        Behavior: Behavior normal.     Assessment: 30 y.o. [id number] at 3 weeks post IUD insertion with vaginal bleeding  Plan: Problem List Items Addressed This Visit   None Discussed her previous hx of irregular menstrual cycles and the likelihood that the Mirena insertion is now causing her to shed the endometrial lining. She will trial some Motrin 600mg  q 6 hours for the next day. Also explained that this should slack off in the next few weeks. She is advised to return to the office after a month if the spotting continues. Would consider a scan to check IUD location. , CNM  04/19/2021 12:19 PM

## 2021-04-24 ENCOUNTER — Ambulatory Visit: Payer: 59 | Admitting: Obstetrics and Gynecology

## 2021-05-23 ENCOUNTER — Ambulatory Visit: Payer: 59 | Admitting: Obstetrics and Gynecology

## 2021-05-29 ENCOUNTER — Ambulatory Visit (INDEPENDENT_AMBULATORY_CARE_PROVIDER_SITE_OTHER): Payer: 59 | Admitting: Obstetrics and Gynecology

## 2021-05-29 ENCOUNTER — Other Ambulatory Visit: Payer: Self-pay

## 2021-05-29 ENCOUNTER — Encounter: Payer: Self-pay | Admitting: Obstetrics and Gynecology

## 2021-05-29 VITALS — BP 110/60 | Ht 60.0 in | Wt 177.0 lb

## 2021-05-29 DIAGNOSIS — Z3202 Encounter for pregnancy test, result negative: Secondary | ICD-10-CM

## 2021-05-29 DIAGNOSIS — N761 Subacute and chronic vaginitis: Secondary | ICD-10-CM

## 2021-05-29 DIAGNOSIS — N921 Excessive and frequent menstruation with irregular cycle: Secondary | ICD-10-CM | POA: Diagnosis not present

## 2021-05-29 DIAGNOSIS — Z30431 Encounter for routine checking of intrauterine contraceptive device: Secondary | ICD-10-CM | POA: Diagnosis not present

## 2021-05-29 DIAGNOSIS — Z975 Presence of (intrauterine) contraceptive device: Secondary | ICD-10-CM

## 2021-05-29 DIAGNOSIS — T8332XA Displacement of intrauterine contraceptive device, initial encounter: Secondary | ICD-10-CM

## 2021-05-29 LAB — POCT URINE PREGNANCY: Preg Test, Ur: NEGATIVE

## 2021-05-29 MED ORDER — CLOTRIMAZOLE-BETAMETHASONE 1-0.05 % EX CREA: TOPICAL_CREAM | CUTANEOUS | 0 refills | Status: DC

## 2021-05-29 MED ORDER — FLUCONAZOLE 150 MG PO TABS: 150.0000 mg | ORAL_TABLET | Freq: Once | ORAL | 0 refills | Status: AC

## 2021-05-29 NOTE — Patient Instructions (Signed)
I value your feedback and you entrusting us with your care. If you get a [facility] patient survey, I would appreciate you taking the time to let us know about your experience today. Thank you! ? ? ?
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
## 2021-06-13 ENCOUNTER — Other Ambulatory Visit: Payer: Self-pay

## 2021-06-13 ENCOUNTER — Ambulatory Visit (INDEPENDENT_AMBULATORY_CARE_PROVIDER_SITE_OTHER): Payer: 59

## 2021-06-13 DIAGNOSIS — T8332XA Displacement of intrauterine contraceptive device, initial encounter: Secondary | ICD-10-CM

## 2021-06-13 DIAGNOSIS — Z975 Presence of (intrauterine) contraceptive device: Secondary | ICD-10-CM

## 2021-06-13 DIAGNOSIS — Z30431 Encounter for routine checking of intrauterine contraceptive device: Secondary | ICD-10-CM | POA: Diagnosis not present

## 2021-06-13 DIAGNOSIS — N921 Excessive and frequent menstruation with irregular cycle: Secondary | ICD-10-CM

## 2021-07-05 IMAGING — DX DG CHEST 1V PORT
1 series · 1 of 1 positions shown · non-contrast
Comparison: 08/13/2017

CLINICAL DATA: Fever.

EXAM:
PORTABLE CHEST 1 VIEW

[chest ap]
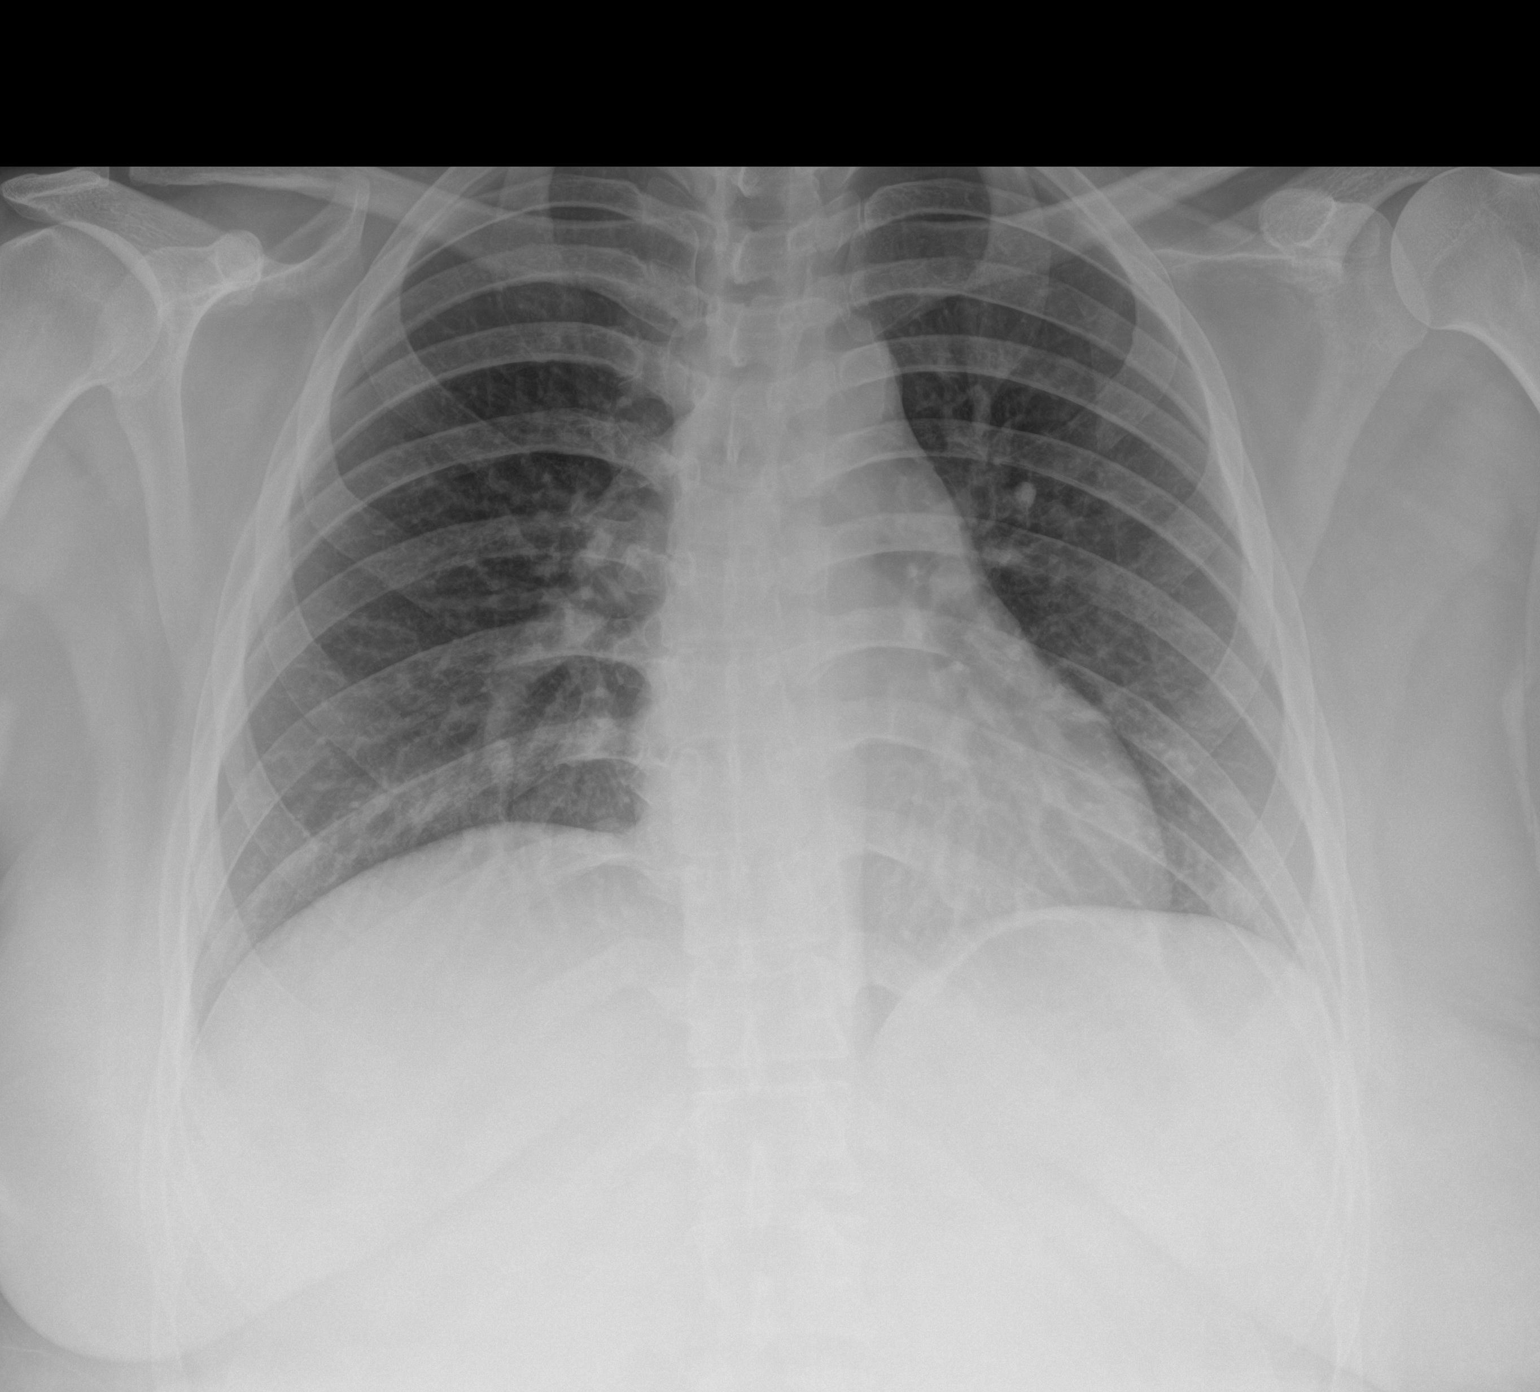

[1 of 1 positions shown; findings below may reference images not displayed]

FINDINGS: The heart size and mediastinal contours are within normal limits.
Both lungs are clear. The visualized skeletal structures are
unremarkable.
IMPRESSION: No active disease.

## 2021-11-11 ENCOUNTER — Other Ambulatory Visit: Payer: Self-pay | Admitting: Licensed Practical Nurse

## 2021-11-11 ENCOUNTER — Telehealth: Payer: Self-pay

## 2021-11-11 DIAGNOSIS — O3680X0 Pregnancy with inconclusive fetal viability, not applicable or unspecified: Secondary | ICD-10-CM

## 2021-11-11 DIAGNOSIS — Z3201 Encounter for pregnancy test, result positive: Secondary | ICD-10-CM

## 2021-11-11 NOTE — Progress Notes (Signed)
  Called with a positive UPT, has IUD in place Order placed for [place] Carie Caddy, CNM  [facility]  11/11/21  6:35 PM

## 2021-11-11 NOTE — Telephone Encounter (Signed)
Patient transferred by Gwynne Edinger, scheduler who states patient called to report positive home pregnancy test. She has an IUD/Mirena. Spoke w/patient. Patient reports she was seen for irregular bleeding with IUD. Reviewed chart. U/S 06/12/21 shows IUD in proper location. Reports she took a test as a joke yesterday and again this morning, both were positive. Patient denies any pregnancy symptoms.She is scheduled for a nurse visit 11/15/21 for pregnancy confirmation. Advised due to having IUD, will send to on call provider to review for possible ultrasound to verify IUD & possibly pregnancy location. She is uncertain of date of conception as bleeding is irregular with and without IUD.

## 2021-11-12 NOTE — Telephone Encounter (Signed)
Left voicemail to notify patient ultrasound has been placed. Someone should be contacting her to schedule an appointment. Advised to return call with an questions or concerns.

## 2021-11-12 NOTE — Telephone Encounter (Signed)
Patient called back and I gave her the number to Centralized Scheduling.

## 2021-11-15 ENCOUNTER — Ambulatory Visit: Payer: 59

## 2021-11-15 ENCOUNTER — Ambulatory Visit
Admission: RE | Admit: 2021-11-15 | Discharge: 2021-11-15 | Disposition: A | Discharge status: Home / Self Care | Payer: BC Managed Care – PPO | Source: Ambulatory Visit | Attending: Licensed Practical Nurse | Admitting: Licensed Practical Nurse

## 2021-11-15 DIAGNOSIS — O3680X0 Pregnancy with inconclusive fetal viability, not applicable or unspecified: Secondary | ICD-10-CM | POA: Insufficient documentation

## 2021-11-15 DIAGNOSIS — Z3A00 Weeks of gestation of pregnancy not specified: Secondary | ICD-10-CM | POA: Diagnosis not present

## 2021-11-15 DIAGNOSIS — O26899 Other specified pregnancy related conditions, unspecified trimester: Secondary | ICD-10-CM | POA: Insufficient documentation

## 2021-11-16 NOTE — Progress Notes (Signed)
I spoke with patient and reviewed results of yesterday's ultrasound- no IUD seen, gestational and yolk sacs seen, no embryo seen. Patient is uncertain if she will keep the pregnancy. It was unplanned as she thought she had her IUD in place.   Tresea Mall, CNM

## 2021-12-04 ENCOUNTER — Ambulatory Visit (INDEPENDENT_AMBULATORY_CARE_PROVIDER_SITE_OTHER): Payer: Medicaid Other

## 2021-12-04 DIAGNOSIS — O0940 Supervision of pregnancy with grand multiparity, unspecified trimester: Secondary | ICD-10-CM

## 2021-12-04 DIAGNOSIS — Z3480 Encounter for supervision of other normal pregnancy, unspecified trimester: Secondary | ICD-10-CM | POA: Insufficient documentation

## 2021-12-04 DIAGNOSIS — Z3A00 Weeks of gestation of pregnancy not specified: Secondary | ICD-10-CM
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
## 2021-12-12 ENCOUNTER — Other Ambulatory Visit: Payer: Medicaid Other

## 2021-12-20 ENCOUNTER — Encounter: Payer: Self-pay | Admitting: Licensed Practical Nurse

## 2021-12-20 ENCOUNTER — Ambulatory Visit
Admission: RE | Admit: 2021-12-20 | Discharge: 2021-12-20 | Disposition: A | Discharge status: Home / Self Care | Payer: BC Managed Care – PPO | Source: Ambulatory Visit | Attending: Licensed Practical Nurse | Admitting: Licensed Practical Nurse

## 2021-12-20 ENCOUNTER — Other Ambulatory Visit: Payer: Self-pay | Admitting: Licensed Practical Nurse

## 2021-12-20 DIAGNOSIS — Z3201 Encounter for pregnancy test, result positive: Secondary | ICD-10-CM | POA: Insufficient documentation

## 2021-12-25 ENCOUNTER — Other Ambulatory Visit (HOSPITAL_COMMUNITY): Payer: Self-pay | Admitting: Psychiatry

## 2022-01-03 ENCOUNTER — Other Ambulatory Visit: Payer: Medicaid Other

## 2022-01-03 ENCOUNTER — Other Ambulatory Visit: Payer: Self-pay

## 2022-01-03 DIAGNOSIS — Z369 Encounter for antenatal screening, unspecified: Secondary | ICD-10-CM

## 2022-01-03 DIAGNOSIS — Z3481 Encounter for supervision of other normal pregnancy, first trimester: Secondary | ICD-10-CM

## 2022-01-06 ENCOUNTER — Other Ambulatory Visit: Payer: Medicaid Other

## 2022-01-06 DIAGNOSIS — Z369 Encounter for antenatal screening, unspecified: Secondary | ICD-10-CM

## 2022-01-06 DIAGNOSIS — Z3481 Encounter for supervision of other normal pregnancy, first trimester: Secondary | ICD-10-CM

## 2022-01-06 DIAGNOSIS — Z3480 Encounter for supervision of other normal pregnancy, unspecified trimester: Secondary | ICD-10-CM

## 2022-01-07 LAB — CBC/D/PLT+RPR+RH+ABO+RUBIGG...
Antibody Screen: NEGATIVE
Basophils Absolute: 0 10*3/uL (ref 0.0–0.2)
Basos: 0 %
EOS (ABSOLUTE): 0.1 10*3/uL (ref 0.0–0.4)
Eos: 1 %
HCV Ab: NONREACTIVE
HIV Screen 4th Generation wRfx: NONREACTIVE
Hematocrit: 41.9 % (ref 34.0–46.6)
Hemoglobin: 14.4 g/dL (ref 11.1–15.9)
Hepatitis B Surface Ag: NEGATIVE
Immature Grans (Abs): 0 10*3/uL (ref 0.0–0.1)
Immature Granulocytes: 0 %
Lymphocytes Absolute: 1.6 10*3/uL (ref 0.7–3.1)
Lymphs: 29 %
MCH: 31.6 pg (ref 26.6–33.0)
MCHC: 34.4 g/dL (ref 31.5–35.7)
MCV: 92 fL (ref 79–97)
Monocytes Absolute: 0.4 10*3/uL (ref 0.1–0.9)
Monocytes: 7 %
Neutrophils Absolute: 3.5 10*3/uL (ref 1.4–7.0)
Neutrophils: 63 %
Platelets: 327 10*3/uL (ref 150–450)
RBC: 4.55 x10E6/uL (ref 3.77–5.28)
RDW: 12.5 % (ref 11.7–15.4)
RPR Ser Ql: NONREACTIVE
Rh Factor: POSITIVE
Rubella Antibodies, IGG: 1.53 index (ref >0.99)
Varicella zoster IgG: 600 index (ref >165)
WBC: 5.6 10*3/uL (ref 3.4–10.8)

## 2022-01-07 LAB — HCV INTERPRETATION

## 2022-01-09 ENCOUNTER — Encounter: Payer: Self-pay | Admitting: Obstetrics & Gynecology

## 2022-01-09 ENCOUNTER — Ambulatory Visit (INDEPENDENT_AMBULATORY_CARE_PROVIDER_SITE_OTHER): Payer: BC Managed Care – PPO | Admitting: Obstetrics & Gynecology

## 2022-01-09 ENCOUNTER — Other Ambulatory Visit (HOSPITAL_COMMUNITY)
Admission: RE | Admit: 2022-01-09 | Discharge: 2022-01-09 | Disposition: A | Discharge status: Home / Self Care | Payer: BC Managed Care – PPO | Source: Ambulatory Visit | Attending: Obstetrics & Gynecology | Admitting: Obstetrics & Gynecology

## 2022-01-09 VITALS — BP 112/82 | Wt 177.0 lb

## 2022-01-09 DIAGNOSIS — O98811 Other maternal infectious and parasitic diseases complicating pregnancy, first trimester: Secondary | ICD-10-CM | POA: Diagnosis not present

## 2022-01-09 DIAGNOSIS — Z124 Encounter for screening for malignant neoplasm of cervix: Secondary | ICD-10-CM | POA: Diagnosis present

## 2022-01-09 DIAGNOSIS — N761 Subacute and chronic vaginitis: Secondary | ICD-10-CM

## 2022-01-09 DIAGNOSIS — Z3A12 12 weeks gestation of pregnancy: Secondary | ICD-10-CM

## 2022-01-09 DIAGNOSIS — E109 Type 1 diabetes mellitus without complications: Secondary | ICD-10-CM

## 2022-01-09 DIAGNOSIS — B3731 Acute candidiasis of vulva and vagina: Secondary | ICD-10-CM | POA: Insufficient documentation

## 2022-01-09 DIAGNOSIS — O99891 Other specified diseases and conditions complicating pregnancy: Secondary | ICD-10-CM | POA: Diagnosis not present

## 2022-01-09 DIAGNOSIS — O24011 Pre-existing diabetes mellitus, type 1, in pregnancy, first trimester: Secondary | ICD-10-CM

## 2022-01-09 DIAGNOSIS — Z3481 Encounter for supervision of other normal pregnancy, first trimester: Secondary | ICD-10-CM

## 2022-01-09 MED ORDER — TERCONAZOLE 0.4 % VA CREA: 1.0000 | TOPICAL_CREAM | Freq: Every day | VAGINAL | 0 refills | Status: DC

## 2022-01-09 NOTE — Progress Notes (Signed)
  Subjective:    Madison Harrell is a 31 y.o.         G 15  P 3-0-11-3obstetrical visit. She is at [redacted]w[redacted]d gestation  by dating [place], by unknown LMP Patient reports nausea, no bleeding, no contractions, no cramping, and no leaking. Fetal movement:  not yet .  Menstrual History: OB History     Gravida  10   Para  4   Term  0   Preterm  4   AB  5   Living  4      SAB  5   IAB  0   Ectopic  0   Multiple  0   Live Births  4           No LMP recorded (lmp unknown). Patient is pregnant.    The following portions of the patient's history were reviewed and updated as appropriate: allergies, current medications, past family history, past medical history, past social history, past surgical history, and problem list.  Review of Systems A comprehensive review of systems was negative.   Objective:     BP 112/82   Wt 177 lb (80.3 kg)   LMP  (LMP Unknown)   BMI 34.57 kg/m  Uterine Size: 10-12 week siz  Pelvic Exam:          Perineum: WNL                Vulva: WNL              Vagina:  WNL                  Cervix: LCP                        Uterus: 10-12 week size             Adnexa: No masses          Assessment:  Pre-gestational Diabetic on Insulin  Pregnancy 12 and 4/7 weeks  Vaginal Yeast Infection Plan:Terazole 7 sent to pharmacy    Problem list reviewed and updated. Labs reviewed. AFP3 discussed:  yes . Role of ultrasound in pregnancy discussed; fetal survey:  discussed . Nu-swab done Transferred care to [facility]

## 2022-01-10 ENCOUNTER — Other Ambulatory Visit: Payer: Self-pay

## 2022-01-10 LAB — CERVICOVAGINAL ANCILLARY ONLY
Bacterial Vaginitis (gardnerella): POSITIVE — AB
Candida Glabrata: POSITIVE — AB
Candida Vaginitis: NEGATIVE
Chlamydia: NEGATIVE
Comment: NEGATIVE
Comment: NEGATIVE
Comment: NEGATIVE
Comment: NEGATIVE
Comment: NEGATIVE
Comment: NORMAL
Neisseria Gonorrhea: NEGATIVE
Trichomonas: NEGATIVE

## 2022-01-10 MED ORDER — METRONIDAZOLE 500 MG PO TABS: 500.0000 mg | ORAL_TABLET | Freq: Two times a day (BID) | ORAL | 0 refills | Status: DC

## 2022-01-11 LAB — MATERNIT 21 PLUS CORE, BLOOD
Fetal Fraction: 3
Result (T21): NEGATIVE
Trisomy 13 (Patau syndrome): NEGATIVE
Trisomy 18 (Edwards syndrome): NEGATIVE
Trisomy 21 (Down syndrome): NEGATIVE

## 2022-01-15 LAB — CYTOLOGY - PAP
Adequacy: ABSENT
Diagnosis: NEGATIVE

## 2022-06-03 NOTE — Telephone Encounter (Signed)
This encounter was created in error - please disregard.

## 2022-06-26 ENCOUNTER — Ambulatory Visit: Payer: BC Managed Care – PPO | Admitting: Licensed Practical Nurse

## 2022-08-04 ENCOUNTER — Encounter: Payer: Self-pay | Admitting: Licensed Practical Nurse

## 2022-08-04 ENCOUNTER — Ambulatory Visit (INDEPENDENT_AMBULATORY_CARE_PROVIDER_SITE_OTHER): Payer: BC Managed Care – PPO | Admitting: Licensed Practical Nurse

## 2022-08-04 DIAGNOSIS — Z98891 History of uterine scar from previous surgery: Secondary | ICD-10-CM

## 2022-08-04 DIAGNOSIS — F3289 Other specified depressive episodes: Secondary | ICD-10-CM

## 2022-08-04 DIAGNOSIS — F53 Postpartum depression: Secondary | ICD-10-CM | POA: Diagnosis not present
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
## 2022-10-01 ENCOUNTER — Ambulatory Visit (INDEPENDENT_AMBULATORY_CARE_PROVIDER_SITE_OTHER): Payer: BC Managed Care – PPO

## 2022-10-01 ENCOUNTER — Encounter: Payer: Self-pay | Admitting: Obstetrics

## 2022-10-01 VITALS — BP 128/81 | HR 99 | Ht 59.0 in | Wt 182.0 lb

## 2022-10-01 DIAGNOSIS — N921 Excessive and frequent menstruation with irregular cycle: Secondary | ICD-10-CM | POA: Diagnosis not present

## 2022-10-01 DIAGNOSIS — N946 Dysmenorrhea, unspecified: Secondary | ICD-10-CM

## 2022-10-01 MED ORDER — LEVONORGEST-ETH ESTRAD 91-DAY 0.15-0.03 &0.01 MG PO TABS
1.0000 | ORAL_TABLET | Freq: Every day | ORAL | 3 refills | Status: AC
Start: 2022-10-01 — End: ?
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
## 2023-01-22 ENCOUNTER — Emergency Department: Payer: BC Managed Care – PPO

## 2023-01-22 ENCOUNTER — Encounter: Payer: Self-pay | Admitting: *Deleted

## 2023-01-22 ENCOUNTER — Other Ambulatory Visit: Payer: Self-pay

## 2023-01-22 ENCOUNTER — Inpatient Hospital Stay
Admission: EM | Admit: 2023-01-22 | Discharge: 2023-01-23 | DRG: 399 | Disposition: A | Discharge status: Home / Self Care | Payer: BC Managed Care – PPO | Attending: Internal Medicine | Admitting: Internal Medicine

## 2023-01-22 DIAGNOSIS — Z825 Family history of asthma and other chronic lower respiratory diseases: Secondary | ICD-10-CM

## 2023-01-22 DIAGNOSIS — Z79899 Other long term (current) drug therapy: Secondary | ICD-10-CM

## 2023-01-22 DIAGNOSIS — I10 Essential (primary) hypertension: Secondary | ICD-10-CM | POA: Diagnosis present

## 2023-01-22 DIAGNOSIS — Z833 Family history of diabetes mellitus: Secondary | ICD-10-CM

## 2023-01-22 DIAGNOSIS — E1065 Type 1 diabetes mellitus with hyperglycemia: Secondary | ICD-10-CM | POA: Diagnosis present

## 2023-01-22 DIAGNOSIS — Z9851 Tubal ligation status: Secondary | ICD-10-CM | POA: Diagnosis not present

## 2023-01-22 DIAGNOSIS — Z794 Long term (current) use of insulin: Secondary | ICD-10-CM | POA: Diagnosis not present

## 2023-01-22 DIAGNOSIS — K353 Acute appendicitis with localized peritonitis, without perforation or gangrene: Secondary | ICD-10-CM | POA: Diagnosis present

## 2023-01-22 DIAGNOSIS — Z87891 Personal history of nicotine dependence: Secondary | ICD-10-CM | POA: Diagnosis not present

## 2023-01-22 DIAGNOSIS — F319 Bipolar disorder, unspecified: Secondary | ICD-10-CM | POA: Diagnosis present

## 2023-01-22 DIAGNOSIS — K760 Fatty (change of) liver, not elsewhere classified: Secondary | ICD-10-CM | POA: Diagnosis present

## 2023-01-22 DIAGNOSIS — R3915 Urgency of urination: Secondary | ICD-10-CM | POA: Diagnosis present

## 2023-01-22 DIAGNOSIS — R8290 Unspecified abnormal findings in urine: Secondary | ICD-10-CM | POA: Diagnosis present

## 2023-01-22 DIAGNOSIS — K3580 Unspecified acute appendicitis: Principal | ICD-10-CM

## 2023-01-22 DIAGNOSIS — R7989 Other specified abnormal findings of blood chemistry: Secondary | ICD-10-CM | POA: Diagnosis present

## 2023-01-22 DIAGNOSIS — Z98891 History of uterine scar from previous surgery: Secondary | ICD-10-CM | POA: Diagnosis not present

## 2023-01-22 DIAGNOSIS — R109 Unspecified abdominal pain: Secondary | ICD-10-CM | POA: Diagnosis present

## 2023-01-22 DIAGNOSIS — K8020 Calculus of gallbladder without cholecystitis without obstruction: Secondary | ICD-10-CM | POA: Diagnosis present

## 2023-01-22 DIAGNOSIS — N139 Obstructive and reflux uropathy, unspecified: Secondary | ICD-10-CM | POA: Diagnosis present

## 2023-01-22 DIAGNOSIS — E785 Hyperlipidemia, unspecified: Secondary | ICD-10-CM | POA: Diagnosis present

## 2023-01-22 DIAGNOSIS — N920 Excessive and frequent menstruation with regular cycle: Secondary | ICD-10-CM | POA: Diagnosis present

## 2023-01-22 DIAGNOSIS — E1165 Type 2 diabetes mellitus with hyperglycemia: Secondary | ICD-10-CM | POA: Diagnosis present

## 2023-01-22 LAB — BLOOD GAS, VENOUS
Acid-Base Excess: 6.6 mmol/L — ABNORMAL HIGH (ref 0.0–2.0)
Bicarbonate: 31.3 mmol/L — ABNORMAL HIGH (ref 20.0–28.0)
O2 Saturation: 79.8 %
Patient temperature: 37
pCO2, Ven: 44 mmHg (ref 44–60)
pH, Ven: 7.46 — ABNORMAL HIGH (ref 7.25–7.43)
pO2, Ven: 43 mmHg (ref 32–45)

## 2023-01-22 LAB — CBC
HCT: 44.6 % (ref 36.0–46.0)
Hemoglobin: 15.6 g/dL — ABNORMAL HIGH (ref 12.0–15.0)
MCH: 29.4 pg (ref 26.0–34.0)
MCHC: 35 g/dL (ref 30.0–36.0)
MCV: 84.2 fL (ref 80.0–100.0)
Platelets: 354 10*3/uL (ref 150–400)
RBC: 5.3 MIL/uL — ABNORMAL HIGH (ref 3.87–5.11)
RDW: 12.7 % (ref 11.5–15.5)
WBC: 12.9 10*3/uL — ABNORMAL HIGH (ref 4.0–10.5)
nRBC: 0 % (ref 0.0–0.2)

## 2023-01-22 LAB — URINALYSIS, ROUTINE W REFLEX MICROSCOPIC
Bilirubin Urine: NEGATIVE
Glucose, UA: >=500 mg/dL — AB
Hgb urine dipstick: NEGATIVE
Ketones, ur: 80 mg/dL — AB
Leukocytes,Ua: NEGATIVE
Nitrite: POSITIVE — AB
Protein, ur: 100 mg/dL — AB
Specific Gravity, Urine: 1.039 — ABNORMAL HIGH (ref 1.005–1.030)
pH: 5 (ref 5.0–8.0)

## 2023-01-22 LAB — COMPREHENSIVE METABOLIC PANEL
ALT: 117 U/L — ABNORMAL HIGH (ref 0–44)
AST: 71 U/L — ABNORMAL HIGH (ref 15–41)
Albumin: 4.1 g/dL (ref 3.5–5.0)
Alkaline Phosphatase: 101 U/L (ref 38–126)
Anion gap: 16 — ABNORMAL HIGH (ref 5–15)
BUN: 13 mg/dL (ref 6–20)
CO2: 20 mmol/L — ABNORMAL LOW (ref 22–32)
Calcium: 8.9 mg/dL (ref 8.9–10.3)
Chloride: 98 mmol/L (ref 98–111)
Creatinine, Ser: 0.54 mg/dL (ref 0.44–1.00)
GFR, Estimated: >60 mL/min (ref >60)
Glucose, Bld: 293 mg/dL — ABNORMAL HIGH (ref 70–99)
Potassium: 3.3 mmol/L — ABNORMAL LOW (ref 3.5–5.1)
Sodium: 134 mmol/L — ABNORMAL LOW (ref 135–145)
Total Bilirubin: 0.9 mg/dL (ref 0.3–1.2)
Total Protein: 7.8 g/dL (ref 6.5–8.1)

## 2023-01-22 LAB — LIPASE, BLOOD: Lipase: 26 U/L (ref 11–51)

## 2023-01-22 LAB — PREGNANCY, URINE: Preg Test, Ur: NEGATIVE

## 2023-01-22 LAB — BETA-HYDROXYBUTYRIC ACID: Beta-Hydroxybutyric Acid: 0.09 mmol/L (ref 0.05–0.27)

## 2023-01-22 MED ORDER — ONDANSETRON HCL 4 MG/2ML IJ SOLN
4.0000 mg | Freq: Once | INTRAMUSCULAR | Status: AC
  Administered 2023-01-22: 4 mg via INTRAVENOUS
  Filled 2023-01-22 (×2): qty 2

## 2023-01-22 MED ORDER — MORPHINE SULFATE (PF) 4 MG/ML IV SOLN
4.0000 mg | Freq: Once | INTRAVENOUS | Status: AC
  Administered 2023-01-22: 4 mg via INTRAVENOUS
  Filled 2023-01-22 (×2): qty 1

## 2023-01-22 MED ORDER — SODIUM CHLORIDE 0.9 % IV SOLN
1.0000 g | Freq: Once | INTRAVENOUS | Status: AC
  Administered 2023-01-22: 1 g via INTRAVENOUS
  Filled 2023-01-22: qty 10

## 2023-01-22 MED ORDER — SODIUM CHLORIDE 0.9 % IV SOLN: Freq: Once | INTRAVENOUS | Status: AC

## 2023-01-22 MED ORDER — KETOROLAC TROMETHAMINE 30 MG/ML IJ SOLN
30.0000 mg | Freq: Once | INTRAMUSCULAR | Status: AC
  Administered 2023-01-22: 30 mg via INTRAMUSCULAR
  Filled 2023-01-22: qty 1

## 2023-01-22 NOTE — Progress Notes (Signed)
Ed staff able to establish PIV access

## 2023-01-22 NOTE — ED Triage Notes (Signed)
Right lower abdominal pain with nausea and vomiting.  This pain is accompanied with n/v and a feeling that she needs to go to the bathroom.  Pt feels like she needs to poop and pee but nothing is coming.  Pain radiates to lower back. Pt states that she is not pregnant and she has had her tubes tied in January.  LMP last week.

## 2023-01-22 NOTE — ED Provider Notes (Signed)
[facility] Provider Note    Event Date/Time   First MD Initiated Contact with Patient 01/22/23 1721     (approximate)   History   Abdominal Pain   HPI  Madison Harrell is a 32 y.o. female with history of diabetes who presents with complaints of right lower abdominal pain which started relatively abruptly this morning.  She reports nausea and vomiting.  No history of kidney stones.  Denies dysuria.  No fevers chills reported.  History of a C-section     Physical Exam   Triage Vital Signs: ED Triage Vitals  Encounter Vitals Group     BP 01/22/23 1701 (!) 136/93     Systolic BP Percentile --      Diastolic BP Percentile --      Pulse Rate 01/22/23 1701 (!) 116     Resp 01/22/23 1701 (!) 22     Temp 01/22/23 1701 98.7 F (37.1 C)     Temp Source 01/22/23 1701 Oral     SpO2 01/22/23 1701 100 %     Weight --      Height --      Head Circumference --      Peak Flow --      Pain Score 01/22/23 1700 10     Pain Loc --      Pain Education --      Exclude from Growth Chart --     Most recent vital signs: Vitals:   01/22/23 1701 01/22/23 2041  BP: (!) 136/93 118/70  Pulse: (!) 116 89  Resp: (!) 22 16  Temp: 98.7 F (37.1 C)   SpO2: 100% 99%     General: Awake, no distress.  CV:  Good peripheral perfusion.  Resp:  Normal effort.  Abd:  No distention.  Tenderness palpation the right lower quadrant, possibly some mild right CVA tenderness as well Other:     ED Results / Procedures / Treatments   Labs (all labs ordered are listed, but only abnormal results are displayed) Labs Reviewed  COMPREHENSIVE METABOLIC PANEL - Abnormal; Notable for the following components:      Result Value   Sodium 134 (*)    Potassium 3.3 (*)    CO2 20 (*)    Glucose, Bld 293 (*)    AST 71 (*)    ALT 117 (*)    Anion gap 16 (*)    All other components within normal limits  CBC - Abnormal; Notable for the following components:   WBC 12.9 (*)    RBC  5.30 (*)    Hemoglobin 15.6 (*)    All other components within normal limits  URINALYSIS, ROUTINE W REFLEX MICROSCOPIC - Abnormal; Notable for the following components:   Color, Urine YELLOW (*)    APPearance CLEAR (*)    Specific Gravity, Urine 1.039 (*)    Glucose, UA >=500 (*)    Ketones, ur 80 (*)    Protein, ur 100 (*)    Nitrite POSITIVE (*)    Bacteria, UA RARE (*)    All other components within normal limits  BLOOD GAS, VENOUS - Abnormal; Notable for the following components:   pH, Ven 7.46 (*)    Bicarbonate 31.3 (*)    Acid-Base Excess 6.6 (*)    All other components within normal limits  LIPASE, BLOOD  PREGNANCY, URINE  BETA-HYDROXYBUTYRIC ACID     EKG     RADIOLOGY CT renal stone study viewed to read  by me, no ureterolithiasis noted, pending radiology read    PROCEDURES:  Critical Care performed:   Procedures   MEDICATIONS ORDERED IN ED: Medications  cefTRIAXone (ROCEPHIN) 1 g in sodium chloride 0.9 % 100 mL IVPB (1 g Intravenous New Bag/Given 01/22/23 2049)  morphine (PF) 4 MG/ML injection 4 mg (4 mg Intravenous Given 01/22/23 2008)  ondansetron (ZOFRAN) injection 4 mg (4 mg Intravenous Given 01/22/23 2008)  0.9 %  sodium chloride infusion ( Intravenous New Bag/Given 01/22/23 2001)  ketorolac (TORADOL) 30 MG/ML injection 30 mg (30 mg Intramuscular Given 01/22/23 1901)     IMPRESSION / MDM / ASSESSMENT AND PLAN / ED COURSE  I reviewed the triage vital signs and the nursing notes. Patient's presentation is most consistent with acute presentation with potential threat to life or bodily function.  Patient presents with right lower quadrant abdominal pain as detailed above, differential includes ureterolithiasis, UTI, pyelonephritis, appendicitis  Patient is tachycardic upon arrival and appears uncomfortable, treated with IV morphine, IV Zofran, IM Toradol while we awaited I IV access was difficult  Lab work demonstrates mildly elevated white blood cell  count, urinalysis demonstrates positive nitrites, no leukocytes  Mildly elevated anion gap, CO2 of 20 however pH is 7.46 which was reassuring, some ketones noted in urine, pending beta hydroxy keep butyric acid  IV fluids are infusing.  Ct scan is consistent with appendicitis, have consulted Dr. Maurine Minister of surgery, he will admit the patient      FINAL CLINICAL IMPRESSION(S) / ED DIAGNOSES   Final diagnoses:  Acute appendicitis, unspecified acute appendicitis type     Rx / DC Orders   ED Discharge Orders     None        Note:  This document was prepared using Dragon voice recognition software and may include unintentional dictation errors.   Jene Every, MD 01/22/23 2102

## 2023-01-22 NOTE — H&P (Signed)
CC: abdominal pain  HPI: 65F with PMH significant for DM that presents with one day history of abdominal pain and vomiting. She reports the pain is stabbing in nature and radiates througout her aabdomen . She has vomited several times today. She also reports chills as well as some diarrhea. She denies any dysuria. She denies pain similar ot this.   PMH: DM, last A1C was in 7s per patient  PSH: C-section x 2, and tubal ligation  Social hx: Denies tobacco use  Meds:  Novolog Lexapro metformi  Physical Exam Wt Readings from Last 3 Encounters:  10/01/22 82.6 kg  08/04/22 77.5 kg  01/09/22 80.3 kg   Temp Readings from Last 3 Encounters:  01/22/23 98.7 F (37.1 C) (Oral)  10/17/20 98.2 F (36.8 C) (Oral)  11/04/19 98.3 F (36.8 C) (Oral)   BP Readings from Last 3 Encounters:  01/22/23 (!) 140/85  10/01/22 128/81  08/04/22 132/80   Pulse Readings from Last 3 Encounters:  01/22/23 92  10/01/22 99  08/04/22 (!) 107   Abdomen is soft, non-distended, most tender in RLQ with suprapubic and epigastric tenderness. NO rebound tenderness or guarding.     Labs: Lab Results  Component Value Date   WBC 12.9 (H) 01/22/2023   HGB 15.6 (H) 01/22/2023   HCT 44.6 01/22/2023   MCV 84.2 01/22/2023   PLT 354 01/22/2023   Imaging: CT reviewed, stranding around appendix concerning for acute appendicitis  A/P: 32y F with one day history of abdominal pain, Labs and CT suggestive of acute appendicitis. Will proceed to OR for lap appy in AM. NPO after midnight , okay for ice chips until then Start on zosyn PRN pain control Consent signed after discussion with patient of r/b/a including infection, bleeding, damage to colon and small bowel and staple line leak Given EPIC access issues (I am unable to put in admission orders) Hospital team has graciously agreed to take patient on their service, appreciate assistance. DM per their team.   Baker Pierini, M.D.

## 2023-01-22 NOTE — ED Notes (Signed)
MD at bedside. Pt educated on npo status.

## 2023-01-23 ENCOUNTER — Inpatient Hospital Stay: Payer: BC Managed Care – PPO

## 2023-01-23 ENCOUNTER — Other Ambulatory Visit: Payer: Self-pay

## 2023-01-23 ENCOUNTER — Inpatient Hospital Stay: Payer: BC Managed Care – PPO | Admitting: Anesthesiology

## 2023-01-23 ENCOUNTER — Encounter: Payer: Self-pay | Admitting: Internal Medicine

## 2023-01-23 ENCOUNTER — Encounter
Admission: EM | Disposition: A | Discharge status: Home / Self Care | Payer: Self-pay | Source: Home / Self Care | Attending: Internal Medicine

## 2023-01-23 DIAGNOSIS — K3580 Unspecified acute appendicitis: Secondary | ICD-10-CM | POA: Diagnosis not present

## 2023-01-23 DIAGNOSIS — K353 Acute appendicitis with localized peritonitis, without perforation or gangrene: Secondary | ICD-10-CM

## 2023-01-23 DIAGNOSIS — K8020 Calculus of gallbladder without cholecystitis without obstruction: Secondary | ICD-10-CM

## 2023-01-23 DIAGNOSIS — K760 Fatty (change of) liver, not elsewhere classified: Secondary | ICD-10-CM

## 2023-01-23 DIAGNOSIS — E1165 Type 2 diabetes mellitus with hyperglycemia: Secondary | ICD-10-CM | POA: Diagnosis present

## 2023-01-23 DIAGNOSIS — F319 Bipolar disorder, unspecified: Secondary | ICD-10-CM

## 2023-01-23 HISTORY — PX: LAPAROSCOPIC APPENDECTOMY: SHX408

## 2023-01-23 LAB — CBC
HCT: 37.4 % (ref 36.0–46.0)
Hemoglobin: 13.2 g/dL (ref 12.0–15.0)
MCH: 30.1 pg (ref 26.0–34.0)
MCHC: 35.3 g/dL (ref 30.0–36.0)
MCV: 85.2 fL (ref 80.0–100.0)
Platelets: 293 10*3/uL (ref 150–400)
RBC: 4.39 MIL/uL (ref 3.87–5.11)
RDW: 12.8 % (ref 11.5–15.5)
WBC: 7.9 10*3/uL (ref 4.0–10.5)
nRBC: 0 % (ref 0.0–0.2)

## 2023-01-23 LAB — COMPREHENSIVE METABOLIC PANEL
ALT: 86 U/L — ABNORMAL HIGH (ref 0–44)
AST: 38 U/L (ref 15–41)
Albumin: 3.2 g/dL — ABNORMAL LOW (ref 3.5–5.0)
Alkaline Phosphatase: 76 U/L (ref 38–126)
Anion gap: 9 (ref 5–15)
BUN: 14 mg/dL (ref 6–20)
CO2: 22 mmol/L (ref 22–32)
Calcium: 7.6 mg/dL — ABNORMAL LOW (ref 8.9–10.3)
Chloride: 104 mmol/L (ref 98–111)
Creatinine, Ser: 0.43 mg/dL — ABNORMAL LOW (ref 0.44–1.00)
GFR, Estimated: >60 mL/min (ref >60)
Glucose, Bld: 286 mg/dL — ABNORMAL HIGH (ref 70–99)
Potassium: 3.2 mmol/L — ABNORMAL LOW (ref 3.5–5.1)
Sodium: 135 mmol/L (ref 135–145)
Total Bilirubin: 1.2 mg/dL (ref 0.3–1.2)
Total Protein: 6.3 g/dL — ABNORMAL LOW (ref 6.5–8.1)

## 2023-01-23 LAB — HEMOGLOBIN A1C
Hgb A1c MFr Bld: 12.1 % — ABNORMAL HIGH (ref 4.8–5.6)
Mean Plasma Glucose: 300.57 mg/dL

## 2023-01-23 LAB — CBG MONITORING, ED
Glucose-Capillary: 301 mg/dL — ABNORMAL HIGH (ref 70–99)
Glucose-Capillary: 287 mg/dL — ABNORMAL HIGH (ref 70–99)
Glucose-Capillary: 250 mg/dL — ABNORMAL HIGH (ref 70–99)

## 2023-01-23 LAB — HIV ANTIBODY (ROUTINE TESTING W REFLEX): HIV Screen 4th Generation wRfx: NONREACTIVE

## 2023-01-23 SURGERY — APPENDECTOMY, LAPAROSCOPIC
Anesthesia: General | Site: Abdomen

## 2023-01-23 MED ORDER — BUPIVACAINE LIPOSOME 1.3 % IJ SUSP
INTRAMUSCULAR | Status: AC
  Filled 2023-01-23: qty 10

## 2023-01-23 MED ORDER — ACETAMINOPHEN 325 MG RE SUPP: 650.0000 mg | Freq: Four times a day (QID) | RECTAL | Status: DC | PRN

## 2023-01-23 MED ORDER — 0.9 % SODIUM CHLORIDE (POUR BTL) OPTIME
TOPICAL | Status: DC | PRN
  Administered 2023-01-23: 500 mL

## 2023-01-23 MED ORDER — POTASSIUM CHLORIDE CRYS ER 20 MEQ PO TBCR
40.0000 meq | EXTENDED_RELEASE_TABLET | Freq: Once | ORAL | Status: AC
  Administered 2023-01-23: 40 meq via ORAL

## 2023-01-23 MED ORDER — INSULIN ASPART 100 UNIT/ML IJ SOLN
INTRAMUSCULAR | Status: AC
  Filled 2023-01-23: qty 1

## 2023-01-23 MED ORDER — FENTANYL CITRATE (PF) 100 MCG/2ML IJ SOLN
25.0000 ug | INTRAMUSCULAR | Status: DC | PRN
  Administered 2023-01-23 (×3): 25 ug via INTRAVENOUS

## 2023-01-23 MED ORDER — BUPIVACAINE-EPINEPHRINE (PF) 0.5% -1:200000 IJ SOLN
INTRAMUSCULAR | Status: DC | PRN
  Administered 2023-01-23: 20 mL via INTRAMUSCULAR

## 2023-01-23 MED ORDER — FENTANYL CITRATE (PF) 100 MCG/2ML IJ SOLN
INTRAMUSCULAR | Status: DC | PRN
  Administered 2023-01-23 (×2): 50 ug via INTRAVENOUS

## 2023-01-23 MED ORDER — SUGAMMADEX SODIUM 200 MG/2ML IV SOLN
INTRAVENOUS | Status: DC | PRN
  Administered 2023-01-23: 200 mg via INTRAVENOUS

## 2023-01-23 MED ORDER — SODIUM CHLORIDE 0.9 % IV SOLN
12.5000 mg | Freq: Four times a day (QID) | INTRAVENOUS | Status: DC | PRN
  Administered 2023-01-23: 12.5 mg via INTRAVENOUS
  Filled 2023-01-23: qty 12.5

## 2023-01-23 MED ORDER — POTASSIUM CHLORIDE CRYS ER 20 MEQ PO TBCR
EXTENDED_RELEASE_TABLET | ORAL | Status: AC
  Filled 2023-01-23: qty 2

## 2023-01-23 MED ORDER — ESCITALOPRAM OXALATE 10 MG PO TABS
25.0000 mg | ORAL_TABLET | Freq: Every day | ORAL | Status: DC
  Administered 2023-01-23: 25 mg via ORAL
  Filled 2023-01-23: qty 3

## 2023-01-23 MED ORDER — PIPERACILLIN-TAZOBACTAM 3.375 G IVPB
INTRAVENOUS | Status: AC
  Filled 2023-01-23: qty 50

## 2023-01-23 MED ORDER — ONDANSETRON HCL 4 MG/2ML IJ SOLN
4.0000 mg | Freq: Four times a day (QID) | INTRAMUSCULAR | Status: DC | PRN
  Administered 2023-01-23 (×3): 4 mg via INTRAVENOUS
  Filled 2023-01-23 (×2): qty 2

## 2023-01-23 MED ORDER — OXYCODONE HCL 5 MG/5ML PO SOLN: 5.0000 mg | Freq: Once | ORAL | Status: AC | PRN

## 2023-01-23 MED ORDER — INSULIN ASPART 100 UNIT/ML IJ SOLN
5.0000 [IU] | Freq: Once | INTRAMUSCULAR | Status: AC
  Administered 2023-01-23: 5 [IU] via SUBCUTANEOUS

## 2023-01-23 MED ORDER — ROCURONIUM BROMIDE 100 MG/10ML IV SOLN
INTRAVENOUS | Status: DC | PRN
  Administered 2023-01-23 (×2): 20 mg via INTRAVENOUS
  Administered 2023-01-23: 10 mg via INTRAVENOUS
  Administered 2023-01-23: 30 mg via INTRAVENOUS

## 2023-01-23 MED ORDER — ONDANSETRON HCL 4 MG/2ML IJ SOLN
INTRAMUSCULAR | Status: AC
  Filled 2023-01-23: qty 2

## 2023-01-23 MED ORDER — ACETAMINOPHEN 325 MG PO TABS: 1000.0000 mg | ORAL_TABLET | Freq: Three times a day (TID) | ORAL | 0 refills | Status: AC | PRN

## 2023-01-23 MED ORDER — INSULIN ASPART 100 UNIT/ML IJ SOLN
7.0000 [IU] | Freq: Once | INTRAMUSCULAR | Status: AC
  Administered 2023-01-23: 7 [IU] via SUBCUTANEOUS

## 2023-01-23 MED ORDER — INSULIN GLARGINE-YFGN 100 UNIT/ML ~~LOC~~ SOLN
20.0000 [IU] | Freq: Every day | SUBCUTANEOUS | Status: DC
  Administered 2023-01-23: 20 [IU] via SUBCUTANEOUS
  Filled 2023-01-23 (×2): qty 0.2

## 2023-01-23 MED ORDER — SODIUM CHLORIDE 0.9 % IV BOLUS
1000.0000 mL | Freq: Once | INTRAVENOUS | Status: AC
  Administered 2023-01-23: 1000 mL via INTRAVENOUS

## 2023-01-23 MED ORDER — QUETIAPINE FUMARATE 25 MG PO TABS: 25.0000 mg | ORAL_TABLET | Freq: Two times a day (BID) | ORAL | Status: DC | PRN

## 2023-01-23 MED ORDER — IBUPROFEN 800 MG PO TABS: 800.0000 mg | ORAL_TABLET | Freq: Three times a day (TID) | ORAL | 0 refills | Status: AC | PRN

## 2023-01-23 MED ORDER — PROPOFOL 1000 MG/100ML IV EMUL
INTRAVENOUS | Status: AC
  Filled 2023-01-23: qty 100

## 2023-01-23 MED ORDER — PIPERACILLIN-TAZOBACTAM 3.375 G IVPB
3.3750 g | Freq: Three times a day (TID) | INTRAVENOUS | Status: DC
  Administered 2023-01-23 (×2): 3.375 g via INTRAVENOUS
  Filled 2023-01-23: qty 50

## 2023-01-23 MED ORDER — FENTANYL CITRATE (PF) 100 MCG/2ML IJ SOLN
INTRAMUSCULAR | Status: AC
  Filled 2023-01-23: qty 2

## 2023-01-23 MED ORDER — DEXAMETHASONE SODIUM PHOSPHATE 10 MG/ML IJ SOLN
INTRAMUSCULAR | Status: DC | PRN
  Administered 2023-01-23: 10 mg via INTRAVENOUS

## 2023-01-23 MED ORDER — ACETAMINOPHEN 10 MG/ML IV SOLN
INTRAVENOUS | Status: AC
  Filled 2023-01-23: qty 100

## 2023-01-23 MED ORDER — OXYCODONE HCL 5 MG PO TABS
5.0000 mg | ORAL_TABLET | Freq: Once | ORAL | Status: AC | PRN
  Administered 2023-01-23: 5 mg via ORAL

## 2023-01-23 MED ORDER — OXYCODONE HCL 5 MG PO TABS: 5.0000 mg | ORAL_TABLET | Freq: Four times a day (QID) | ORAL | 0 refills | Status: DC | PRN

## 2023-01-23 MED ORDER — SODIUM CHLORIDE 0.9 % IR SOLN
Status: DC | PRN
Start: 2023-01-23 — End: 2023-01-23
  Administered 2023-01-23: 1000 mL

## 2023-01-23 MED ORDER — INSULIN PEN NEEDLE 32G X 4 MM MISC: 0 refills | Status: DC

## 2023-01-23 MED ORDER — LACTATED RINGERS IV SOLN: INTRAVENOUS | Status: DC

## 2023-01-23 MED ORDER — FENTANYL CITRATE PF 50 MCG/ML IJ SOSY
50.0000 ug | PREFILLED_SYRINGE | INTRAMUSCULAR | Status: DC | PRN
  Administered 2023-01-23: 50 ug via INTRAVENOUS

## 2023-01-23 MED ORDER — LEVEMIR FLEXPEN 100 UNIT/ML ~~LOC~~ SOPN: 40.0000 [IU] | PEN_INJECTOR | Freq: Every day | SUBCUTANEOUS | 0 refills | Status: DC

## 2023-01-23 MED ORDER — ONDANSETRON HCL 4 MG PO TABS: 4.0000 mg | ORAL_TABLET | Freq: Four times a day (QID) | ORAL | Status: DC | PRN

## 2023-01-23 MED ORDER — BUPIVACAINE-EPINEPHRINE (PF) 0.5% -1:200000 IJ SOLN
INTRAMUSCULAR | Status: AC
  Filled 2023-01-23: qty 10

## 2023-01-23 MED ORDER — OXYCODONE HCL 5 MG PO TABS
ORAL_TABLET | ORAL | Status: AC
  Filled 2023-01-23: qty 1

## 2023-01-23 MED ORDER — PROPOFOL 10 MG/ML IV BOLUS
INTRAVENOUS | Status: DC | PRN
  Administered 2023-01-23: 150 mg via INTRAVENOUS
  Administered 2023-01-23: 150 ug/kg/min via INTRAVENOUS

## 2023-01-23 MED ORDER — SUCCINYLCHOLINE CHLORIDE 200 MG/10ML IV SOSY
PREFILLED_SYRINGE | INTRAVENOUS | Status: DC | PRN
  Administered 2023-01-23: 140 mg via INTRAVENOUS

## 2023-01-23 MED ORDER — MORPHINE SULFATE (PF) 2 MG/ML IV SOLN
2.0000 mg | INTRAVENOUS | Status: DC | PRN
  Administered 2023-01-23 (×4): 2 mg via INTRAVENOUS
  Filled 2023-01-23 (×4): qty 1

## 2023-01-23 MED ORDER — ACETAMINOPHEN 325 MG PO TABS: 650.0000 mg | ORAL_TABLET | Freq: Four times a day (QID) | ORAL | Status: DC | PRN

## 2023-01-23 MED ORDER — INSULIN PEN NEEDLE 32G X 4 MM MISC: 0 refills | Status: AC

## 2023-01-23 MED ORDER — MIDAZOLAM HCL 2 MG/2ML IJ SOLN
INTRAMUSCULAR | Status: AC
  Filled 2023-01-23: qty 2

## 2023-01-23 MED ORDER — LIDOCAINE HCL (CARDIAC) PF 100 MG/5ML IV SOSY
PREFILLED_SYRINGE | INTRAVENOUS | Status: DC | PRN
  Administered 2023-01-23: 100 mg via INTRAVENOUS

## 2023-01-23 MED ORDER — ACETAMINOPHEN 10 MG/ML IV SOLN
INTRAVENOUS | Status: DC | PRN
  Administered 2023-01-23: 1000 mg via INTRAVENOUS

## 2023-01-23 MED ORDER — FENTANYL CITRATE PF 50 MCG/ML IJ SOSY
PREFILLED_SYRINGE | INTRAMUSCULAR | Status: AC
  Filled 2023-01-23: qty 1

## 2023-01-23 SURGICAL SUPPLY — 46 items
ADH SKN CLS APL DERMABOND .7 (GAUZE/BANDAGES/DRESSINGS) ×1
APPLIER CLIP ROT 10 11.4 M/L (STAPLE) ×1
APR CLP MED LRG 11.4X10 (STAPLE) ×1
CLIP APPLIE ROT 10 11.4 M/L (STAPLE) IMPLANT
CORD MONOPOLAR M/FML 12FT (MISCELLANEOUS) ×1 IMPLANT
CUTTER FLEX LINEAR 45M (STAPLE) IMPLANT
DERMABOND ADVANCED .7 DNX12 (GAUZE/BANDAGES/DRESSINGS) ×1 IMPLANT
ELECT REM PT RETURN 9FT ADLT (ELECTROSURGICAL) ×1
ELECTRODE REM PT RTRN 9FT ADLT (ELECTROSURGICAL) ×1 IMPLANT
GLOVE BIOGEL PI IND STRL 7.5 (GLOVE) ×1 IMPLANT
GLOVE SURG SYN 7.0 (GLOVE) ×1 IMPLANT
GLOVE SURG SYN 7.0 PF PI (GLOVE) ×1 IMPLANT
GOWN STRL REUS W/ TWL LRG LVL3 (GOWN DISPOSABLE) ×2 IMPLANT
GOWN STRL REUS W/TWL LRG LVL3 (GOWN DISPOSABLE) ×2
GRASPER SUT TROCAR 14GX15 (MISCELLANEOUS) ×1 IMPLANT
IRRIGATION STRYKERFLOW (MISCELLANEOUS) IMPLANT
IRRIGATOR STRYKERFLOW (MISCELLANEOUS) ×1
IV NS 1000ML (IV SOLUTION) ×1
IV NS 1000ML BAXH (IV SOLUTION) IMPLANT
KIT TURNOVER KIT A (KITS) ×1 IMPLANT
MANIFOLD NEPTUNE II (INSTRUMENTS) ×1 IMPLANT
NDL HYPO 22X1.5 SAFETY MO (MISCELLANEOUS) ×1 IMPLANT
NEEDLE HYPO 22X1.5 SAFETY MO (MISCELLANEOUS) ×1 IMPLANT
NEEDLE VERESS 14GA 120MM (NEEDLE) ×1 IMPLANT
NS IRRIG 500ML POUR BTL (IV SOLUTION) ×1 IMPLANT
PACK LAP CHOLECYSTECTOMY (MISCELLANEOUS) ×1 IMPLANT
RELOAD 45 VASCULAR/THIN (ENDOMECHANICALS) ×2 IMPLANT
RELOAD STAPLE 45 2.5 WHT GRN (ENDOMECHANICALS) IMPLANT
RELOAD STAPLE 45 2.6 WHT THIN (STAPLE) IMPLANT
RELOAD STAPLE 45 3.5 BLU ETS (ENDOMECHANICALS) IMPLANT
RELOAD STAPLE TA45 3.5 REG BLU (ENDOMECHANICALS) ×1 IMPLANT
SCISSORS METZENBAUM CVD 33 (INSTRUMENTS) ×1 IMPLANT
SET TUBE SMOKE EVAC HIGH FLOW (TUBING) ×1 IMPLANT
SLEEVE SCD COMPRESS KNEE MED (STOCKING) IMPLANT
SLEEVE Z-THREAD 5X100MM (TROCAR) ×1 IMPLANT
STAPLE RELOAD 45 WHT (STAPLE) IMPLANT
SUT MNCRL AB 4-0 PS2 18 (SUTURE) ×1 IMPLANT
SUT VIC AB 0 SH 27 (SUTURE) ×1 IMPLANT
SUT VICRYL 0 UR6 27IN ABS (SUTURE) IMPLANT
SYS BAG RETRIEVAL 10MM (BASKET) ×1
SYSTEM BAG RETRIEVAL 10MM (BASKET) ×1 IMPLANT
TRAP FLUID SMOKE EVACUATOR (MISCELLANEOUS) ×1 IMPLANT
TRAY FOLEY MTR SLVR 16FR STAT (SET/KITS/TRAYS/PACK) ×1 IMPLANT
TROCAR Z-THREAD FIOS 12X100MM (TROCAR) ×1 IMPLANT
TROCAR Z-THREAD FIOS 5X100MM (TROCAR) ×1 IMPLANT
WATER STERILE IRR 500ML POUR (IV SOLUTION) ×1 IMPLANT
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
## 2023-01-23 NOTE — Discharge Summary (Addendum)
Physician Discharge Summary   Patient: Madison Harrell MRN: [id number] DOB: 07/06/1990  Admit date:     01/22/2023  Discharge date: 01/23/23  Discharge Physician: Lurene Shadow   PCP: [facility]   Recommendations at discharge:   Follow-up with Dr. Baker Pierini, general surgeon, in 2 weeks  Discharge Diagnoses: Principal Problem:   Acute appendicitis Active Problems:   Type 2 diabetes mellitus with hyperglycemia (HCC)   Cholelithiasis   Hepatic steatosis   Bipolar mood disorder (HCC)  Resolved Problems:   * No resolved hospital problems. *  Hospital Course:  Madison Harrell is a 32 year old woman with medical history significant for type II DM, bipolar disorder, hypertension, hyperlipidemia, heavy menstrual bleeding, who presented to the hospital with right lower quadrant abdominal pain, nausea, vomiting and urinary urgency.  She was found to have acute appendicitis.  CT abdomen and abdominal ultrasound also showed cholelithiasis and hepatic steatosis without cholecystitis. She was treated with IV fluids, analgesics and empiric IV antibiotics.  She was seen by Dr. Maurine Minister, general surgeon, and she underwent laparoscopic appendectomy.  She did well postoperatively she is okay for discharge from the surgeon's standpoint.   She said she has type II DM.  She was diagnosed at around age 41.  She said she was on insulin pump for glucose control but has been without her insulin pump for about a month now.  She attributes this to change in insurance.  She has been using NovoLog with meals and sliding scale and has been using up to 200 units of NovoLog to control her glucose.  She had run out of Levemir (was taking 40 to 42 units daily).  Despite this, her glucose levels have been in the 200s.  She said she is picking up a new insulin pump in the next few days. She has been given a new prescription for Levemir pen 40 units nightly and Levemir pen needles.  She can use this along  with NovoLog until she has new insulin pump with new settings.  She is deemed stable for discharge to home today.  Discharge plan was discussed with the patient and her husband at the bedside.    ADDENDUM  I called her pharmacy at [facility] [phone] and gave verbal order for insulin pen needle 32G x 4 mm.  Pharmacy confirmed that patient had already picked up Levemir pens.     Pain control - [employer] Controlled Substance Reporting System database was reviewed. and patient was instructed, not to drive, operate heavy machinery, perform activities at heights, swimming or participation in water activities or provide baby-sitting services while on Pain, Sleep and Anxiety Medications; until their outpatient Physician has advised to do so again. Also recommended to not to take more than prescribed Pain, Sleep and Anxiety Medications.  Consultants: General Surgeon Procedures performed: Laparoscopic appendectomy Disposition: Home Diet recommendation:  Discharge Diet Orders (From admission, onward)     Start     Ordered   01/23/23 0000  Diet - low sodium heart healthy        01/23/23 1116   01/23/23 0000  Diet Carb Modified        01/23/23 1357           Cardiac and Carb modified diet DISCHARGE MEDICATION: Allergies as of 01/23/2023   No Known Allergies      Medication List     TAKE these medications    acetaminophen 325 MG tablet Commonly known as: TYLENOL Take 3 tablets (975  mg total) by mouth every 8 (eight) hours as needed for mild pain (pain).   ergocalciferol 1.25 MG (50000 UT) capsule Commonly known as: VITAMIN D2 Take 50,000 Units by mouth once a week. Takes on Wednesdays.   escitalopram 10 MG tablet Commonly known as: LEXAPRO Take 25 mg by mouth daily.   hydrOXYzine 25 MG tablet Commonly known as: ATARAX Take 25 mg by mouth 3 (three) times daily as needed for anxiety (sleep). May use in place of quetiapine for sleep.   ibuprofen 800 MG  tablet Commonly known as: ADVIL Take 1 tablet (800 mg total) by mouth every 8 (eight) hours as needed.   Insulin Pen Needle 32G X 4 MM Misc Use as Levemir pen as directed   Levemir FlexPen 100 UNIT/ML FlexPen Generic drug: insulin detemir Inject 40 Units into the skin at bedtime.   Levonorgestrel-Ethinyl Estradiol 0.15-0.03 &0.01 MG tablet Commonly known as: AMETHIA Take 1 tablet by mouth at bedtime.   NovoLOG FlexPen 100 UNIT/ML FlexPen Generic drug: insulin aspart Inject 15 units subcutaneously daily, plus a sliding scale with meals. Max daily dose of 30 units.   oxyCODONE 5 MG immediate release tablet Commonly known as: Oxy IR/ROXICODONE Take 1 tablet (5 mg total) by mouth every 6 (six) hours as needed for severe pain.   QUEtiapine 25 MG tablet Commonly known as: SEROQUEL Take 25 mg by mouth 2 (two) times daily as needed (anxiety, difficulty sleeping).        Follow-up Information     Kandis Cocking, MD. Schedule an appointment as soon as possible for a visit in 2 week(s).   Specialty: General Surgery Why: s/p laparoscopoic appendectomy Contact information: [address] [place] [address] [phone]                Discharge Exam: Ceasar Mons Weights   01/23/23 0757  Weight: 77.1 kg   GEN: NAD SKIN: Warm and dry EYES: No pallor or icterus ENT: MMM CV: RRR PULM: CTA B ABD: soft, obese, appropriately tender, small surgical incision below umbilicus is clean, dry and intact, +BS CNS: AAO x 3, non focal EXT: No edema or tenderness   Condition at discharge: good  The results of significant diagnostics from this hospitalization (including imaging, microbiology, ancillary and laboratory) are listed below for reference.   Imaging Studies: US Abdomen Limited RUQ (LIVER/GB)  Result Date: 01/23/2023 CLINICAL DATA:  Cholelithiasis. EXAM: ULTRASOUND ABDOMEN LIMITED RIGHT UPPER QUADRANT COMPARISON:  None Available. FINDINGS: Gallbladder: Small  shadowing echogenic gallstones are seen within the dependent portion of the gallbladder lumen. The largest measures 1.2 cm. There is no evidence of gallbladder wall thickening (1.9 mm). No sonographic Murphy sign noted by sonographer. Common bile duct: Diameter: 6.3 mm Liver: No focal lesion identified. Diffusely increased echogenicity of the liver parenchyma is noted. Portal vein is patent on color Doppler imaging with normal direction of blood flow towards the liver. Other: None. IMPRESSION: 1. Cholelithiasis, without evidence of acute cholecystitis. 2. Hepatic steatosis without focal liver lesions. Electronically Signed   By: Aram Candela M.D.   On: 01/23/2023 02:45   CT Renal Stone Study  Result Date: 01/22/2023 CLINICAL DATA:  Right lower abdominal pain with nausea and vomiting. Pain radiates to the back. EXAM: CT ABDOMEN AND PELVIS WITHOUT CONTRAST TECHNIQUE: Multidetector CT imaging of the abdomen and pelvis was performed following the standard protocol without IV contrast. RADIATION DOSE REDUCTION: This exam was performed according to the departmental dose-optimization program which includes automated exposure control,  adjustment of the mA and/or kV according to patient size and/or use of iterative reconstruction technique. COMPARISON:  CT 09/23/2013 FINDINGS: Lower chest: No acute abnormality. Hepatobiliary: Hepatic steatosis. Cholelithiasis. No evidence of cholecystitis. No biliary dilation. Pancreas: Unremarkable. Spleen: Unremarkable. Adrenals/Urinary Tract: Normal adrenal glands. No urinary calculi or hydronephrosis. Bladder is unremarkable. Stomach/Bowel: Normal caliber large and small bowel. No bowel wall thickening. Stomach is within normal limits. The appendix measures 7 mm in diameter. Mild adjacent fat stranding (circa series 6/image 46). No evidence of perforation or abscess. Vascular/Lymphatic: No significant vascular findings are present. No enlarged abdominal or pelvic lymph nodes.  Reproductive: Unremarkable. Other: No free intraperitoneal fluid or air. Musculoskeletal: No acute fracture. IMPRESSION: 1. Possible early acute uncomplicated appendicitis. 2. Cholelithiasis without evidence of cholecystitis. 3. Hepatic steatosis. Electronically Signed   By: Minerva Fester M.D.   On: 01/22/2023 20:55    Microbiology: Results for orders placed or performed during the hospital encounter of 10/17/20  Resp Panel by RT-PCR (Flu A&B, Covid) Nasopharyngeal Swab     Status: None   Collection Time: 10/17/20  4:54 AM   Specimen: Nasopharyngeal Swab; Nasopharyngeal(NP) swabs in vial transport medium  Result Value Ref Range Status   SARS Coronavirus 2 by RT PCR NEGATIVE NEGATIVE Final    Comment: (NOTE) SARS-CoV-2 target nucleic acids are NOT DETECTED.  The SARS-CoV-2 RNA is generally detectable in upper respiratory specimens during the acute phase of infection. The lowest concentration of SARS-CoV-2 viral copies this assay can detect is 138 copies/mL. A negative result does not preclude SARS-Cov-2 infection and should not be used as the sole basis for treatment or other patient management decisions. A negative result may occur with  improper specimen collection/handling, submission of specimen other than nasopharyngeal swab, presence of viral mutation(s) within the areas targeted by this assay, and inadequate number of viral copies(<138 copies/mL). A negative result must be combined with clinical observations, patient history, and epidemiological information. The expected result is Negative.  Fact Sheet for Patients:  BloggerCourse.com  Fact Sheet for Healthcare Providers:  SeriousBroker.it  This test is no t yet approved or cleared by the [place] FDA and  has been authorized for detection and/or diagnosis of SARS-CoV-2 by FDA under an Emergency Use Authorization (EUA). This EUA will remain  in effect (meaning this test  can be used) for the duration of the COVID-19 declaration under Section 564(b)(1) of the Act, 21 U.S.C.section 360bbb-3(b)(1), unless the authorization is terminated  or revoked sooner.       Influenza A by PCR NEGATIVE NEGATIVE Final   Influenza B by PCR NEGATIVE NEGATIVE Final    Comment: (NOTE) The Xpert Xpress SARS-CoV-2/FLU/RSV plus assay is intended as an aid in the diagnosis of influenza from Nasopharyngeal swab specimens and should not be used as a sole basis for treatment. Nasal washings and aspirates are unacceptable for Xpert Xpress SARS-CoV-2/FLU/RSV testing.  Fact Sheet for Patients: BloggerCourse.com  Fact Sheet for Healthcare Providers: SeriousBroker.it  This test is not yet approved or cleared by the [place] FDA and has been authorized for detection and/or diagnosis of SARS-CoV-2 by FDA under an Emergency Use Authorization (EUA). This EUA will remain in effect (meaning this test can be used) for the duration of the COVID-19 declaration under Section 564(b)(1) of the Act, 21 U.S.C. section 360bbb-3(b)(1), unless the authorization is terminated or revoked.  Performed at [facility], [address] [place], [place] [address]     Labs: CBC: Recent Labs  Lab 01/22/23  1709 01/23/23 0428  WBC 12.9* 7.9  HGB 15.6* 13.2  HCT 44.6 37.4  MCV 84.2 85.2  PLT 354 293   Basic Metabolic Panel: Recent Labs  Lab 01/22/23 1709 01/23/23 0428  NA 134* 135  K 3.3* 3.2*  CL 98 104  CO2 20* 22  GLUCOSE 293* 286*  BUN 13 14  CREATININE 0.54 0.43*  CALCIUM 8.9 7.6*   Liver Function Tests: Recent Labs  Lab 01/22/23 1709 01/23/23 0428  AST 71* 38  ALT 117* 86*  ALKPHOS 101 76  BILITOT 0.9 1.2  PROT 7.8 6.3*  ALBUMIN 4.1 3.2*   CBG: Recent Labs  Lab 01/23/23 0802 01/23/23 1107 01/23/23 1159  GLUCAP 301* 287* 250*    Discharge time spent: greater than 30 minutes.  Signed: Lurene Shadow, MD [facility] 01/23/2023

## 2023-01-23 NOTE — Assessment & Plan Note (Signed)
Blood glucose 293 with anion gap 16 bicarb 20 but normal beta hydroxybutyric acid and venous pH DKA not suspected IV hydration Basal insulin and sliding scale insulin

## 2023-01-23 NOTE — ED Notes (Signed)
US at bedside

## 2023-01-23 NOTE — Assessment & Plan Note (Signed)
Discussed with surgeon, Dr. Maurine Minister N.p.o. from midnight IV Zosyn IV fluids, IV antiemetics and IV pain meds

## 2023-01-23 NOTE — Discharge Instructions (Addendum)
In addition to included general post-operative instructions,  Diet: Resume home diet.   Activity: No heavy lifting >20 pounds (children, pets, laundry, garbage) or strenuous activity for 4 weeks, but light activity and walking are encouraged. Do not drive or drink alcohol if taking narcotic pain medications or having pain that might distract from driving.  Wound care: 2 days after surgery (09/29), you may shower/get incision wet with soapy water and pat dry (do not rub incisions), but no baths or submerging incision underwater until follow-up.   Medications: Resume all home medications. For mild to moderate pain: acetaminophen (Tylenol) or ibuprofen/naproxen (if no kidney disease). Combining Tylenol with alcohol can substantially increase your risk of causing liver disease. Narcotic pain medications, if prescribed, can be used for severe pain, though may cause nausea, constipation, and drowsiness. Do not combine Tylenol and Percocet (or similar) within a 6 hour period as Percocet (and similar) contain(s) Tylenol. If you do not need the narcotic pain medication, you do not need to fill the prescription.  Call office [phone] / [phone]) at any time if any questions, worsening pain, fevers/chills, bleeding, drainage from incision site, or other concerns.   AMBULATORY SURGERY  DISCHARGE INSTRUCTIONS   The drugs that you were given will stay in your system until tomorrow so for the next 24 hours you should not:  Drive an automobile Make any legal decisions Drink any alcoholic beverage   You may resume regular meals tomorrow.  Today it is better to start with liquids and gradually work up to solid foods.  You may eat anything you prefer, but it is better to start with liquids, then soup and crackers, and gradually work up to solid foods.   Please notify your doctor immediately if you have any unusual bleeding, trouble breathing, redness and pain at the surgery site, drainage, fever,  or pain not relieved by medication.    Additional Instructions: PLEASE LEAVE TEAL BRACELET ON FOR 4 DAYS (MAY REMOVE MONDAY)        Please contact your physician with any problems or Same Day Surgery at [phone], Monday through Friday 6 am to 4 pm, or [place] at [facility] number at [phone].

## 2023-01-23 NOTE — H&P (Incomplete)
History and Physical    Patient: Madison Harrell GHW:[id number] DOB: 17-Feb-1991 DOA: 01/22/2023 DOS: the patient was seen and examined on 01/23/2023 PCP: [facility]  Patient coming from: Home  Chief Complaint:  Chief Complaint  Patient presents with  . Abdominal Pain    HPI: Madison Harrell is a 32 y.o. female with medical history significant for Type 1 diabetes and bipolar mood disorder who presents to the ED with right lower quadrant pain associated with nausea and vomiting as well as urinary urgency.  Denies diarrhea.  Symptoms started abruptly on the morning of arrival. ED course and data review:Afebrile, tachycardic to 116 mildly tachypneic on arrival.  BP initially normal at 136/93 becoming soft down to 99/66 by the time of admission Labs: WBC 12,000, hemoglobin 15 Lipase 26 with AST/ALT 71/117 Glucose 293 with anion gap 16, bicarb 20, venous pH 7.46 with beta hydroxybutyric acid 0.09 Urinalysis with positive nitrites negative leuks normal WBC and rare bacteria Pregnancy test negative CT renal stone study showing possible acute uncomplicated appendicitis among other findings as follows: IMPRESSION: 1. Possible early acute uncomplicated appendicitis. 2. Cholelithiasis without evidence of cholecystitis. 3. Hepatic steatosis.    The ED provider spoke with surgeon Dr. Maurine Minister who requested medicine admission Patient treated with ceftriaxone, morphine and Zofran and Toradol Hospitalist consulted for admission.   Review of Systems: As mentioned in the history of present illness. All other systems reviewed and are negative.  Past Medical History:  Diagnosis Date  . Diabetes mellitus without complication (HCC)   . Hypertension   . pre E    Past Surgical History:  Procedure Laterality Date  . CESAREAN SECTION  2013  . WISDOM TOOTH EXTRACTION     four;   Social History:  reports that she has quit smoking. Her smoking use included cigarettes. She has never used  smokeless tobacco. She reports that she does not drink alcohol and does not use drugs.  No Known Allergies  Family History  Problem Relation Age of Onset  . Diabetes Mother   . Diabetes Father   . Asthma Sister   . Diabetes Brother   . Diabetes Maternal Grandmother   . Diabetes Paternal Grandmother   . Diabetes Paternal Grandfather     Prior to Admission medications   Medication Sig Start Date End Date Taking? Authorizing Provider  ergocalciferol (VITAMIN D2) 1.25 MG (50000 UT) capsule Take 50,000 Units by mouth once a week. Takes on Wednesdays.   Yes [provider]  escitalopram (LEXAPRO) 10 MG tablet Take 25 mg by mouth daily. 01/13/23 02/27/23 Yes [provider]  hydrOXYzine (ATARAX) 25 MG tablet Take 25 mg by mouth 3 (three) times daily as needed for anxiety (sleep). May use in place of quetiapine for sleep. 11/18/22 02/16/23 Yes [provider]  insulin aspart (NOVOLOG FLEXPEN) 100 UNIT/ML FlexPen Inject 15 units subcutaneously daily, plus a sliding scale with meals. Max daily dose of 30 units.   Yes [provider]  Levonorgestrel-Ethinyl Estradiol (AMETHIA) 0.15-0.03 &0.01 MG tablet Take 1 tablet by mouth at bedtime. 10/01/22  Yes Free, Sarah J, CNM  QUEtiapine (SEROQUEL) 25 MG tablet Take 25 mg by mouth 2 (two) times daily as needed (anxiety, difficulty sleeping). 11/18/22  Yes [provider]    Physical Exam: Vitals:   01/22/23 2130 01/22/23 2200 01/22/23 2230 01/22/23 2300  BP: (!) 140/85 (!) 118/35 105/64 99/66  Pulse: 92 95 90 91  Resp: 18     Temp: 98.9 F (37.2  C)     TempSrc:      SpO2: 99% 94% 98% 99%   Physical Exam  Labs on Admission: I have personally reviewed following labs and imaging studies  CBC: Recent Labs  Lab 01/22/23 1709  WBC 12.9*  HGB 15.6*  HCT 44.6  MCV 84.2  PLT 354   Basic Metabolic Panel: Recent Labs  Lab 01/22/23 1709  NA 134*  K 3.3*  CL 98  CO2 20*  GLUCOSE 293*  BUN 13   CREATININE 0.54  CALCIUM 8.9   GFR: CrCl cannot be calculated (Unknown ideal weight.). Liver Function Tests: Recent Labs  Lab 01/22/23 1709  AST 71*  ALT 117*  ALKPHOS 101  BILITOT 0.9  PROT 7.8  ALBUMIN 4.1   Recent Labs  Lab 01/22/23 1709  LIPASE 26   No results for input(s): "AMMONIA" in the last 168 hours. Coagulation Profile: No results for input(s): "INR", "PROTIME" in the last 168 hours. Cardiac Enzymes: No results for input(s): "CKTOTAL", "CKMB", "CKMBINDEX", "TROPONINI" in the last 168 hours. BNP (last 3 results) No results for input(s): "PROBNP" in the last 8760 hours. HbA1C: No results for input(s): "HGBA1C" in the last 72 hours. CBG: No results for input(s): "GLUCAP" in the last 168 hours. Lipid Profile: No results for input(s): "CHOL", "HDL", "LDLCALC", "TRIG", "CHOLHDL", "LDLDIRECT" in the last 72 hours. Thyroid Function Tests: No results for input(s): "TSH", "T4TOTAL", "FREET4", "T3FREE", "THYROIDAB" in the last 72 hours. Anemia Panel: No results for input(s): "VITAMINB12", "FOLATE", "FERRITIN", "TIBC", "IRON", "RETICCTPCT" in the last 72 hours. Urine analysis:    Component Value Date/Time   COLORURINE YELLOW (A) 01/22/2023 1709   APPEARANCEUR CLEAR (A) 01/22/2023 1709   APPEARANCEUR Hazy 09/23/2013 0918   LABSPEC 1.039 (H) 01/22/2023 1709   LABSPEC 1.031 09/23/2013 0918   PHURINE 5.0 01/22/2023 1709   GLUCOSEU >=500 (A) 01/22/2023 1709   GLUCOSEU >=500 09/23/2013 0918   HGBUR NEGATIVE 01/22/2023 1709   BILIRUBINUR NEGATIVE 01/22/2023 1709   BILIRUBINUR neg 10/28/2019 1128   BILIRUBINUR Negative 09/23/2013 0918   KETONESUR 80 (A) 01/22/2023 1709   PROTEINUR 100 (A) 01/22/2023 1709   UROBILINOGEN negative (A) 10/28/2019 1128   NITRITE POSITIVE (A) 01/22/2023 1709   LEUKOCYTESUR NEGATIVE 01/22/2023 1709   LEUKOCYTESUR 3+ 09/23/2013 0918    Radiological Exams on Admission: CT Renal Stone Study  Result Date: 01/22/2023 CLINICAL DATA:  Right  lower abdominal pain with nausea and vomiting. Pain radiates to the back. EXAM: CT ABDOMEN AND PELVIS WITHOUT CONTRAST TECHNIQUE: Multidetector CT imaging of the abdomen and pelvis was performed following the standard protocol without IV contrast. RADIATION DOSE REDUCTION: This exam was performed according to the departmental dose-optimization program which includes automated exposure control, adjustment of the mA and/or kV according to patient size and/or use of iterative reconstruction technique. COMPARISON:  CT 09/23/2013 FINDINGS: Lower chest: No acute abnormality. Hepatobiliary: Hepatic steatosis. Cholelithiasis. No evidence of cholecystitis. No biliary dilation. Pancreas: Unremarkable. Spleen: Unremarkable. Adrenals/Urinary Tract: Normal adrenal glands. No urinary calculi or hydronephrosis. Bladder is unremarkable. Stomach/Bowel: Normal caliber large and small bowel. No bowel wall thickening. Stomach is within normal limits. The appendix measures 7 mm in diameter. Mild adjacent fat stranding (circa series 6/image 46). No evidence of perforation or abscess. Vascular/Lymphatic: No significant vascular findings are present. No enlarged abdominal or pelvic lymph nodes. Reproductive: Unremarkable. Other: No free intraperitoneal fluid or air. Musculoskeletal: No acute fracture. IMPRESSION: 1. Possible early acute uncomplicated appendicitis. 2. Cholelithiasis without evidence of cholecystitis. 3. Hepatic steatosis.  Electronically Signed   By: Minerva Fester M.D.   On: 01/22/2023 20:55     Data Reviewed: Relevant notes from primary care and specialist visits, past discharge summaries as available in EHR, including Care Everywhere. Prior diagnostic testing as pertinent to current admission diagnoses Updated medications and problem lists for reconciliation ED course, including vitals, labs, imaging, treatment and response to treatment Triage notes, nursing and pharmacy notes and ED provider's notes Notable  results as noted in HPI   Assessment and Plan: No notes have been filed under this hospital service. Service: Hospitalist       DVT prophylaxis: Lovenox***  Consults: none***  Advance Care Planning:   Code Status: Prior ***  Family Communication: none***  Disposition Plan: Back to previous home environment  Severity of Illness: {Observation/Inpatient:21159}  Author: Andris Baumann, MD 01/23/2023 12:03 AM  For on call review [email or web address].

## 2023-01-23 NOTE — Op Note (Addendum)
General Surgery  Preop diagnosis: Appendicitis Postop diagnosis: Acute appendicitis with focal peritonitis without perforation or abscess Procedure laparoscopic appendectomy Specimen: Appendix Case classification: Clean contaminated Surgeon: Baker Pierini MD Assistant: Laqueta Due PA-c  After informed consent was obtained the patient was brought to the operating room placed supine on the operating room table.  General endotracheal anesthesia was induced and the patient's abdomen was then prepped and draped in the usual sterile fashion after insertion of a Foley catheter.  A surgical timeout was called identifying correct patient, site, side and procedure.  An incision was made at Palmer's point and the Veress needle was inserted into the abdomen using standard drop technique.  Pneumoperitoneum was then established.  A 5 mm Optiview trocar was placed just inferior to the umbilicus.  There is no injuries noted at the site of the veress needle.  There was omentum adhesed to the anterior abdominal wall right at the level of the umbilicus.  Some of these adhesions were taken down sharply and with cautery.  We then placed a 12 mm left lower quadrant port under direct visualization.  Next we placed a 5 mm suprapubic port under direct visualization.  Given that there were midline adhesions we elected to place another 5 mm left upper quadrant port to aid in dissection.  The small bowel was then swept out of the right lower quadrant and the appendix was identified.  The base of the appendix did appear to be inflamed.  There was no evidence of perforation.  The appendix was lifted and a window was created at the base of the appendix.  The appendix was then taken with a 45 mm blue load stapler.  The mesoappendix was then taken with a 45 mm white vascular load stapler.  The appendix was placed in an Endo Catch bag.  We did note some bleeding at the mesoappendix staple line.  This was controlled with several 10 mm  clips.  The right lower quadrant was then suctioned out of blood and irrigated with warm saline.  We also ensured that where we had taken down some anterior abdominal wall adhesions that there was no evidence of any ongoing bleeding.  The appendix was then removed and passed off the table as specimen.  The fascia of the left lower quadrant 12 mm port was then closed with a 0 Vicryl using a suture needle passer.  The skin was then closed with 4-0 Monocryl and dressed with Dermabond.  The patient was awoken from general endotracheal anesthesia and taken to the recovery unit in good condition.  Prior to closure all sponge and instrument counts were correct x2  Throughout the case the assistance of Laqueta Due PA-C was needed for camera visualization and closure.   Baker Pierini, M.D.

## 2023-01-23 NOTE — Inpatient Diabetes Management (Addendum)
Inpatient Diabetes Program Recommendations  AACE/ADA: New Consensus Statement on Inpatient Glycemic Control   Target Ranges:  Prepandial:   less than 140 mg/dL      Peak postprandial:   less than 180 mg/dL (1-2 hours)      Critically ill patients:  140 - 180 mg/dL    Latest Reference Range & Units 01/23/23 08:02 01/23/23 09:20  Glucose-Capillary 70 - 99 mg/dL 161 (H)  Novolog 7 units     Decadron 10 mg    Latest Reference Range & Units 01/22/23 17:09 01/23/23 03:33 01/23/23 04:28  Glucose 70 - 99 mg/dL 096 (H)    Semglee 20 units 286 (H)  Hemoglobin A1C 4.8 - 5.6 %   12.1 (H)   Review of Glycemic Control  Diabetes history: DM2 hx Outpatient Diabetes medications: Novolog  Current orders for Inpatient glycemic control: Semglee 20 units QHS  Inpatient Diabetes Program Recommendations:    Insulin: Please consider ordering Novolog 0-15 units Q4H.  HbgA1C: A1C 12.1% on 01/23/23 indicating an average glucose of 301 mg/dl over the past 2-3 months.  NOTE: Patient admitted with acute appendicitis and initial lab glucose 293 mg/dl on 0/45/40.  In reviewing chart, noted patient seen PCP on 11/24/22 and per office note patient was taking Metformin 1000 mg BID and using an insulin pump for DM control.  Noted patient last seen Endocrinology on 07/16/22 and per office note by Dr. Gershon Crane,  "She is on Humalog via the OP5 pump. She is also on MTF 1000 mg bid. She went off Trulicity 1.5 mg weekly as she was pregnant. Basal rates MN = 1.2 TDD basal: 28.8 units/24 hours  Bolus settings I/C: 8 ISF: 40 Target Glucose: 110 Active insulin time: 4 hours"  Patient is currently in OR so not able to determine if patient is still using an insulin pump outpatient or not. Will plan to follow up with patient when she gets back on unit following surgery.  Addendum 01/23/23@13 :30-Spoke with patient in PACU. Plan is for patient to be discharged home from PACU. Patient states that she was using OmniPod  insulin pump and Dexcom CGM for DM control but notes she was out of OmniPod pods due to an insurance issue and they are suppose to be ready for her to pick up at her pharmacy today when she is discharged. Patient states that since she ran out of OmniPod pods she was using only Novolog because she ran out of Levemir insulin.  Patient states she was taking 40-42 units daily of Levemir.  Patient states that she uses the auto mode on her OmniPod insulin pump and settings should be as noted above from Dr.O'Connell's note because she does not make any changes to it. Patient reports that her glucose has been running high lately. Discussed A1C of 12.1% and need to keep glucose well controlled following surgery to decrease risk of complications. Informed patient that she received Semglee 20 units at 3:33 am today which will be working for 24 hours. Also informed patient that she received Decadron 10 mg at 9:20 which is going to contribute to hyperglycemia. Discussed using SQ insulin today via injections and consider resuming insulin pump in the morning. Asked patient to check glucose more frequently and use Novolog correction when needed. Patient states she plans to reach out to Dr. Elberta Fortis office to ask about making changes with insulin pump settings to get her DM under better control. Patient states she would be appreciative of getting Rx for Levemir and insulin pen  needles.   Informed patient that I would ask attending provider to send in Rx for Levemir pens and pen needles. Patient verbalized understanding and has no questions at this time.  Thanks, Orlando Penner, RN, MSN, CDCES Diabetes Coordinator Inpatient Diabetes Program [phone] (Team Pager from 8am to 5pm)

## 2023-01-23 NOTE — Anesthesia Procedure Notes (Signed)
Procedure Name: Intubation Date/Time: 01/23/2023 9:10 AM  Performed by: Joanette Gula, Taygan Connell, CRNAPre-anesthesia Checklist: Patient identified, Emergency Drugs available, Suction available and Patient being monitored Patient Re-evaluated:Patient Re-evaluated prior to induction Oxygen Delivery Method: Circle system utilized Preoxygenation: Pre-oxygenation with 100% oxygen Induction Type: IV induction, Rapid sequence and Cricoid Pressure applied Laryngoscope Size: McGraph and 3 Grade View: Grade I Tube type: Oral Number of attempts: 1 Airway Equipment and Method: Stylet and Oral airway Placement Confirmation: ETT inserted through vocal cords under direct vision, positive ETCO2 and breath sounds checked- equal and bilateral Tube secured with: Tape Dental Injury: Teeth and Oropharynx as per pre-operative assessment

## 2023-01-23 NOTE — Assessment & Plan Note (Signed)
Abnormal UA Follow cultures Patient already on antibiotics for appendicitis

## 2023-01-23 NOTE — Assessment & Plan Note (Addendum)
Hepatic steatosis Patient with elevated LFTs, normal lipase Cholelithiasis without cholecystitis on CT Will get right upper quadrant ultrasound--> cholelithiasis without cholecystitis and hepatic steatosis

## 2023-01-23 NOTE — Anesthesia Postprocedure Evaluation (Signed)
Anesthesia Post Note  Patient: Madison Harrell  Procedure(s) Performed: APPENDECTOMY LAPAROSCOPIC (Abdomen)  Patient location during evaluation: PACU Anesthesia Type: General Level of consciousness: awake Pain management: satisfactory to patient Vital Signs Assessment: post-procedure vital signs reviewed and stable Respiratory status: nonlabored ventilation and spontaneous breathing Cardiovascular status: blood pressure returned to baseline Anesthetic complications: no   There were no known notable events for this encounter.   Last Vitals:  Vitals:   01/23/23 1108 01/23/23 1115  BP:  (!) 98/57  Pulse: 79 79  Resp: 18 18  Temp: 36.6 C   SpO2: 100% 99%    Last Pain:  Vitals:   01/23/23 1105  TempSrc:   PainSc: Asleep                 VAN STAVEREN,Kellianne Ek

## 2023-01-23 NOTE — H&P (Signed)
History and Physical    Patient: Madison Harrell WFU:[id number] DOB: 1990/11/11 DOA: 01/22/2023 DOS: the patient was seen and examined on 01/23/2023 PCP: [facility]  Patient coming from: Home  Chief Complaint:  Chief Complaint  Patient presents with   Abdominal Pain    HPI: Madison Harrell is a 32 y.o. female with medical history significant for Type 1 diabetes and bipolar mood disorder who presents to the ED with right lower quadrant pain associated with nausea and vomiting as well as urinary urgency.  Denies diarrhea.  Symptoms started abruptly on the morning of arrival. ED course and data review:Afebrile, tachycardic to 116 mildly tachypneic on arrival.  BP initially normal at 136/93 becoming soft down to 99/66 by the time of admission Labs: WBC 12,000, hemoglobin 15 Lipase 26 with AST/ALT 71/117 Glucose 293 with anion gap 16, bicarb 20, venous pH 7.46 with beta hydroxybutyric acid 0.09 Urinalysis with positive nitrites negative leuks normal WBC and rare bacteria Pregnancy test negative CT renal stone study showing possible acute uncomplicated appendicitis among other findings as follows: IMPRESSION: 1. Possible early acute uncomplicated appendicitis. 2. Cholelithiasis without evidence of cholecystitis. 3. Hepatic steatosis.    The ED provider spoke with surgeon Dr. Maurine Minister who requested medicine admission Patient treated with ceftriaxone, morphine and Zofran and Toradol Hospitalist consulted for admission.   Review of Systems: As mentioned in the history of present illness. All other systems reviewed and are negative.  Past Medical History:  Diagnosis Date   Diabetes mellitus without complication (HCC)    Hypertension    pre E    Past Surgical History:  Procedure Laterality Date   CESAREAN SECTION  2013   WISDOM TOOTH EXTRACTION     four;   Social History:  reports that she has quit smoking. Her smoking use included cigarettes. She has never used  smokeless tobacco. She reports that she does not drink alcohol and does not use drugs.  No Known Allergies  Family History  Problem Relation Age of Onset   Diabetes Mother    Diabetes Father    Asthma Sister    Diabetes Brother    Diabetes Maternal Grandmother    Diabetes Paternal Grandmother    Diabetes Paternal Grandfather     Prior to Admission medications   Medication Sig Start Date End Date Taking? Authorizing Provider  ergocalciferol (VITAMIN D2) 1.25 MG (50000 UT) capsule Take 50,000 Units by mouth once a week. Takes on Wednesdays.   Yes [provider]  escitalopram (LEXAPRO) 10 MG tablet Take 25 mg by mouth daily. 01/13/23 02/27/23 Yes [provider]  hydrOXYzine (ATARAX) 25 MG tablet Take 25 mg by mouth 3 (three) times daily as needed for anxiety (sleep). May use in place of quetiapine for sleep. 11/18/22 02/16/23 Yes [provider]  insulin aspart (NOVOLOG FLEXPEN) 100 UNIT/ML FlexPen Inject 15 units subcutaneously daily, plus a sliding scale with meals. Max daily dose of 30 units.   Yes [provider]  Levonorgestrel-Ethinyl Estradiol (AMETHIA) 0.15-0.03 &0.01 MG tablet Take 1 tablet by mouth at bedtime. 10/01/22  Yes Free, Sarah J, CNM  QUEtiapine (SEROQUEL) 25 MG tablet Take 25 mg by mouth 2 (two) times daily as needed (anxiety, difficulty sleeping). 11/18/22  Yes [provider]    Physical Exam: Vitals:   01/22/23 2130 01/22/23 2200 01/22/23 2230 01/22/23 2300  BP: (!) 140/85 (!) 118/35 105/64 99/66  Pulse: 92 95 90 91  Resp: 18     Temp: 98.9 F (37.2  C)     TempSrc:      SpO2: 99% 94% 98% 99%   Physical Exam Vitals and nursing note reviewed.  Constitutional:      General: She is not in acute distress.    Comments: Appears uncomfortable, states pain is 8 out of 10  HENT:     Head: Normocephalic and atraumatic.  Cardiovascular:     Rate and Rhythm: Normal rate and regular rhythm.     Heart sounds: Normal heart  sounds.  Pulmonary:     Effort: Pulmonary effort is normal.     Breath sounds: Normal breath sounds.  Abdominal:     Palpations: Abdomen is soft.     Tenderness: There is abdominal tenderness in the right lower quadrant.  Neurological:     Mental Status: Mental status is at baseline.     Labs on Admission: I have personally reviewed following labs and imaging studies  CBC: Recent Labs  Lab 01/22/23 1709  WBC 12.9*  HGB 15.6*  HCT 44.6  MCV 84.2  PLT 354   Basic Metabolic Panel: Recent Labs  Lab 01/22/23 1709  NA 134*  K 3.3*  CL 98  CO2 20*  GLUCOSE 293*  BUN 13  CREATININE 0.54  CALCIUM 8.9   GFR: CrCl cannot be calculated (Unknown ideal weight.). Liver Function Tests: Recent Labs  Lab 01/22/23 1709  AST 71*  ALT 117*  ALKPHOS 101  BILITOT 0.9  PROT 7.8  ALBUMIN 4.1   Recent Labs  Lab 01/22/23 1709  LIPASE 26   No results for input(s): "AMMONIA" in the last 168 hours. Coagulation Profile: No results for input(s): "INR", "PROTIME" in the last 168 hours. Cardiac Enzymes: No results for input(s): "CKTOTAL", "CKMB", "CKMBINDEX", "TROPONINI" in the last 168 hours. BNP (last 3 results) No results for input(s): "PROBNP" in the last 8760 hours. HbA1C: No results for input(s): "HGBA1C" in the last 72 hours. CBG: No results for input(s): "GLUCAP" in the last 168 hours. Lipid Profile: No results for input(s): "CHOL", "HDL", "LDLCALC", "TRIG", "CHOLHDL", "LDLDIRECT" in the last 72 hours. Thyroid Function Tests: No results for input(s): "TSH", "T4TOTAL", "FREET4", "T3FREE", "THYROIDAB" in the last 72 hours. Anemia Panel: No results for input(s): "VITAMINB12", "FOLATE", "FERRITIN", "TIBC", "IRON", "RETICCTPCT" in the last 72 hours. Urine analysis:    Component Value Date/Time   COLORURINE YELLOW (A) 01/22/2023 1709   APPEARANCEUR CLEAR (A) 01/22/2023 1709   APPEARANCEUR Hazy 09/23/2013 0918   LABSPEC 1.039 (H) 01/22/2023 1709   LABSPEC 1.031  09/23/2013 0918   PHURINE 5.0 01/22/2023 1709   GLUCOSEU >=500 (A) 01/22/2023 1709   GLUCOSEU >=500 09/23/2013 0918   HGBUR NEGATIVE 01/22/2023 1709   BILIRUBINUR NEGATIVE 01/22/2023 1709   BILIRUBINUR neg 10/28/2019 1128   BILIRUBINUR Negative 09/23/2013 0918   KETONESUR 80 (A) 01/22/2023 1709   PROTEINUR 100 (A) 01/22/2023 1709   UROBILINOGEN negative (A) 10/28/2019 1128   NITRITE POSITIVE (A) 01/22/2023 1709   LEUKOCYTESUR NEGATIVE 01/22/2023 1709   LEUKOCYTESUR 3+ 09/23/2013 0918    Radiological Exams on Admission: CT Renal Stone Study  Result Date: 01/22/2023 CLINICAL DATA:  Right lower abdominal pain with nausea and vomiting. Pain radiates to the back. EXAM: CT ABDOMEN AND PELVIS WITHOUT CONTRAST TECHNIQUE: Multidetector CT imaging of the abdomen and pelvis was performed following the standard protocol without IV contrast. RADIATION DOSE REDUCTION: This exam was performed according to the departmental dose-optimization program which includes automated exposure control, adjustment of the mA and/or kV according to patient  size and/or use of iterative reconstruction technique. COMPARISON:  CT 09/23/2013 FINDINGS: Lower chest: No acute abnormality. Hepatobiliary: Hepatic steatosis. Cholelithiasis. No evidence of cholecystitis. No biliary dilation. Pancreas: Unremarkable. Spleen: Unremarkable. Adrenals/Urinary Tract: Normal adrenal glands. No urinary calculi or hydronephrosis. Bladder is unremarkable. Stomach/Bowel: Normal caliber large and small bowel. No bowel wall thickening. Stomach is within normal limits. The appendix measures 7 mm in diameter. Mild adjacent fat stranding (circa series 6/image 46). No evidence of perforation or abscess. Vascular/Lymphatic: No significant vascular findings are present. No enlarged abdominal or pelvic lymph nodes. Reproductive: Unremarkable. Other: No free intraperitoneal fluid or air. Musculoskeletal: No acute fracture. IMPRESSION: 1. Possible early acute  uncomplicated appendicitis. 2. Cholelithiasis without evidence of cholecystitis. 3. Hepatic steatosis. Electronically Signed   By: Minerva Fester M.D.   On: 01/22/2023 20:55     Data Reviewed: Relevant notes from primary care and specialist visits, past discharge summaries as available in EHR, including Care Everywhere. Prior diagnostic testing as pertinent to current admission diagnoses Updated medications and problem lists for reconciliation ED course, including vitals, labs, imaging, treatment and response to treatment Triage notes, nursing and pharmacy notes and ED provider's notes Notable results as noted in HPI   Assessment and Plan: * Acute appendicitis Discussed with surgeon, Dr. Maurine Minister N.p.o. from midnight IV Zosyn IV fluids, IV antiemetics and IV pain meds  Uncontrolled type 1 diabetes mellitus with hyperglycemia, with long-term current use of insulin (HCC) Blood glucose 293 with anion gap 16 bicarb 20 but normal beta hydroxybutyric acid and venous pH DKA not suspected IV hydration Basal insulin and sliding scale insulin  Cholelithiasis Hepatic steatosis Patient with elevated LFTs, normal lipase Cholelithiasis without cholecystitis on CT Will get right upper quadrant ultrasound--> cholelithiasis without cholecystitis and hepatic steatosis  Urinary (tract) obstruction Abnormal UA Follow cultures Patient already on antibiotics for appendicitis        DVT prophylaxis: SCD  Consults: Surgery, Dr. Maurine Minister  Advance Care Planning:   Code Status: Prior   Family Communication: none  Disposition Plan: Back to previous home environment  Severity of Illness: The appropriate patient status for this patient is INPATIENT. Inpatient status is judged to be reasonable and necessary in order to provide the required intensity of service to ensure the patient's safety. The patient's presenting symptoms, physical exam findings, and initial radiographic and laboratory data in  the context of their chronic comorbidities is felt to place them at high risk for further clinical deterioration. Furthermore, it is not anticipated that the patient will be medically stable for discharge from the hospital within 2 midnights of admission.   * I certify that at the point of admission it is my clinical judgment that the patient will require inpatient hospital care spanning beyond 2 midnights from the point of admission due to high intensity of service, high risk for further deterioration and high frequency of surveillance required.*  Author: Andris Baumann, MD 01/23/2023 12:03 AM  For on call review [email or web address].

## 2023-01-23 NOTE — Progress Notes (Signed)
Pt. given potassium 40 mEq PO per order. Pt. seen by hospitalist and ok to d/c home per surgeon and hospitalist.

## 2023-01-23 NOTE — Transfer of Care (Signed)
Immediate Anesthesia Transfer of Care Note  Patient: Madison Harrell  Procedure(s) Performed: APPENDECTOMY LAPAROSCOPIC (Abdomen)  Patient Location: PACU  Anesthesia Type:General  Level of Consciousness: drowsy  Airway & Oxygen Therapy: Patient Spontanous Breathing and Patient connected to face mask oxygen  Post-op Assessment: Report given to RN and Post -op Vital signs reviewed and stable  Post vital signs: Reviewed and stable  Last Vitals:  Vitals Value Taken Time  BP 109/64 01/23/23 1103  Temp 36.6 C 01/23/23 1108  Pulse 79 01/23/23 1108  Resp 18 01/23/23 1108  SpO2 100 % 01/23/23 1108  Vitals shown include unfiled device data.  Last Pain:  Vitals:   01/23/23 0757  TempSrc:   PainSc: 7          Complications: There were no known notable events for this encounter.

## 2023-01-23 NOTE — Progress Notes (Signed)
Pharmacy Antibiotic Note  Madison Harrell is a 32 y.o. female admitted on 01/22/2023 with  intra-abdominal infection .  Pharmacy has been consulted for Zosyn dosing.  Plan: Zosyn 3.375g IV q8h (4 hour infusion).     Temp (24hrs), Avg:98.8 F (37.1 C), Min:98.7 F (37.1 C), Max:98.9 F (37.2 C)  Recent Labs  Lab 01/22/23 1709  WBC 12.9*  CREATININE 0.54    CrCl cannot be calculated (Unknown ideal weight.).    No Known Allergies  Antimicrobials this admission:   >>    >>   Dose adjustments this admission:   Microbiology results:  BCx:   UCx:    Sputum:    MRSA PCR:   Thank you for allowing pharmacy to be a part of this patient's care.  Analisa Sledd D 01/23/2023 12:42 AM

## 2023-01-24 ENCOUNTER — Encounter: Payer: Self-pay | Admitting: General Surgery

## 2023-01-26 ENCOUNTER — Telehealth (INDEPENDENT_AMBULATORY_CARE_PROVIDER_SITE_OTHER): Payer: BC Managed Care – PPO | Admitting: General Surgery

## 2023-01-26 DIAGNOSIS — Z09 Encounter for follow-up examination after completed treatment for conditions other than malignant neoplasm: Secondary | ICD-10-CM

## 2023-01-26 DIAGNOSIS — K353 Acute appendicitis with localized peritonitis, without perforation or gangrene: Secondary | ICD-10-CM

## 2023-01-26 LAB — SURGICAL PATHOLOGY

## 2023-01-26 NOTE — Telephone Encounter (Signed)
Called patient and identified her with two patient identifiers (name and date of birth). She reports doing well s/p lap appy (POD 3). She is having soreness at incision sites with bruising. She is tolerating diet and having bowel function. I discussed pathology results with her (copied below). She will call office and schedule follow up appointment.   FINAL DIAGNOSIS        1. Appendix, Other than Incidental,  :       ACUTE APPENDICITIS.

## 2023-02-11 ENCOUNTER — Ambulatory Visit (INDEPENDENT_AMBULATORY_CARE_PROVIDER_SITE_OTHER): Payer: BC Managed Care – PPO | Admitting: Physician Assistant

## 2023-02-11 ENCOUNTER — Telehealth: Payer: Self-pay | Admitting: *Deleted

## 2023-02-11 ENCOUNTER — Encounter: Payer: Self-pay | Admitting: Physician Assistant

## 2023-02-11 VITALS — BP 135/85 | HR 97 | Temp 98.0°F | Ht 59.0 in | Wt 181.2 lb

## 2023-02-11 DIAGNOSIS — K353 Acute appendicitis with localized peritonitis, without perforation or gangrene: Secondary | ICD-10-CM

## 2023-02-11 DIAGNOSIS — Z09 Encounter for follow-up examination after completed treatment for conditions other than malignant neoplasm: Secondary | ICD-10-CM

## 2023-02-11 NOTE — Progress Notes (Unsigned)
[facility] POST-OP OFFICE VISIT  02/12/2023  HPI: Madison Harrell is a 32 y.o. female 19 days s/p laparoscopic appendectomy for acute appendicitis with Dr Maurine Minister, MD  Initially after surgery reported significant incisional soreness for the first 1-2 weeks; now improving Did also have a couple intermittent episodes of nausea/emesis post-op but this has now resolved No fever, chills, or bowel changes Incisions are well healed Ambulating well now No other complaints    Vital signs: BP 135/85 (BP Location: Right Arm, Patient Position: Sitting, Cuff Size: Large)   Pulse 97   Temp 98 F (36.7 C) (Oral)   Ht 4\' 11"  (1.499 m)   Wt 181 lb 3.2 oz (82.2 kg)   LMP 01/15/2023   SpO2 97%   BMI 36.60 kg/m    Physical Exam: Constitutional: Well appearing female, NAD Abdomen: Soft, non-tender, non-distended, no rebound/guarding Skin: Laparoscopic incisions are healing well, no erythema or drainage   Assessment/Plan: This is a 32 y.o. female 19 days s/p laparoscopic appendectomy for acute appendicitis with Dr Maurine Minister, MD   - Doing well now; may have had adynamic ileus immediately post-op but nausea/emesis now ceased  - Pain control prn  - Reviewed wound care recommendation  - Reviewed lifting restrictions; 4 weeks total  - Reviewed surgical pathology; Appendicitis  - She can follow up on as needed basis; She understands to call with questions/concerns  Please note, she did also ask about her cholelithiasis diagnosis as well. She denied any post-prandial pain or RUQ pain at this time. Reviewed signs and symptoms, including but not limited to, RUQ pain, post-prandial pain, nausea/emesis, jaundice, fever, which should precipitate call or presentation to the ED. She is not interested in pursuing any elective cholecystectomy at this time, nor would we offer this for at least a few weeks to allow for recovery from this surgery. She is willing to call in the future should these  become bothersome.   -- Lynden Oxford, PA-C [facility] 02/12/2023, 9:39 AM M-F: 7am - 4pm

## 2023-02-11 NOTE — Patient Instructions (Signed)

## 2023-02-11 NOTE — Telephone Encounter (Signed)
Faxed FMLA to [facility] at [phone]

## 2023-02-19 ENCOUNTER — Other Ambulatory Visit: Payer: Self-pay

## 2023-02-19 ENCOUNTER — Emergency Department: Payer: BC Managed Care – PPO

## 2023-02-19 ENCOUNTER — Emergency Department
Admission: EM | Admit: 2023-02-19 | Discharge: 2023-02-19 | Disposition: A | Discharge status: Home / Self Care | Payer: BC Managed Care – PPO | Attending: Emergency Medicine | Admitting: Emergency Medicine

## 2023-02-19 DIAGNOSIS — R1032 Left lower quadrant pain: Secondary | ICD-10-CM | POA: Diagnosis not present

## 2023-02-19 DIAGNOSIS — T819XXA Unspecified complication of procedure, initial encounter: Secondary | ICD-10-CM | POA: Insufficient documentation

## 2023-02-19 DIAGNOSIS — L03311 Cellulitis of abdominal wall: Secondary | ICD-10-CM | POA: Insufficient documentation

## 2023-02-19 LAB — CBC WITH DIFFERENTIAL/PLATELET
Abs Immature Granulocytes: 0.02 10*3/uL (ref 0.00–0.07)
Basophils Absolute: 0 10*3/uL (ref 0.0–0.1)
Basophils Relative: 0 %
Eosinophils Absolute: 0.1 10*3/uL (ref 0.0–0.5)
Eosinophils Relative: 1 %
HCT: 42.6 % (ref 36.0–46.0)
Hemoglobin: 14.8 g/dL (ref 12.0–15.0)
Immature Granulocytes: 0 %
Lymphocytes Relative: 35 %
Lymphs Abs: 2.3 10*3/uL (ref 0.7–4.0)
MCH: 30.1 pg (ref 26.0–34.0)
MCHC: 34.7 g/dL (ref 30.0–36.0)
MCV: 86.6 fL (ref 80.0–100.0)
Monocytes Absolute: 0.5 10*3/uL (ref 0.1–1.0)
Monocytes Relative: 8 %
Neutro Abs: 3.7 10*3/uL (ref 1.7–7.7)
Neutrophils Relative %: 56 %
Platelets: 369 10*3/uL (ref 150–400)
RBC: 4.92 MIL/uL (ref 3.87–5.11)
RDW: 12.9 % (ref 11.5–15.5)
WBC: 6.7 10*3/uL (ref 4.0–10.5)
nRBC: 0 % (ref 0.0–0.2)

## 2023-02-19 LAB — BASIC METABOLIC PANEL
Anion gap: 12 (ref 5–15)
BUN: 18 mg/dL (ref 6–20)
CO2: 22 mmol/L (ref 22–32)
Calcium: 9.3 mg/dL (ref 8.9–10.3)
Chloride: 101 mmol/L (ref 98–111)
Creatinine, Ser: 0.55 mg/dL (ref 0.44–1.00)
GFR, Estimated: >60 mL/min (ref >60)
Glucose, Bld: 360 mg/dL — ABNORMAL HIGH (ref 70–99)
Potassium: 3.7 mmol/L (ref 3.5–5.1)
Sodium: 135 mmol/L (ref 135–145)

## 2023-02-19 LAB — LACTIC ACID, PLASMA: Lactic Acid, Venous: 2 mmol/L (ref 0.5–1.9)

## 2023-02-19 LAB — HCG, QUANTITATIVE, PREGNANCY: hCG, Beta Chain, Quant, S: <1 m[IU]/mL (ref <5)

## 2023-02-19 LAB — POC URINE PREG, ED: Preg Test, Ur: NEGATIVE

## 2023-02-19 MED ORDER — OXYCODONE-ACETAMINOPHEN 5-325 MG PO TABS
1.0000 | ORAL_TABLET | Freq: Once | ORAL | Status: AC
  Administered 2023-02-19: 1 via ORAL
  Filled 2023-02-19: qty 1

## 2023-02-19 MED ORDER — SULFAMETHOXAZOLE-TRIMETHOPRIM 800-160 MG PO TABS
1.0000 | ORAL_TABLET | Freq: Once | ORAL | Status: AC
  Administered 2023-02-19: 1 via ORAL
  Filled 2023-02-19: qty 1

## 2023-02-19 MED ORDER — OXYCODONE HCL 5 MG PO TABS: 5.0000 mg | ORAL_TABLET | Freq: Three times a day (TID) | ORAL | 0 refills | Status: DC | PRN

## 2023-02-19 MED ORDER — ONDANSETRON HCL 4 MG/2ML IJ SOLN
4.0000 mg | Freq: Once | INTRAMUSCULAR | Status: AC
  Administered 2023-02-19: 4 mg via INTRAVENOUS
  Filled 2023-02-19: qty 2

## 2023-02-19 MED ORDER — MORPHINE SULFATE (PF) 4 MG/ML IV SOLN
4.0000 mg | Freq: Once | INTRAVENOUS | Status: AC
  Administered 2023-02-19: 4 mg via INTRAVENOUS
  Filled 2023-02-19: qty 1

## 2023-02-19 MED ORDER — IOHEXOL 300 MG/ML  SOLN
100.0000 mL | Freq: Once | INTRAMUSCULAR | Status: AC | PRN
  Administered 2023-02-19: 100 mL via INTRAVENOUS

## 2023-02-19 MED ORDER — ONDANSETRON HCL 4 MG PO TABS: 4.0000 mg | ORAL_TABLET | Freq: Three times a day (TID) | ORAL | 0 refills | Status: AC | PRN

## 2023-02-19 MED ORDER — SULFAMETHOXAZOLE-TRIMETHOPRIM 800-160 MG PO TABS
1.0000 | ORAL_TABLET | Freq: Two times a day (BID) | ORAL | 0 refills | Status: AC
Start: 2023-02-19 — End: 2023-03-01

## 2023-02-19 NOTE — ED Notes (Signed)
Patient discharged at this time. Wheeled to lobby by husband. Pt Breathing unlabored speaking in full sentences. Verbalized understanding of all discharge, follow up, and medication teaching. Discharged homed with all belongings.

## 2023-02-19 NOTE — ED Notes (Signed)
ED Provider at bedside. 

## 2023-02-19 NOTE — ED Notes (Signed)
Patient transported to CT 

## 2023-02-19 NOTE — Discharge Instructions (Signed)
Workup shows cellulitis to your abdominal wall.  No evidence of deeper abscess, gas, or other fluid collection.  The rest of your labs are reassuring at this time.  I have sent you on an antibiotic to take for the next 10 days.  You can clean the area with soap and water and place gauze over top of it.  Please call your surgery team to let them know about the cellulitis at one of the port sites so that they are aware.  Please return the emergency department for any significant worsening of pain.

## 2023-02-19 NOTE — ED Provider Notes (Signed)
[facility] Provider Note    Event Date/Time   First MD Initiated Contact with Patient 02/19/23 0315     (approximate)   History   Post-op Problem   HPI Madison Harrell is a 32 y.o. female with recent appendectomy on 01/22/2023 presenting today for left lower quadrant abdominal pain.  Patient states she has had intermittent pain since the appendectomy which is largely been okay.  Over the past 24 hours she had worsening pain at the site of one of the laparoscopic entry points in the left lower quadrant.  New redness and tenderness to the area.  Several hours before arrival to the ED, noticed that it burst with pus coming out of it.  Has had chills but no true fevers.  Intermittent nausea but no vomiting.  Chart review: Patient underwent appendectomy with Dr. Maurine Minister on 01/23/2023.  Most recent outpatient visit with the general surgery team on 02/11/2023 with well-healing postoperative course.     Physical Exam   Triage Vital Signs: ED Triage Vitals  Encounter Vitals Group     BP 02/19/23 0148 (!) 155/98     Systolic BP Percentile --      Diastolic BP Percentile --      Pulse Rate 02/19/23 0148 (!) 113     Resp 02/19/23 0148 20     Temp 02/19/23 0148 98.5 F (36.9 C)     Temp Source 02/19/23 0148 Oral     SpO2 02/19/23 0148 97 %     Weight 02/19/23 0150 179 lb (81.2 kg)     Height 02/19/23 0148 4\' 11"  (1.499 m)     Head Circumference --      Peak Flow --      Pain Score 02/19/23 0148 10     Pain Loc --      Pain Education --      Exclude from Growth Chart --     Most recent vital signs: Vitals:   02/19/23 0500 02/19/23 0530  BP: 138/86 125/85  Pulse: 84 87  Resp: 20 19  Temp:    SpO2: 97% 98%   Physical Exam: I have reviewed the vital signs and nursing notes. General: Awake, alert, no acute distress.  Nontoxic appearing. Head:  Atraumatic, normocephalic.   ENT:  EOM intact, PERRL. Oral mucosa is pink and moist with no lesions. Neck: Neck  is supple with full range of motion, No meningeal signs. Cardiovascular:  RRR, No murmurs. Peripheral pulses palpable and equal bilaterally. Respiratory:  Symmetrical chest wall expansion.  No rhonchi, rales, or wheezes.  Good air movement throughout.  No use of accessory muscles.   Musculoskeletal:  No cyanosis or edema. Moving extremities with full ROM Abdomen:  Soft, distended, tenderness to palpation in left lower quadrant at the site of wound.  Surrounding erythema and pus drainage. Neuro:  GCS 15, moving all four extremities, interacting appropriately. Speech clear. Psych:  Calm, appropriate.   Skin:  Warm, dry, no rash.  Skin findings can be seen in media tab.    ED Results / Procedures / Treatments   Labs (all labs ordered are listed, but only abnormal results are displayed) Labs Reviewed  BASIC METABOLIC PANEL - Abnormal; Notable for the following components:      Result Value   Glucose, Bld 360 (*)    All other components within normal limits  LACTIC ACID, PLASMA - Abnormal; Notable for the following components:   Lactic Acid, Venous 2.0 (*)  All other components within normal limits  CULTURE, BLOOD (ROUTINE X 2)  CULTURE, BLOOD (ROUTINE X 2)  CBC WITH DIFFERENTIAL/PLATELET  HCG, QUANTITATIVE, PREGNANCY  LACTIC ACID, PLASMA  POC URINE PREG, ED     EKG    RADIOLOGY Independently interpreted CT imaging and agree with radiology read   PROCEDURES:  Critical Care performed: No  Procedures   MEDICATIONS ORDERED IN ED: Medications  morphine (PF) 4 MG/ML injection 4 mg (4 mg Intravenous Given 02/19/23 0342)  ondansetron (ZOFRAN) injection 4 mg (4 mg Intravenous Given 02/19/23 0342)  iohexol (OMNIPAQUE) 300 MG/ML solution 100 mL (100 mLs Intravenous Contrast Given 02/19/23 0358)  oxyCODONE-acetaminophen (PERCOCET/ROXICET) 5-325 MG per tablet 1 tablet (1 tablet Oral Given 02/19/23 0540)  sulfamethoxazole-trimethoprim (BACTRIM DS) 800-160 MG per tablet 1 tablet  (1 tablet Oral Given 02/19/23 0540)     IMPRESSION / MDM / ASSESSMENT AND PLAN / ED COURSE  I reviewed the triage vital signs and the nursing notes.                              Differential diagnosis includes, but is not limited to, cellulitis, soft tissue abscess, postoperative abscess.  Patient's presentation is most consistent with acute complicated illness / injury requiring diagnostic workup.  Patient is a 32 year old female presenting today for lower abdominal pain with erythema and drainage at the site of recent laparoscopic surgery entry site.  Appears like cellulitis on the skin but concern for deeper infection concerning with recent surgery.  Tachycardia but other vital signs stable.  Patient given pain and nausea medication.  Laboratory workup largely reassuring at this time with no leukocytosis.  Lactic 2.0 but patient tolerating p.o. fluids and will continue this given vital sign improvement following medications.  CT imaging showed evidence of abdominal wall cellulitis.  No evidence of gas, fluid collection, or abscess.  The spot on the skin has already drained pus out of it and do not think she needs further incision and drainage at this time.  Patient will be started on Bactrim for cellulitis coverage given recent operative intervention.  Vital signs normalized and patient otherwise stable for discharge at this time.  Told to call her surgery team tomorrow during the daytime to discuss any potential follow-up versus just completion of antibiotic treatment.  She was given strict return precautions for any worsening symptoms.  The patient is on the cardiac monitor to evaluate for evidence of arrhythmia and/or significant heart rate changes. Clinical Course as of 02/19/23 0546  Thu Feb 19, 2023  0530 CT ABDOMEN PELVIS W CONTRAST  Two areas of mild ventral abdominal wall inflammation, including at the left lower quadrant laparoscopic port tract where a small superimposed phlegmon is  identified. Overlying skin thickening. The appearance is compatible with Cellulitis. But there is NO gas, fluid collection, abscess. [DW]  0535 Pulse Rate: 87 [DW]    Clinical Course User Index [DW] Janith Lima, MD     FINAL CLINICAL IMPRESSION(S) / ED DIAGNOSES   Final diagnoses:  Cellulitis of abdominal wall     Rx / DC Orders   ED Discharge Orders          Ordered    sulfamethoxazole-trimethoprim (BACTRIM DS) 800-160 MG tablet  2 times daily        02/19/23 0539    oxyCODONE (ROXICODONE) 5 MG immediate release tablet  Every 8 hours PRN        02/19/23  0539    ondansetron (ZOFRAN) 4 MG tablet  Every 8 hours PRN        02/19/23 0539             Note:  This document was prepared using Dragon voice recognition software and may include unintentional dictation errors.   Janith Lima, MD 02/19/23 [phone]

## 2023-02-19 NOTE — ED Triage Notes (Addendum)
Pt had appendix removed 01/23/23 and has had pain redness and swelling to incision site for the past week. Pt states earlier tonight when she sat down the incision opened and she had drainage of pus and blood. Site red, inflamed and has small opening.

## 2023-02-24 LAB — CULTURE, BLOOD (ROUTINE X 2)
Culture: NO GROWTH
Culture: NO GROWTH
Special Requests: ADEQUATE
Special Requests: ADEQUATE

## 2023-07-01 ENCOUNTER — Emergency Department
Admission: EM | Admit: 2023-07-01 | Discharge: 2023-07-01 | Disposition: A | Discharge status: Home / Self Care | Attending: Emergency Medicine | Admitting: Emergency Medicine

## 2023-07-01 ENCOUNTER — Other Ambulatory Visit: Payer: Self-pay

## 2023-07-01 ENCOUNTER — Emergency Department

## 2023-07-01 DIAGNOSIS — R109 Unspecified abdominal pain: Secondary | ICD-10-CM | POA: Diagnosis present

## 2023-07-01 DIAGNOSIS — E109 Type 1 diabetes mellitus without complications: Secondary | ICD-10-CM | POA: Diagnosis not present

## 2023-07-01 DIAGNOSIS — I10 Essential (primary) hypertension: Secondary | ICD-10-CM | POA: Insufficient documentation

## 2023-07-01 DIAGNOSIS — E871 Hypo-osmolality and hyponatremia: Secondary | ICD-10-CM | POA: Diagnosis not present

## 2023-07-01 DIAGNOSIS — K8020 Calculus of gallbladder without cholecystitis without obstruction: Secondary | ICD-10-CM | POA: Diagnosis not present

## 2023-07-01 LAB — BASIC METABOLIC PANEL
Anion gap: 8 (ref 5–15)
BUN: 17 mg/dL (ref 6–20)
CO2: 22 mmol/L (ref 22–32)
Calcium: 9.3 mg/dL (ref 8.9–10.3)
Chloride: 103 mmol/L (ref 98–111)
Creatinine, Ser: 0.49 mg/dL (ref 0.44–1.00)
GFR, Estimated: >60 mL/min (ref >60)
Glucose, Bld: 217 mg/dL — ABNORMAL HIGH (ref 70–99)
Potassium: 3.6 mmol/L (ref 3.5–5.1)
Sodium: 133 mmol/L — ABNORMAL LOW (ref 135–145)

## 2023-07-01 LAB — CBC
HCT: 46 % (ref 36.0–46.0)
Hemoglobin: 15.9 g/dL — ABNORMAL HIGH (ref 12.0–15.0)
MCH: 29.2 pg (ref 26.0–34.0)
MCHC: 34.6 g/dL (ref 30.0–36.0)
MCV: 84.4 fL (ref 80.0–100.0)
Platelets: 377 10*3/uL (ref 150–400)
RBC: 5.45 MIL/uL — ABNORMAL HIGH (ref 3.87–5.11)
RDW: 12.9 % (ref 11.5–15.5)
WBC: 5.9 10*3/uL (ref 4.0–10.5)
nRBC: 0 % (ref 0.0–0.2)

## 2023-07-01 LAB — URINALYSIS, ROUTINE W REFLEX MICROSCOPIC
Bilirubin Urine: NEGATIVE
Glucose, UA: >=500 mg/dL — AB
Ketones, ur: NEGATIVE mg/dL
Nitrite: NEGATIVE
Protein, ur: 100 mg/dL — AB
Specific Gravity, Urine: 1.03 (ref 1.005–1.030)
pH: 5 (ref 5.0–8.0)

## 2023-07-01 LAB — POC URINE PREG, ED: Preg Test, Ur: NEGATIVE

## 2023-07-01 LAB — LIPASE, BLOOD: Lipase: 34 U/L (ref 11–51)

## 2023-07-01 MED ORDER — FENTANYL CITRATE PF 50 MCG/ML IJ SOSY
50.0000 ug | PREFILLED_SYRINGE | Freq: Once | INTRAMUSCULAR | Status: AC
  Administered 2023-07-01: 50 ug via INTRAVENOUS
  Filled 2023-07-01: qty 1

## 2023-07-01 MED ORDER — METOCLOPRAMIDE HCL 5 MG/ML IJ SOLN
10.0000 mg | Freq: Once | INTRAMUSCULAR | Status: AC
  Administered 2023-07-01: 10 mg via INTRAVENOUS
  Filled 2023-07-01: qty 2

## 2023-07-01 MED ORDER — IOHEXOL 300 MG/ML  SOLN
100.0000 mL | Freq: Once | INTRAMUSCULAR | Status: AC | PRN
  Administered 2023-07-01: 100 mL via INTRAVENOUS

## 2023-07-01 MED ORDER — MORPHINE SULFATE (PF) 4 MG/ML IV SOLN
4.0000 mg | Freq: Once | INTRAVENOUS | Status: AC
  Administered 2023-07-01: 4 mg via INTRAVENOUS
  Filled 2023-07-01: qty 1

## 2023-07-01 MED ORDER — HYOSCYAMINE SULFATE 0.125 MG SL SUBL: 0.1250 mg | SUBLINGUAL_TABLET | SUBLINGUAL | 0 refills | Status: AC | PRN

## 2023-07-01 MED ORDER — ONDANSETRON HCL 4 MG/2ML IJ SOLN
4.0000 mg | Freq: Once | INTRAMUSCULAR | Status: AC
  Administered 2023-07-01: 4 mg via INTRAVENOUS
  Filled 2023-07-01: qty 2

## 2023-07-01 MED ORDER — CEPHALEXIN 500 MG PO CAPS: 500.0000 mg | ORAL_CAPSULE | Freq: Three times a day (TID) | ORAL | 0 refills | Status: AC

## 2023-07-01 MED ORDER — HYDROMORPHONE HCL 1 MG/ML IJ SOLN
0.5000 mg | Freq: Once | INTRAMUSCULAR | Status: AC
  Administered 2023-07-01: 0.5 mg via INTRAVENOUS
  Filled 2023-07-01: qty 0.5

## 2023-07-01 MED ORDER — SODIUM CHLORIDE 0.9 % IV BOLUS
500.0000 mL | Freq: Once | INTRAVENOUS | Status: AC
  Administered 2023-07-01: 500 mL via INTRAVENOUS

## 2023-07-01 NOTE — ED Notes (Signed)
 Diabetes coordinator checked in on the patient. The concern is that if she is ends up admitted that she will be without her sensor due to likely having to remove it in CT. Coordinator will bring up samples of the other sensor shortly.

## 2023-07-01 NOTE — Inpatient Diabetes Management (Addendum)
 Inpatient Diabetes Program Recommendations  AACE/ADA: New Consensus Statement on Inpatient Glycemic Control   Target Ranges:  Prepandial:   less than 140 mg/dL      Peak postprandial:   less than 180 mg/dL (1-2 hours)      Critically ill patients:  140 - 180 mg/dL    Latest Reference Range & Units 07/01/23 10:18  Glucose 70 - 99 mg/dL 191 (H)    Review of Glycemic Control  Diabetes history: DM2 Outpatient Diabetes medications: OmniPod insulin pump with Dexcom G7 CGM sensor, Mounjaro 7.5 mg Qweek Current orders for Inpatient glycemic control: None; in ED  Inpatient Diabetes Program Recommendations:    Insulin: If patient is admitted, please use Insulin Pump order set to order CBGs and Insulin Pump AC&HS and 2am. Patient would like to use CGM values for glucose readings but agreeable to BID finger stick CBGs. Please also place Nursing Care order (patient is allowed to use CGM glucose readings and nursing staff to do finger stick CBG once a shift).   NOTE: Patient presented to ED today with abdominal pain, nausea, vomiting. In reviewing chart, noted patient sees Dr. Gershon Crane (Endocrinologist) and patient was last seen on 06/19/23. Per office note on 06/19/23, patient is using OmniPod insulin pump and Mounjaro. Per office note, insulin pump settings should be Basal 12A  1.2 units/hr Total Basal: 28.8 units/24 hours Insulin to Carb Ratio 1:6 grams Insulin Sensitivity Factor 1:35 mg/dl  Went by ED at 47:82 to talk with patient and inquire about insulin pump and Mounjaro. Patient was not in the room; gone for CT. Spoke with patient's husband in room and he stated that patient had just put on a new OmniPod and she had Dexcom G7 CGM sensor on. Discussed that they may be removed in CT and if so, we may need to give her SQ insulin until she can get more pump supplies. Ginny, Paramedic responded to chat message and reported that patient still had on OmniPod and Dexcom G7 sensor (it was not removed  prior to CT). Addendum 07/01/23@14 :55-Spoke to patient at bedside in ED. Patient confirms that she still has on OmniPod and Dexcom G7 and current CGM glucose is 157 mg/dl. Patient confirmed pump settings as noted above. Patient uses automode so pump will adjust insulin delivery based on glucose. Patient reports that she has been taking Mounjaro 7.5 mg Qweek for about 4 months. Patient states she last took [facility] yesterday but does not feel that abdominal pain and N/V are related to [facility] as she has tolerated it well at 7.5 mg Qweek without any issues. Discussed that if patient is admitted, it would be recommended that our insulin pump order set be used so patient could be allowed to stay on her insulin pump. Patient would like to use CGM readings for glucose. Discussed that I would make a note to ask attending provider to allow her to use CGM values but nursing staff would need to do BID finger stick CBGs. Patient states she would be agreeable to that and she will calibrate her sensor when finger sticks are done. Patient states she does not have any extra pump supplies here at the hospital. Asked that she have family bring in more OmniPod pods if she gets admitted. Patient does not have any extra Dexcom G7 sensors at home currently. Provided patient with Dexcom G7 sensor sample to have on hand if needed. Patient states her current OmniPod pod has 2 more days before it expires. Patient reports she is  still having abdominal pain and feels bad. Informed patient that if she is admitted, our team will follow along to help ensure DM is managed effectively (preferably with her insulin pump). Patient verbalized understanding of information and has no questions or concerns at this time. Thanks,  Orlando Penner, RN, MSN, CDCES Diabetes Coordinator Inpatient Diabetes Program [phone] (Team Pager from 8am to 5pm)

## 2023-07-01 NOTE — ED Provider Notes (Signed)
 [facility] Provider Note    Event Date/Time   First MD Initiated Contact with Patient 07/01/23 [phone]     (approximate)   History   Flank Pain   HPI  Madison Harrell is a 33 y.o. female with history of Type 1DM, DKA, HTN and as listed in EMR presents to the emergency department for evaluation of left flank pain that radiates into the left abdomen and right upper quadrant. Pain started yesterday and has worsened overnight. She has been vomiting this morning. No fever or diarrhea. She denies similar pain in the past. Abdominal surgical history includes appendectomy and c-section. Blood glucose levels have been well maintained as of late. Last dose of Mounjaro was yesterday. No increase in dose and she denies ever having adverse effects from the medication.       Physical Exam   Triage Vital Signs: ED Triage Vitals [07/01/23 0950]  Encounter Vitals Group     BP (!) 154/114     Systolic BP Percentile      Diastolic BP Percentile      Pulse Rate 93     Resp 20     Temp 97.8 F (36.6 C)     Temp src      SpO2 99 %     Weight 190 lb (86.2 kg)     Height 4\' 11"  (1.499 m)     Head Circumference      Peak Flow      Pain Score 9     Pain Loc      Pain Education      Exclude from Growth Chart     Most recent vital signs: Vitals:   07/01/23 0950 07/01/23 1344  BP: (!) 154/114 (!) 136/104  Pulse: 93 87  Resp: 20 18  Temp: 97.8 F (36.6 C) 97.7 F (36.5 C)  SpO2: 99% 100%    General: Awake, no distress.  CV:  Good peripheral perfusion.  Resp:  Normal effort.  Abd:  No distention.  Other:  Left flank, left lower abdominal tenderness, right upper quadrant tenderness.   ED Results / Procedures / Treatments   Labs (all labs ordered are listed, but only abnormal results are displayed) Labs Reviewed  URINALYSIS, ROUTINE W REFLEX MICROSCOPIC - Abnormal; Notable for the following components:      Result Value   Color, Urine YELLOW (*)     APPearance HAZY (*)    Glucose, UA >=500 (*)    Hgb urine dipstick MODERATE (*)    Protein, ur 100 (*)    Leukocytes,Ua TRACE (*)    Bacteria, UA RARE (*)    All other components within normal limits  CBC - Abnormal; Notable for the following components:   RBC 5.45 (*)    Hemoglobin 15.9 (*)    All other components within normal limits  BASIC METABOLIC PANEL - Abnormal; Notable for the following components:   Sodium 133 (*)    Glucose, Bld 217 (*)    All other components within normal limits  URINE CULTURE  LIPASE, BLOOD  POC URINE PREG, ED     EKG  Not indicated.   RADIOLOGY  Image and radiology report reviewed and interpreted by me. Radiology report consistent with the same.  CT abdomen and pelvis with contrast shows no acute intra-abdominal or pelvic finding. Small calcified gallstones. Nondistended.  PROCEDURES:  Critical Care performed: No  Procedures   MEDICATIONS ORDERED IN ED:  Medications  ondansetron (ZOFRAN) injection 4  mg (4 mg Intravenous Given 07/01/23 1023)  fentaNYL (SUBLIMAZE) injection 50 mcg (50 mcg Intravenous Given 07/01/23 1024)  sodium chloride 0.9 % bolus 500 mL (0 mLs Intravenous Stopped 07/01/23 1141)  HYDROmorphone (DILAUDID) injection 0.5 mg (0.5 mg Intravenous Given 07/01/23 1206)  iohexol (OMNIPAQUE) 300 MG/ML solution 100 mL (100 mLs Intravenous Contrast Given 07/01/23 1230)  morphine (PF) 4 MG/ML injection 4 mg (4 mg Intravenous Given 07/01/23 1342)  metoCLOPramide (REGLAN) injection 10 mg (10 mg Intravenous Given 07/01/23 1341)     IMPRESSION / MDM / ASSESSMENT AND PLAN / ED COURSE   I have reviewed the triage note.  Differential diagnosis includes, but is not limited to, Gastritis, kidney stone, DKA, pancreatitis, cholecystitis, cholelithiasis, pyelonephritis  Patient's presentation is most consistent with acute presentation with potential threat to life or bodily function.  33 year old female presenting to the emergency department for  treatment and evaluation of left flank pain, left upper abdominal pain, and right upper abdominal pain that started yesterday and has progressively worsened.  See HPI for further details.  On exam, she does have left side CVA tenderness and right and left side abdominal pain.  Abdomen is soft.  Bowel sounds present x 4 quadrants.  Vital signs show blood pressure 154/114.  This will be monitored and pain be secondary to pain.  Heart rate, respiratory rate, temperature, and SpO2 are all normal.  Plan will be to medicate with zofran and fentanyl and give her some fluids. If POC pregnancy is negative, plan will be to get a CT of the abdomen and pelvis.  Patient has continued to experience abdominal pain and nausea. Dilaudid given earlier has worn off and she is again complaining of pain and nausea. Morphine and reglan ordered. Awaiting results of CT.  CT unremarkable with exception of gallstones. No biliary obstruction or wall thickening identified. Labs show normal CBC; mild hyponatremia at 133; nonfasting glucose of 217 with normal anion gap; Urinalysis shows rare bacteria, trace leukocytes, 100 of protein, moderate Hgb, over 500 glucose.  Results discussed with patient and family. She will plan to schedule follow up with surgeon if abdominal pain becomes recurrent. Flank pain/UTI treated with Keflex. She is to follow up with primary care if those symptoms are not improving over the week. ER return precautions discussed.      FINAL CLINICAL IMPRESSION(S) / ED DIAGNOSES   Final diagnoses:  Acute left flank pain  Calculus of gallbladder without cholecystitis without obstruction     Rx / DC Orders   ED Discharge Orders          Ordered    hyoscyamine (LEVSIN/SL) 0.125 MG SL tablet  Every 4 hours PRN        07/01/23 1543    cephALEXin (KEFLEX) 500 MG capsule  3 times daily        07/01/23 1543             Note:  This document was prepared using Dragon voice recognition software  and may include unintentional dictation errors.   Chinita Pester, FNP 07/02/23 0454    Janith Lima, MD 07/02/23 [phone]

## 2023-07-01 NOTE — Discharge Instructions (Signed)
 Please call to set up an appointment with the surgeon.  If your pain is worse or if you develop a fever or persistent vomiting, please return to the emergency department.

## 2023-07-01 NOTE — ED Triage Notes (Signed)
 Pt to ED for left sided flank pain with emesis started yesterday. Denies urinary sx.

## 2023-07-01 NOTE — ED Notes (Signed)
 See triage notes. Patient c/o left sided flank pain and vomiting that started yesterday.

## 2023-07-03 LAB — URINE CULTURE: Culture: 1000 — AB

## 2023-07-07 ENCOUNTER — Encounter: Payer: Self-pay | Admitting: General Surgery

## 2023-07-07 ENCOUNTER — Ambulatory Visit (INDEPENDENT_AMBULATORY_CARE_PROVIDER_SITE_OTHER): Admitting: General Surgery

## 2023-07-07 ENCOUNTER — Ambulatory Visit: Payer: Self-pay | Admitting: General Surgery

## 2023-07-07 VITALS — BP 117/75 | HR 92 | Temp 98.2°F | Ht 59.0 in | Wt 190.0 lb

## 2023-07-07 DIAGNOSIS — K8000 Calculus of gallbladder with acute cholecystitis without obstruction: Secondary | ICD-10-CM

## 2023-07-07 DIAGNOSIS — K8020 Calculus of gallbladder without cholecystitis without obstruction: Secondary | ICD-10-CM

## 2023-07-07 DIAGNOSIS — K8050 Calculus of bile duct without cholangitis or cholecystitis without obstruction: Secondary | ICD-10-CM

## 2023-07-07 NOTE — Progress Notes (Signed)
 Patient ID: Madison Harrell, female   DOB: [birth year], 33 y.o.   MRN: [id number] CC: Biliary Colic History of Present Illness Madison Harrell is a 33 y.o. female with history of diabetes type 1 who presents in consultation with biliary colic.  I did a laparoscopic appendectomy on the patient even prior to this she reported episodes of right upper quadrant pain after eating.  However at the time of her appendectomy this was not causing her much pain.  Over the last several days she has had worsening right upper quadrant pain and back pain.  She says that this is associated with nausea and some episodes of vomiting.  This happens every time she eats no matter what type of food it is.  She has not had any fevers or chills.  The pain was so bad that she went to the emergency department for evaluation.  There she had a workup that was significant for symptomatic cholelithiasis.  She reports that since then she is continue to have right upper quadrant pain but it is eased with ibuprofen.  She denies any jaundice.  She denies any diarrhea or constipation.  She is worried she cannot eat because she has diabetes and her sugars have been dropping low.  Past Medical History Past Medical History:  Diagnosis Date   Diabetes mellitus without complication (HCC)    Hypertension    pre E      \  Past Surgical History:  Procedure Laterality Date   CESAREAN SECTION  2013   LAPAROSCOPIC APPENDECTOMY N/A 01/23/2023   Procedure: APPENDECTOMY LAPAROSCOPIC;  Surgeon: Kandis Cocking, MD;  Location: [facility] ORS;  Service: General;  Laterality: N/A;   WISDOM TOOTH EXTRACTION     four;    No Known Allergies  Current Outpatient Medications  Medication Sig Dispense Refill   Continuous Glucose Sensor (DEXCOM G6 SENSOR) MISC Use to monitor blood sugar.  Replace every 10 days     ergocalciferol (VITAMIN D2) 1.25 MG (50000 UT) capsule Take 50,000 Units by mouth once a week. Takes on Wednesdays.     hyoscyamine (LEVSIN/SL)  0.125 MG SL tablet Place 1 tablet (0.125 mg total) under the tongue every 4 (four) hours as needed. 30 tablet 0   ibuprofen (ADVIL) 800 MG tablet Take 1 tablet (800 mg total) by mouth every 8 (eight) hours as needed. 30 tablet 0   insulin aspart (NOVOLOG FLEXPEN) 100 UNIT/ML FlexPen Inject 15 units subcutaneously daily, plus a sliding scale with meals. Max daily dose of 30 units.     Insulin Disposable Pump (OMNIPOD 5 DEXG7G6 PODS GEN 5) MISC CHANGE POD EVERY 3RD DAY     Insulin Pen Needle 32G X 4 MM MISC Use as Levemir pen as directed 100 each 0   Levonorgestrel-Ethinyl Estradiol (AMETHIA) 0.15-0.03 &0.01 MG tablet Take 1 tablet by mouth at bedtime. 84 tablet 3   MOUNJARO 7.5 MG/0.5ML Pen Inject 7.5 mg into the skin once a week. Takes on Tuesday     ondansetron (ZOFRAN) 4 MG tablet Take 1 tablet (4 mg total) by mouth every 8 (eight) hours as needed for nausea. 15 tablet 0   QUEtiapine (SEROQUEL) 25 MG tablet Take 25 mg by mouth 2 (two) times daily as needed (anxiety, difficulty sleeping).     escitalopram (LEXAPRO) 10 MG tablet Take 25 mg by mouth daily.     No current facility-administered medications for this visit.    Family History Family History  Problem Relation Age of Onset   Diabetes  Mother    Diabetes Father    Asthma Sister    Diabetes Brother    Diabetes Maternal Grandmother    Diabetes Paternal Grandmother    Diabetes Paternal Grandfather        Social History Social History   Tobacco Use   Smoking status: Former    Types: Cigarettes    Passive exposure: Past   Smokeless tobacco: Never  Vaping Use   Vaping status: Never Used  Substance Use Topics   Alcohol use: No   Drug use: No        ROS Full ROS of systems performed and is otherwise negative there than what is stated in the HPI  Physical Exam Blood pressure 117/75, pulse 92, temperature 98.2 F (36.8 C), height 4\' 11"  (1.499 m), weight 190 lb (86.2 kg), last menstrual period 06/29/2023, SpO2 98%, not  currently breastfeeding.  Alert and oriented x 3, clear to auscultation bilaterally, regular rate and rhythm, PERRLA, abdomen is soft, obese, laparoscopic port sites are well-healed with some hypertrophic scarring in the left lower quadrant.  She has pain to deep palpation in the epigastric and right upper quadrant region.  There is no rebound tenderness or Murphy sign positivity.  Data Reviewed Reviewed her labs from the ED and they are notable for a normal creatinine.  Her glucose at that time was 217.  She had a normal white count and was polycythemic.  Her CT scan shows a stone within the gallbladder lumen without any pericholecystic fluid or gallbladder wall thickening.  She does not have LFTs  I have personally reviewed the patient's imaging and medical records.    Assessment/Plan    Patient with likely symptomatic cholelithiasis.  She is having a hard time eating secondary to the pain.  On exam she does have some tenderness in the epigastrium and the right upper quadrant.  I discussed with her that I would like to get an ultrasound to ensure that there is no evidence of infection.  We also repeat her CBC and get LFTs to make sure there is no hyperbilirubinemia.  If these are all negative then we will proceed with robotic assisted cholecystectomy on Friday.  I discussed the risk and benefits alternatives of the procedure including risk of infection, bleeding, retained stone, bile leak, injury to the common bile duct and need for open procedure.  She understands these risks and wishes to proceed.  We will use ICG      Kandis Cocking 07/07/2023, 4:11 PM

## 2023-07-07 NOTE — Patient Instructions (Signed)
 We would like for you to have blood work. You may do this at [facility]. Enter in through the Medical Mall entrance and let them know you have labs.  You are scheduled for an Ultrasound of the Gallbladder on 07/08/23 at [facility]. Please arrive at the medical mall entrance and report to the Radiology desk at 8:00 am. You may not have anything to eat or drink after midnight tonight.    You have requested to have your gallbladder removed. This will be done at [facility] with Dr. Maurine Minister.  You will most likely be out of work 1-2 weeks for this surgery.  If you have FMLA or disability paperwork that needs filled out you may drop this off at our office or this can be faxed to [phone].  You will return after your post-op appointment with a lifting restriction for approximately 4 more weeks.  You will be able to eat anything you would like to following surgery. But, start by eating a bland diet and advance this as tolerated. The Gallbladder diet is below, please go as closely by this diet as possible prior to surgery to avoid any further attacks.  Please see the (blue)pre-care form that you have been given today. Our surgery scheduler will call you to verify surgery date and to go over information.   If you have any questions, please call our office.  Laparoscopic Cholecystectomy Laparoscopic cholecystectomy is surgery to remove the gallbladder. The gallbladder is located in the upper right part of the abdomen, behind the liver. It is a storage sac for bile, which is produced in the liver. Bile aids in the digestion and absorption of fats. Cholecystectomy is often done for inflammation of the gallbladder (cholecystitis). This condition is usually caused by a buildup of gallstones (cholelithiasis) in the gallbladder. Gallstones can block the flow of bile, and that can result in inflammation and pain. In severe cases, emergency surgery may be required. If emergency surgery is not required, you  will have time to prepare for the procedure. Laparoscopic surgery is an alternative to open surgery. Laparoscopic surgery has a shorter recovery time. Your common bile duct may also need to be examined during the procedure. If stones are found in the common bile duct, they may be removed. LET [facility] CARE PROVIDER KNOW ABOUT: Any allergies you have. All medicines you are taking, including vitamins, herbs, eye drops, creams, and over-the-counter medicines. Previous problems you or members of your family have had with the use of anesthetics. Any blood disorders you have. Previous surgeries you have had.  Any medical conditions you have. RISKS AND COMPLICATIONS Generally, this is a safe procedure. However, problems may occur, including: Infection. Bleeding. Allergic reactions to medicines. Damage to other structures or organs. A stone remaining in the common bile duct. A bile leak from the cyst duct that is clipped when your gallbladder is removed. The need to convert to open surgery, which requires a larger incision in the abdomen. This may be necessary if your surgeon thinks that it is not safe to continue with a laparoscopic procedure. BEFORE THE PROCEDURE Ask your health care provider about: Changing or stopping your regular medicines. This is especially important if you are taking diabetes medicines or blood thinners. Taking medicines such as aspirin and ibuprofen. These medicines can thin your blood. Do not take these medicines before your procedure if your health care provider instructs you not to. Follow instructions from your health care provider about eating or drinking restrictions. Let  your health care provider know if you develop a cold or an infection before surgery. Plan to have someone take you home after the procedure. Ask your health care provider how your surgical site will be marked or identified. You may be given antibiotic medicine to help prevent  infection. PROCEDURE To reduce your risk of infection: Your health care team will wash or sanitize their hands. Your skin will be washed with soap. An IV tube may be inserted into one of your veins. You will be given a medicine to make you fall asleep (general anesthetic). A breathing tube will be placed in your mouth. The surgeon will make several small cuts (incisions) in your abdomen. A thin, lighted tube (laparoscope) that has a tiny camera on the end will be inserted through one of the small incisions. The camera on the laparoscope will send a picture to a TV screen (monitor) in the operating room. This will give the surgeon a good view inside your abdomen. A gas will be pumped into your abdomen. This will expand your abdomen to give the surgeon more room to perform the surgery. Other tools that are needed for the procedure will be inserted through the other incisions. The gallbladder will be removed through one of the incisions. After your gallbladder has been removed, the incisions will be closed with stitches (sutures), staples, or skin glue. Your incisions may be covered with a bandage (dressing). The procedure may vary among health care providers and hospitals. AFTER THE PROCEDURE Your blood pressure, heart rate, breathing rate, and blood oxygen level will be monitored often until the medicines you were given have worn off. You will be given medicines as needed to control your pain.   This information is not intended to replace advice given to you by your health care provider. Make sure you discuss any questions you have with your health care provider.   Document Released: 04/14/2005 Document Revised: 01/03/2015 Document Reviewed: 11/24/2012 Elsevier Interactive Patient Education 2016 [employer] Inc[employer]   Low-Fat Diet for Gallbladder Conditions A low-fat diet can be helpful if you have pancreatitis or a gallbladder condition. With these conditions, your pancreas and gallbladder have  trouble digesting fats. A healthy eating plan with less fat will help rest your pancreas and gallbladder and reduce your symptoms. WHAT DO I NEED TO KNOW ABOUT THIS DIET? Eat a low-fat diet. Reduce your fat intake to less than 20-30% of your total daily calories. This is less than 50-60 g of fat per day. Remember that you need some fat in your diet. Ask your dietician what your daily goal should be. Choose nonfat and low-fat healthy foods. Look for the words "nonfat," "low fat," or "fat free." As a guide, look on the label and choose foods with less than 3 g of fat per serving. Eat only one serving. Avoid alcohol. Do not smoke. If you need help quitting, talk with your health care provider. Eat small frequent meals instead of three large heavy meals. WHAT FOODS CAN I EAT? Grains Include healthy grains and starches such as potatoes, wheat bread, fiber-rich cereal, and brown rice. Choose whole grain options whenever possible. In adults, whole grains should account for 45-65% of your daily calories.  Fruits and Vegetables Eat plenty of fruits and vegetables. Fresh fruits and vegetables add fiber to your diet. Meats and Other Protein Sources Eat lean meat such as chicken and pork. Trim any fat off of meat before cooking it. Eggs, fish, and beans are other sources of protein.  In adults, these foods should account for 10-35% of your daily calories. Dairy Choose low-fat milk and dairy options. Dairy includes fat and protein, as well as calcium.  Fats and Oils Limit high-fat foods such as fried foods, sweets, baked goods, sugary drinks.  Other Creamy sauces and condiments, such as mayonnaise, can add extra fat. Think about whether or not you need to use them, or use smaller amounts or low fat options. WHAT FOODS ARE NOT RECOMMENDED? High fat foods, such as: [employer]. Ice cream. [place] toast. Sweet rolls. Pizza. Cheese bread. Foods covered with batter, butter, creamy sauces, or  cheese. Fried foods. Sugary drinks and desserts. Foods that cause gas or bloating   This information is not intended to replace advice given to you by your health care provider. Make sure you discuss any questions you have with your health care provider.   Document Released: 04/19/2013 Document Reviewed: 04/19/2013 Elsevier Interactive Patient Education [employer]

## 2023-07-08 ENCOUNTER — Telehealth: Payer: Self-pay | Admitting: General Surgery

## 2023-07-08 ENCOUNTER — Other Ambulatory Visit
Admission: RE | Admit: 2023-07-08 | Discharge: 2023-07-08 | Disposition: A | Discharge status: Home / Self Care | Source: Ambulatory Visit | Attending: General Surgery | Admitting: General Surgery

## 2023-07-08 ENCOUNTER — Ambulatory Visit
Admission: RE | Admit: 2023-07-08 | Discharge: 2023-07-08 | Disposition: A | Discharge status: Home / Self Care | Source: Ambulatory Visit | Attending: General Surgery | Admitting: General Surgery

## 2023-07-08 DIAGNOSIS — K8000 Calculus of gallbladder with acute cholecystitis without obstruction: Secondary | ICD-10-CM

## 2023-07-08 LAB — HEPATIC FUNCTION PANEL
ALT: 81 U/L — ABNORMAL HIGH (ref 0–44)
AST: 38 U/L (ref 15–41)
Albumin: 3.5 g/dL (ref 3.5–5.0)
Alkaline Phosphatase: 89 U/L (ref 38–126)
Bilirubin, Direct: <0.1 mg/dL (ref 0.0–0.2)
Total Bilirubin: 0.8 mg/dL (ref 0.0–1.2)
Total Protein: 6.9 g/dL (ref 6.5–8.1)

## 2023-07-08 LAB — CBC WITH DIFFERENTIAL/PLATELET
Abs Immature Granulocytes: 0.02 10*3/uL (ref 0.00–0.07)
Basophils Absolute: 0 10*3/uL (ref 0.0–0.1)
Basophils Relative: 1 %
Eosinophils Absolute: 0.1 10*3/uL (ref 0.0–0.5)
Eosinophils Relative: 2 %
HCT: 41.2 % (ref 36.0–46.0)
Hemoglobin: 14.4 g/dL (ref 12.0–15.0)
Immature Granulocytes: 0 %
Lymphocytes Relative: 39 %
Lymphs Abs: 2.2 10*3/uL (ref 0.7–4.0)
MCH: 28.7 pg (ref 26.0–34.0)
MCHC: 35 g/dL (ref 30.0–36.0)
MCV: 82.1 fL (ref 80.0–100.0)
Monocytes Absolute: 0.4 10*3/uL (ref 0.1–1.0)
Monocytes Relative: 8 %
Neutro Abs: 2.8 10*3/uL (ref 1.7–7.7)
Neutrophils Relative %: 50 %
Platelets: 333 10*3/uL (ref 150–400)
RBC: 5.02 MIL/uL (ref 3.87–5.11)
RDW: 13.1 % (ref 11.5–15.5)
WBC: 5.6 10*3/uL (ref 4.0–10.5)
nRBC: 0 % (ref 0.0–0.2)

## 2023-07-08 NOTE — Telephone Encounter (Signed)
 Left message for patient to call, please inform her of the following regarding scheduled surgery with Dr. Maurine Minister.   Pre-Admission date/time, and Surgery date at [facility].  Surgery Date: 07/10/23 Preadmission Testing Date: 07/09/23 (phone 1p-4p)  Also patient will need to call at [phone], between 1-3:00pm the day before surgery, to find out what time to arrive for surgery.

## 2023-07-09 ENCOUNTER — Encounter
Admission: RE | Admit: 2023-07-09 | Discharge: 2023-07-09 | Disposition: A | Discharge status: Home / Self Care | Source: Ambulatory Visit | Attending: General Surgery | Admitting: General Surgery

## 2023-07-09 DIAGNOSIS — E1169 Type 2 diabetes mellitus with other specified complication: Secondary | ICD-10-CM

## 2023-07-09 DIAGNOSIS — Z01818 Encounter for other preprocedural examination: Secondary | ICD-10-CM

## 2023-07-09 DIAGNOSIS — Z01810 Encounter for preprocedural cardiovascular examination: Secondary | ICD-10-CM

## 2023-07-09 HISTORY — DX: Personal history of COVID-19: Z86.16

## 2023-07-09 HISTORY — DX: Presence of insulin pump (external) (internal): Z96.41

## 2023-07-09 HISTORY — DX: Calculus of bile duct without cholangitis or cholecystitis without obstruction: K80.50

## 2023-07-09 HISTORY — DX: Type 2 diabetes mellitus with ketoacidosis without coma: E11.10

## 2023-07-09 HISTORY — DX: Type 2 diabetes mellitus without complications: E11.9

## 2023-07-09 HISTORY — DX: Depression, unspecified: F32.A

## 2023-07-09 HISTORY — DX: Obesity, unspecified: E66.9

## 2023-07-09 MED ORDER — SODIUM CHLORIDE 0.9 % IV SOLN
2.0000 g | INTRAVENOUS | Status: AC
  Administered 2023-07-10: 2 g via INTRAVENOUS

## 2023-07-09 MED ORDER — ORAL CARE MOUTH RINSE: 15.0000 mL | Freq: Once | OROMUCOSAL | Status: AC

## 2023-07-09 MED ORDER — CHLORHEXIDINE GLUCONATE CLOTH 2 % EX PADS: 6.0000 | MEDICATED_PAD | Freq: Once | CUTANEOUS | Status: DC

## 2023-07-09 MED ORDER — CHLORHEXIDINE GLUCONATE 0.12 % MT SOLN
15.0000 mL | Freq: Once | OROMUCOSAL | Status: AC
  Administered 2023-07-10: 15 mL via OROMUCOSAL

## 2023-07-09 MED ORDER — SODIUM CHLORIDE 0.9 % IV SOLN: INTRAVENOUS | Status: DC

## 2023-07-09 MED ORDER — CHLORHEXIDINE GLUCONATE CLOTH 2 % EX PADS
6.0000 | MEDICATED_PAD | Freq: Once | CUTANEOUS | Status: AC
  Administered 2023-07-10: 6 via TOPICAL

## 2023-07-09 NOTE — Progress Notes (Signed)
 Called diabetic coordinator Zerita Boers) and informed her that pt has insulin pump and pt's surgery is tomorrow. Placed order in Epic for Diabetes Coordinator Consult per her request

## 2023-07-09 NOTE — Telephone Encounter (Signed)
 Called patient back she is now informed of all dates regarding surgery and reminded to keep her phone close by as preadmit will call her today.

## 2023-07-09 NOTE — Patient Instructions (Signed)
 Your procedure is scheduled on:07-10-23 Friday Report to the Registration Desk on the 1st floor of the Medical Mall.Then proceed to the 2nd floor Surgery Desk To find out your arrival time, please call [phone] between 1PM - 3PM on:07-09-23 Thursday If your arrival time is 6:00 am, do not arrive before that time as the Medical Mall entrance doors do not open until 6:00 am.  REMEMBER: Instructions that are not followed completely may result in serious medical risk, up to and including death; or upon the discretion of your surgeon and anesthesiologist your surgery may need to be rescheduled.  Do not eat food OR drink any liquids after midnight the night before surgery.  No gum chewing or hard candies.  One week prior to surgery:Stop NOW (07-09-23) Stop Anti-inflammatories (NSAIDS) such as Advil, Aleve, Ibuprofen, Motrin, Naproxen, Naprosyn and Aspirin based products such as Excedrin, Goody's Powder, BC Powder. Stop ANY OVER THE COUNTER supplements until after surgery.  You may however, continue to take Tylenol if needed for pain up until the day of surgery.  Stop metFORMIN (GLUCOPHAGE) NOW 07-09-23  Stop MOUNJARO 7 days prior to surgery-DO NOT take again until AFTER surgery  Continue taking all of your other prescription medications up until the day of surgery.  ON THE DAY OF SURGERY ONLY TAKE THESE MEDICATIONS WITH SIPS OF WATER: -escitalopram (LEXAPRO)  -You may take hydrOXYzine (ATARAX) if needed for anxiety  Decrease your Basal Rate by 20% on your Insulin Pump at midnight tonight   No Alcohol for 24 hours before or after surgery.  No Smoking including e-cigarettes for 24 hours before surgery.  No chewable tobacco products for at least 6 hours before surgery.  No nicotine patches on the day of surgery.  Do not use any "recreational" drugs for at least a week (preferably 2 weeks) before your surgery.  Please be advised that the combination of cocaine and anesthesia may have  negative outcomes, up to and including death. If you test positive for cocaine, your surgery will be cancelled.  On the morning of surgery brush your teeth with toothpaste and water, you may rinse your mouth with mouthwash if you wish. Do not swallow any toothpaste or mouthwash.  Do not wear jewelry, make-up, hairpins, clips or nail polish.  For welded (permanent) jewelry: bracelets, anklets, waist bands, etc.  Please have this removed prior to surgery.  If it is not removed, there is a chance that hospital personnel will need to cut it off on the day of surgery.  Do not wear lotions, powders, or perfumes.   Do not shave body hair from the neck down 48 hours before surgery.  Contact lenses, hearing aids and dentures may not be worn into surgery.  Do not bring valuables to the hospital. [facility] is not responsible for any missing/lost belongings or valuables.   Notify your doctor if there is any change in your medical condition (cold, fever, infection).  Wear comfortable clothing (specific to your surgery type) to the hospital.  After surgery, you can help prevent lung complications by doing breathing exercises.  Take deep breaths and cough every 1-2 hours. Your doctor may order a device called an Incentive Spirometer to help you take deep breaths. When coughing or sneezing, hold a pillow firmly against your incision with both hands. This is called "splinting." Doing this helps protect your incision. It also decreases belly discomfort.  If you are being admitted to the hospital overnight, leave your suitcase in the car. After surgery  it may be brought to your room.  In case of increased patient census, it may be necessary for you, the patient, to continue your postoperative care in the Same Day Surgery department.  If you are being discharged the day of surgery, you will not be allowed to drive home. You will need a responsible individual to drive you home and stay with you for 24  hours after surgery.   If you are taking public transportation, you will need to have a responsible individual with you.  Please call the Pre-admissions Testing Dept. at [phone] if you have any questions about these instructions.  Surgery Visitation Policy:  Patients having surgery or a procedure may have two visitors.  Children under the age of 9 must have an adult with them who is not the patient.  Temporary Visitor Restrictions Due to increasing cases of flu, RSV and COVID-19: Children ages 30 and under will not be able to visit patients in [facility] hospitals under most circumstances.

## 2023-07-10 ENCOUNTER — Other Ambulatory Visit: Payer: Self-pay

## 2023-07-10 ENCOUNTER — Encounter
Admission: RE | Disposition: A | Discharge status: Home / Self Care | Payer: Self-pay | Source: Home / Self Care | Attending: General Surgery

## 2023-07-10 ENCOUNTER — Encounter: Payer: Self-pay | Admitting: General Surgery

## 2023-07-10 ENCOUNTER — Ambulatory Visit
Admission: RE | Admit: 2023-07-10 | Discharge: 2023-07-10 | Disposition: A | Discharge status: Home / Self Care | Attending: General Surgery | Admitting: General Surgery

## 2023-07-10 ENCOUNTER — Ambulatory Visit: Admitting: Anesthesiology

## 2023-07-10 DIAGNOSIS — Z833 Family history of diabetes mellitus: Secondary | ICD-10-CM | POA: Insufficient documentation

## 2023-07-10 DIAGNOSIS — Z7985 Long-term (current) use of injectable non-insulin antidiabetic drugs: Secondary | ICD-10-CM | POA: Insufficient documentation

## 2023-07-10 DIAGNOSIS — Z01810 Encounter for preprocedural cardiovascular examination: Secondary | ICD-10-CM | POA: Diagnosis not present

## 2023-07-10 DIAGNOSIS — Z6838 Body mass index (BMI) 38.0-38.9, adult: Secondary | ICD-10-CM | POA: Diagnosis not present

## 2023-07-10 DIAGNOSIS — I10 Essential (primary) hypertension: Secondary | ICD-10-CM | POA: Diagnosis not present

## 2023-07-10 DIAGNOSIS — Z87891 Personal history of nicotine dependence: Secondary | ICD-10-CM | POA: Diagnosis not present

## 2023-07-10 DIAGNOSIS — E66813 Obesity, class 3: Secondary | ICD-10-CM | POA: Insufficient documentation

## 2023-07-10 DIAGNOSIS — R519 Headache, unspecified: Secondary | ICD-10-CM | POA: Insufficient documentation

## 2023-07-10 DIAGNOSIS — E109 Type 1 diabetes mellitus without complications: Secondary | ICD-10-CM | POA: Diagnosis not present

## 2023-07-10 DIAGNOSIS — Z794 Long term (current) use of insulin: Secondary | ICD-10-CM | POA: Insufficient documentation

## 2023-07-10 DIAGNOSIS — K8020 Calculus of gallbladder without cholecystitis without obstruction: Secondary | ICD-10-CM

## 2023-07-10 DIAGNOSIS — F319 Bipolar disorder, unspecified: Secondary | ICD-10-CM | POA: Insufficient documentation

## 2023-07-10 DIAGNOSIS — Z01818 Encounter for other preprocedural examination: Secondary | ICD-10-CM

## 2023-07-10 DIAGNOSIS — Z79899 Other long term (current) drug therapy: Secondary | ICD-10-CM | POA: Insufficient documentation

## 2023-07-10 DIAGNOSIS — K8010 Calculus of gallbladder with chronic cholecystitis without obstruction: Secondary | ICD-10-CM | POA: Insufficient documentation

## 2023-07-10 DIAGNOSIS — K8050 Calculus of bile duct without cholangitis or cholecystitis without obstruction: Secondary | ICD-10-CM

## 2023-07-10 DIAGNOSIS — E1169 Type 2 diabetes mellitus with other specified complication: Secondary | ICD-10-CM

## 2023-07-10 DIAGNOSIS — K828 Other specified diseases of gallbladder: Secondary | ICD-10-CM | POA: Diagnosis not present

## 2023-07-10 DIAGNOSIS — K8000 Calculus of gallbladder with acute cholecystitis without obstruction: Secondary | ICD-10-CM | POA: Diagnosis present

## 2023-07-10 LAB — POCT PREGNANCY, URINE: Preg Test, Ur: NEGATIVE

## 2023-07-10 LAB — GLUCOSE, CAPILLARY
Glucose-Capillary: 201 mg/dL — ABNORMAL HIGH (ref 70–99)
Glucose-Capillary: 209 mg/dL — ABNORMAL HIGH (ref 70–99)

## 2023-07-10 SURGERY — CHOLECYSTECTOMY, ROBOT-ASSISTED, LAPAROSCOPIC
Anesthesia: General | Site: Abdomen

## 2023-07-10 MED ORDER — BUPIVACAINE-EPINEPHRINE (PF) 0.5% -1:200000 IJ SOLN
INTRAMUSCULAR | Status: AC
  Filled 2023-07-10: qty 30

## 2023-07-10 MED ORDER — MIDAZOLAM HCL 2 MG/2ML IJ SOLN
INTRAMUSCULAR | Status: AC
  Filled 2023-07-10: qty 2

## 2023-07-10 MED ORDER — DROPERIDOL 2.5 MG/ML IJ SOLN
INTRAMUSCULAR | Status: AC
  Filled 2023-07-10: qty 2

## 2023-07-10 MED ORDER — PHENYLEPHRINE HCL-NACL 20-0.9 MG/250ML-% IV SOLN
INTRAVENOUS | Status: DC | PRN
Start: 2023-07-10 — End: 2023-07-10
  Administered 2023-07-10: 25 ug/min via INTRAVENOUS

## 2023-07-10 MED ORDER — MIDAZOLAM HCL 2 MG/2ML IJ SOLN
INTRAMUSCULAR | Status: DC | PRN
  Administered 2023-07-10: 2 mg via INTRAVENOUS

## 2023-07-10 MED ORDER — LIDOCAINE HCL (CARDIAC) PF 100 MG/5ML IV SOSY
PREFILLED_SYRINGE | INTRAVENOUS | Status: DC | PRN
  Administered 2023-07-10: 100 mg via INTRAVENOUS

## 2023-07-10 MED ORDER — PROPOFOL 1000 MG/100ML IV EMUL
INTRAVENOUS | Status: AC
  Filled 2023-07-10: qty 100

## 2023-07-10 MED ORDER — FENTANYL CITRATE (PF) 100 MCG/2ML IJ SOLN
INTRAMUSCULAR | Status: DC | PRN
  Administered 2023-07-10 (×2): 50 ug via INTRAVENOUS

## 2023-07-10 MED ORDER — DEXMEDETOMIDINE HCL IN NACL 200 MCG/50ML IV SOLN
INTRAVENOUS | Status: DC | PRN
  Administered 2023-07-10: 8 ug via INTRAVENOUS

## 2023-07-10 MED ORDER — GLYCOPYRROLATE 0.2 MG/ML IJ SOLN
INTRAMUSCULAR | Status: DC | PRN
Start: 2023-07-10 — End: 2023-07-10
  Administered 2023-07-10: .2 mg via INTRAVENOUS

## 2023-07-10 MED ORDER — OXYCODONE HCL 5 MG PO TABS: 5.0000 mg | ORAL_TABLET | Freq: Four times a day (QID) | ORAL | 0 refills | Status: DC | PRN

## 2023-07-10 MED ORDER — INDOCYANINE GREEN 25 MG IV SOLR
INTRAVENOUS | Status: AC
  Filled 2023-07-10: qty 10

## 2023-07-10 MED ORDER — SUCCINYLCHOLINE CHLORIDE 200 MG/10ML IV SOSY
PREFILLED_SYRINGE | INTRAVENOUS | Status: DC | PRN
Start: 2023-07-10 — End: 2023-07-10
  Administered 2023-07-10: 100 mg via INTRAVENOUS

## 2023-07-10 MED ORDER — ROCURONIUM BROMIDE 100 MG/10ML IV SOLN
INTRAVENOUS | Status: DC | PRN
  Administered 2023-07-10: 40 mg via INTRAVENOUS
  Administered 2023-07-10: 10 mg via INTRAVENOUS

## 2023-07-10 MED ORDER — KETOROLAC TROMETHAMINE 30 MG/ML IJ SOLN
INTRAMUSCULAR | Status: DC | PRN
  Administered 2023-07-10: 30 mg via INTRAVENOUS

## 2023-07-10 MED ORDER — DROPERIDOL 2.5 MG/ML IJ SOLN
0.6250 mg | Freq: Once | INTRAMUSCULAR | Status: AC
  Administered 2023-07-10: 0.625 mg via INTRAVENOUS

## 2023-07-10 MED ORDER — PROPOFOL 10 MG/ML IV BOLUS
INTRAVENOUS | Status: DC | PRN
  Administered 2023-07-10: 200 mg via INTRAVENOUS

## 2023-07-10 MED ORDER — ACETAMINOPHEN 10 MG/ML IV SOLN
INTRAVENOUS | Status: DC | PRN
  Administered 2023-07-10: 1000 mg via INTRAVENOUS

## 2023-07-10 MED ORDER — CHLORHEXIDINE GLUCONATE 0.12 % MT SOLN
OROMUCOSAL | Status: AC
Start: 2023-07-10 — End: ?
  Filled 2023-07-10: qty 15

## 2023-07-10 MED ORDER — ONDANSETRON HCL 4 MG/2ML IJ SOLN
INTRAMUSCULAR | Status: DC | PRN
  Administered 2023-07-10 (×2): 4 mg via INTRAVENOUS

## 2023-07-10 MED ORDER — EPHEDRINE SULFATE-NACL 50-0.9 MG/10ML-% IV SOSY
PREFILLED_SYRINGE | INTRAVENOUS | Status: DC | PRN
  Administered 2023-07-10: 5 mg via INTRAVENOUS

## 2023-07-10 MED ORDER — INDOCYANINE GREEN 25 MG IV SOLR
1.2500 mg | Freq: Once | INTRAVENOUS | Status: AC
  Administered 2023-07-10: 1.25 mg via INTRAVENOUS

## 2023-07-10 MED ORDER — PHENYLEPHRINE 80 MCG/ML (10ML) SYRINGE FOR IV PUSH (FOR BLOOD PRESSURE SUPPORT)
PREFILLED_SYRINGE | INTRAVENOUS | Status: DC | PRN
  Administered 2023-07-10 (×3): 160 ug via INTRAVENOUS

## 2023-07-10 MED ORDER — BUPIVACAINE LIPOSOME 1.3 % IJ SUSP
INTRAMUSCULAR | Status: AC
  Filled 2023-07-10: qty 10

## 2023-07-10 MED ORDER — FENTANYL CITRATE (PF) 100 MCG/2ML IJ SOLN
INTRAMUSCULAR | Status: AC
  Filled 2023-07-10: qty 2

## 2023-07-10 MED ORDER — SODIUM CHLORIDE 0.9 % IV SOLN
INTRAVENOUS | Status: AC
  Filled 2023-07-10: qty 2

## 2023-07-10 MED ORDER — SUGAMMADEX SODIUM 200 MG/2ML IV SOLN
INTRAVENOUS | Status: DC | PRN
  Administered 2023-07-10: 200 mg via INTRAVENOUS

## 2023-07-10 MED ORDER — BUPIVACAINE LIPOSOME 1.3 % IJ SUSP
INTRAMUSCULAR | Status: DC | PRN
  Administered 2023-07-10: 10 mL

## 2023-07-10 MED ORDER — BUPIVACAINE-EPINEPHRINE (PF) 0.5% -1:200000 IJ SOLN
INTRAMUSCULAR | Status: DC | PRN
  Administered 2023-07-10: 10 mL

## 2023-07-10 SURGICAL SUPPLY — 35 items
CANNULA REDUCER 12-8 DVNC XI (CANNULA) ×1 IMPLANT
CAUTERY HOOK MNPLR 1.6 DVNC XI (INSTRUMENTS) ×1 IMPLANT
CLIP LIGATING HEMO O LOK GREEN (MISCELLANEOUS) ×1 IMPLANT
DERMABOND ADVANCED .7 DNX12 (GAUZE/BANDAGES/DRESSINGS) ×1 IMPLANT
DRAPE ARM DVNC X/XI (DISPOSABLE) ×4 IMPLANT
DRAPE COLUMN DVNC XI (DISPOSABLE) ×1 IMPLANT
ELECT REM PT RETURN 9FT ADLT (ELECTROSURGICAL) ×1 IMPLANT
ELECTRODE REM PT RTRN 9FT ADLT (ELECTROSURGICAL) ×1 IMPLANT
FORCEPS BPLR R/ABLATION 8 DVNC (INSTRUMENTS) ×1 IMPLANT
FORCEPS PROGRASP DVNC XI (FORCEP) ×1 IMPLANT
GLOVE BIOGEL PI IND STRL 7.5 (GLOVE) ×2 IMPLANT
GLOVE SURG SYN 7.0 (GLOVE) ×2 IMPLANT
GLOVE SURG SYN 7.0 PF PI (GLOVE) ×2 IMPLANT
GOWN STRL REUS W/ TWL LRG LVL3 (GOWN DISPOSABLE) ×3 IMPLANT
GRASPER SUT TROCAR 14GX15 (MISCELLANEOUS) ×1 IMPLANT
KIT PINK PAD W/HEAD ARE REST (MISCELLANEOUS) ×1 IMPLANT
KIT PINK PAD W/HEAD ARM REST (MISCELLANEOUS) ×1 IMPLANT
LABEL OR SOLS (LABEL) ×1 IMPLANT
NDL HYPO 22X1.5 SAFETY MO (MISCELLANEOUS) ×1 IMPLANT
NDL INSUFFLATION 14GA 120MM (NEEDLE) ×1 IMPLANT
NEEDLE HYPO 22X1.5 SAFETY MO (MISCELLANEOUS) ×1 IMPLANT
NEEDLE INSUFFLATION 14GA 120MM (NEEDLE) ×1 IMPLANT
NS IRRIG 500ML POUR BTL (IV SOLUTION) ×1 IMPLANT
OBTURATOR OPTICAL STND 8 DVNC (TROCAR) ×1 IMPLANT
OBTURATOR OPTICALSTD 8 DVNC (TROCAR) ×1 IMPLANT
PACK LAP CHOLECYSTECTOMY (MISCELLANEOUS) ×1 IMPLANT
SEAL UNIV 5-12 XI (MISCELLANEOUS) ×4 IMPLANT
SET TUBE SMOKE EVAC HIGH FLOW (TUBING) ×1 IMPLANT
SOL ELECTROSURG ANTI STICK (MISCELLANEOUS) ×1 IMPLANT
SOLUTION ELECTROSURG ANTI STCK (MISCELLANEOUS) ×1 IMPLANT
SUT MNCRL 4-0 27 PS-2 XMFL (SUTURE) ×1 IMPLANT
SUT VICRYL 0 UR6 27IN ABS (SUTURE) ×1 IMPLANT
SUTURE MNCRL 4-0 27XMF (SUTURE) ×1 IMPLANT
SYS BAG RETRIEVAL 10MM (BASKET) ×1 IMPLANT
SYSTEM BAG RETRIEVAL 10MM (BASKET) ×1 IMPLANT

## 2023-07-10 NOTE — Anesthesia Postprocedure Evaluation (Signed)
 Anesthesia Post Note  Patient: Madison Harrell  Procedure(s) Performed: CHOLECYSTECTOMY, ROBOT-ASSISTED, LAPAROSCOPIC (Abdomen)  Patient location during evaluation: PACU Anesthesia Type: General Level of consciousness: awake Pain management: pain level controlled Vital Signs Assessment: post-procedure vital signs reviewed and stable Respiratory status: spontaneous breathing Cardiovascular status: stable Anesthetic complications: no   No notable events documented.   Last Vitals:  Vitals:   07/10/23 0910 07/10/23 0915  BP:  (!) 107/53  Pulse: 83 82  Resp: 16 19  Temp:    SpO2: 96% 97%    Last Pain:  Vitals:   07/10/23 0915  TempSrc:   PainSc: Asleep                 VAN STAVEREN,Marcille Barman

## 2023-07-10 NOTE — Anesthesia Preprocedure Evaluation (Signed)
 Anesthesia Evaluation  Patient identified by MRN, date of birth, ID band Patient awake    Reviewed: Allergy & Precautions, NPO status , Patient's Chart, lab work & pertinent test results  Airway Mallampati: III  TM Distance: <3 FB     Dental  (+) Teeth Intact   Pulmonary neg pulmonary ROS, former smoker   breath sounds clear to auscultation       Cardiovascular Exercise Tolerance: Good hypertension, Pt. on medications  Rhythm:Regular Rate:Normal     Neuro/Psych  Headaches    Bipolar Disorder      GI/Hepatic negative GI ROS, Neg liver ROS,,,  Endo/Other  diabetes, Well Controlled, Type 1, Insulin Dependent  Class 3 obesity  Renal/GU negative Renal ROS  negative genitourinary   Musculoskeletal negative musculoskeletal ROS (+)    Abdominal  (+) + obese  Peds negative pediatric ROS (+)  Hematology negative hematology ROS (+)   Anesthesia Other Findings   Reproductive/Obstetrics                             Anesthesia Physical Anesthesia Plan  ASA: 3  Anesthesia Plan: General   Post-op Pain Management:    Induction: Intravenous  PONV Risk Score and Plan: 1 and Ondansetron and Dexamethasone  Airway Management Planned: Oral ETT  Additional Equipment:   Intra-op Plan:   Post-operative Plan: Extubation in OR  Informed Consent: I have reviewed the patients History and Physical, chart, labs and discussed the procedure including the risks, benefits and alternatives for the proposed anesthesia with the patient or authorized representative who has indicated his/her understanding and acceptance.       Plan Discussed with: CRNA and Surgeon  Anesthesia Plan Comments:        Anesthesia Quick Evaluation

## 2023-07-10 NOTE — Op Note (Signed)
 Laparoscopic Cholecystectomy  Pre-operative Diagnosis: Symptomatic Cholelithiasis  Post-operative Diagnosis: Same  Procedure: Robotic assisted cholecystectomy with ICG  Surgeon: Baker Pierini, MD  Anesthesia: Gen. with endotracheal tube   Findings: Adhesions to the gallbladder consistent with likely history of biliary colic   Estimated Blood Loss: 15         Drains: None         Specimens: Gallbladder           Complications: none   Procedure Details  The patient was seen again in the Holding Room. The benefits, complications, treatment options, and expected outcomes were discussed with the patient. The risks of bleeding, infection, recurrence of symptoms, failure to resolve symptoms, bile duct damage, bile duct leak, retained common bile duct stone, bowel injury, any of which could require further surgery and/or ERCP, stent, or papillotomy were reviewed with the patient. The likelihood of improving the patient's symptoms with return to their baseline status is good.  The patient and/or family concurred with the proposed plan, giving informed consent.  The patient was taken to Operating Room, identified as Madison Harrell and the procedure verified as Laparoscopic Cholecystectomy.  A Time Out was held and the above information confirmed.  Prior to the induction of general anesthesia, antibiotic prophylaxis was administered. VTE prophylaxis was in place. General endotracheal anesthesia was then administered and tolerated well. After the induction, the abdomen was prepped with Chloraprep and draped in the sterile fashion. The patient was positioned in the supine position.  Using standard drop technique a veress needle was inserted into the abdomen in the Left upper quadrant. Given history of adheisons I elected to place a 5mm port at this site. Pneumoperitoneum was then created with CO2 and tolerated well without any adverse changes in the patient's vital signs.  A 5mm LUQ portt was  inserted using an optiveiew trocar. There were no injuries noted at site of veress insertion. There wre some adhesions down in the pelvis extending up towards the umbilicus. An 8mm supra-umbilical port was then placed under direct visualizatioh. A 12mm left hemiabdominal port and Two 8-mm ports were placed in the right upper quadrant all under direct vision.   The patient was positioned  in reverse Trendelenburg, tilted slightly to the patient's left.  The gallbladder was identified, the fundus grasped and retracted cephalad. Adhesions were lysed bluntly. The infundibulum was grasped and retracted laterally, exposing the peritoneum overlying the triangle of Calot. This was then divided and exposed in a blunt fashion. An extended critical view of the cystic duct and cystic artery was obtained.  The cystic duct was clearly identified and bluntly dissected.   Artery and duct were double clipped and divided.  The gallbladder was taken from the gallbladder fossa in a retrograde fashion with the electrocautery. The gallbladder was removed and placed in an Endocatch bag. The liver bed was inspected. Hemostasis was achieved with the electrocautery. The gallbladder and Endocatch sac were then removed through a port site.    Inspection of the right upper quadrant was performed. No bleeding, bile duct injury or leak, or bowel injury was noted. The epigastric port fascia was closed with a 0 vicryl using a PMI. Pneumoperitoneum was released. Marland Kitchen 4-0 subcuticular Monocryl was used to close the skin. Dermabond was  applied.  The patient was then extubated and brought to the recovery room in stable condition. Sponge, lap, and needle counts were correct at closure and at the conclusion of the case.

## 2023-07-10 NOTE — Transfer of Care (Signed)
 Immediate Anesthesia Transfer of Care Note  Patient: Madison Harrell  Procedure(s) Performed: CHOLECYSTECTOMY, ROBOT-ASSISTED, LAPAROSCOPIC (Abdomen)  Patient Location: PACU  Anesthesia Type:General  Level of Consciousness: awake, drowsy, and patient cooperative  Airway & Oxygen Therapy: Patient Spontanous Breathing and Patient connected to face mask oxygen  Post-op Assessment: Report given to RN and Post -op Vital signs reviewed and stable  Post vital signs: Reviewed and stable  Last Vitals:  Vitals Value Taken Time  BP    Temp    Pulse    Resp 21 07/10/23 0908  SpO2    Vitals shown include unfiled device data.  Last Pain:  Vitals:   07/10/23 0659  TempSrc: Temporal  PainSc: 0-No pain         Complications: No notable events documented.

## 2023-07-10 NOTE — Anesthesia Procedure Notes (Signed)
 Procedure Name: Intubation Date/Time: 07/10/2023 7:41 AM  Performed by: Mohammed Kindle, CRNAPre-anesthesia Checklist: Patient identified, Emergency Drugs available, Suction available and Patient being monitored Patient Re-evaluated:Patient Re-evaluated prior to induction Oxygen Delivery Method: Circle system utilized Preoxygenation: Pre-oxygenation with 100% oxygen Induction Type: IV induction Ventilation: Mask ventilation without difficulty Laryngoscope Size: McGrath and 3 Grade View: Grade I Tube type: Oral Number of attempts: 1 Airway Equipment and Method: Stylet Placement Confirmation: ETT inserted through vocal cords under direct vision, positive ETCO2 and breath sounds checked- equal and bilateral Secured at: 21 cm Tube secured with: Tape Dental Injury: Teeth and Oropharynx as per pre-operative assessment

## 2023-07-10 NOTE — H&P (Signed)
 No changes to below H and P, patient has had [place] thsi week that did not show any evdience of cholecystitis. No signs of biliary obstruction on labs. Proceed as planned with robotic assisted cholecystectomy with ICG.    CC: Biliary Colic History of Present Illness Madison Harrell is a 33 y.o. female with history of diabetes type 1 who presents in consultation with biliary colic.  I did a laparoscopic appendectomy on the patient even prior to this she reported episodes of right upper quadrant pain after eating.  However at the time of her appendectomy this was not causing her much pain.  Over the last several days she has had worsening right upper quadrant pain and back pain.  She says that this is associated with nausea and some episodes of vomiting.  This happens every time she eats no matter what type of food it is.  She has not had any fevers or chills.  The pain was so bad that she went to the emergency department for evaluation.  There she had a workup that was significant for symptomatic cholelithiasis.  She reports that since then she is continue to have right upper quadrant pain but it is eased with ibuprofen.  She denies any jaundice.  She denies any diarrhea or constipation.  She is worried she cannot eat because she has diabetes and her sugars have been dropping low.   Past Medical History     Past Medical History:  Diagnosis Date   Diabetes mellitus without complication (HCC)     Hypertension     pre E           \        Past Surgical History:  Procedure Laterality Date   CESAREAN SECTION   2013   LAPAROSCOPIC APPENDECTOMY N/A 01/23/2023    Procedure: APPENDECTOMY LAPAROSCOPIC;  Surgeon: Kandis Cocking, MD;  Location: [facility] ORS;  Service: General;  Laterality: N/A;   WISDOM TOOTH EXTRACTION        four;          Allergies  No Known Allergies           Current Outpatient Medications  Medication Sig Dispense Refill   Continuous Glucose Sensor (DEXCOM G6 SENSOR) MISC Use  to monitor blood sugar.  Replace every 10 days       ergocalciferol (VITAMIN D2) 1.25 MG (50000 UT) capsule Take 50,000 Units by mouth once a week. Takes on Wednesdays.       hyoscyamine (LEVSIN/SL) 0.125 MG SL tablet Place 1 tablet (0.125 mg total) under the tongue every 4 (four) hours as needed. 30 tablet 0   ibuprofen (ADVIL) 800 MG tablet Take 1 tablet (800 mg total) by mouth every 8 (eight) hours as needed. 30 tablet 0   insulin aspart (NOVOLOG FLEXPEN) 100 UNIT/ML FlexPen Inject 15 units subcutaneously daily, plus a sliding scale with meals. Max daily dose of 30 units.       Insulin Disposable Pump (OMNIPOD 5 DEXG7G6 PODS GEN 5) MISC CHANGE POD EVERY 3RD DAY       Insulin Pen Needle 32G X 4 MM MISC Use as Levemir pen as directed 100 each 0   Levonorgestrel-Ethinyl Estradiol (AMETHIA) 0.15-0.03 &0.01 MG tablet Take 1 tablet by mouth at bedtime. 84 tablet 3   MOUNJARO 7.5 MG/0.5ML Pen Inject 7.5 mg into the skin once a week. Takes on Tuesday       ondansetron (ZOFRAN) 4 MG tablet Take 1 tablet (4 mg total) by  mouth every 8 (eight) hours as needed for nausea. 15 tablet 0   QUEtiapine (SEROQUEL) 25 MG tablet Take 25 mg by mouth 2 (two) times daily as needed (anxiety, difficulty sleeping).       escitalopram (LEXAPRO) 10 MG tablet Take 25 mg by mouth daily.          No current facility-administered medications for this visit.        Family History      Family History  Problem Relation Age of Onset   Diabetes Mother     Diabetes Father     Asthma Sister     Diabetes Brother     Diabetes Maternal Grandmother     Diabetes Paternal Grandmother     Diabetes Paternal Grandfather              Social History Social History  Social History         Tobacco Use   Smoking status: Former      Types: Cigarettes      Passive exposure: Past   Smokeless tobacco: Never  Vaping Use   Vaping status: Never Used  Substance Use Topics   Alcohol use: No   Drug use: No             ROS Full ROS of systems performed and is otherwise negative there than what is stated in the HPI   Physical Exam Blood pressure 117/75, pulse 92, temperature 98.2 F (36.8 C), height 4\' 11"  (1.499 m), weight 190 lb (86.2 kg), last menstrual period 06/29/2023, SpO2 98%, not currently breastfeeding.   Alert and oriented x 3, clear to auscultation bilaterally, regular rate and rhythm, PERRLA, abdomen is soft, obese, laparoscopic port sites are well-healed with some hypertrophic scarring in the left lower quadrant.  She has pain to deep palpation in the epigastric and right upper quadrant region.  There is no rebound tenderness or Murphy sign positivity.   Data Reviewed Reviewed her labs from the ED and they are notable for a normal creatinine.  Her glucose at that time was 217.  She had a normal white count and was polycythemic.  Her CT scan shows a stone within the gallbladder lumen without any pericholecystic fluid or gallbladder wall thickening.  She does not have LFTs   I have personally reviewed the patient's imaging and medical records.     Assessment/Plan Assessment Patient with likely symptomatic cholelithiasis.  She is having a hard time eating secondary to the pain.  On exam she does have some tenderness in the epigastrium and the right upper quadrant.  I discussed with her that I would like to get an ultrasound to ensure that there is no evidence of infection.  We also repeat her CBC and get LFTs to make sure there is no hyperbilirubinemia.  If these are all negative then we will proceed with robotic assisted cholecystectomy on Friday.  I discussed the risk and benefits alternatives of the procedure including risk of infection, bleeding, retained stone, bile leak, injury to the common bile duct and need for open procedure.  She understands these risks and wishes to proceed.  We will use ICG           Kandis Cocking 07/07/2023, 4:11 PM

## 2023-07-11 ENCOUNTER — Other Ambulatory Visit: Payer: Self-pay | Admitting: Surgery

## 2023-07-11 MED ORDER — OXYCODONE HCL 5 MG PO TABS: 5.0000 mg | ORAL_TABLET | Freq: Four times a day (QID) | ORAL | 0 refills | Status: AC | PRN

## 2023-07-11 NOTE — Progress Notes (Signed)
 ReRx due to out of stock at [facility]

## 2023-07-13 LAB — SURGICAL PATHOLOGY

## 2023-07-17 ENCOUNTER — Emergency Department
Admission: EM | Admit: 2023-07-17 | Discharge: 2023-07-18 | Disposition: A | Discharge status: Home / Self Care | Attending: Emergency Medicine | Admitting: Emergency Medicine

## 2023-07-17 ENCOUNTER — Other Ambulatory Visit: Payer: Self-pay

## 2023-07-17 DIAGNOSIS — Z7984 Long term (current) use of oral hypoglycemic drugs: Secondary | ICD-10-CM | POA: Diagnosis not present

## 2023-07-17 DIAGNOSIS — I10 Essential (primary) hypertension: Secondary | ICD-10-CM | POA: Insufficient documentation

## 2023-07-17 DIAGNOSIS — Z8616 Personal history of COVID-19: Secondary | ICD-10-CM | POA: Diagnosis not present

## 2023-07-17 DIAGNOSIS — E119 Type 2 diabetes mellitus without complications: Secondary | ICD-10-CM | POA: Insufficient documentation

## 2023-07-17 DIAGNOSIS — T8189XA Other complications of procedures, not elsewhere classified, initial encounter: Secondary | ICD-10-CM | POA: Insufficient documentation

## 2023-07-17 DIAGNOSIS — Y838 Other surgical procedures as the cause of abnormal reaction of the patient, or of later complication, without mention of misadventure at the time of the procedure: Secondary | ICD-10-CM | POA: Insufficient documentation

## 2023-07-17 DIAGNOSIS — G8918 Other acute postprocedural pain: Secondary | ICD-10-CM | POA: Diagnosis present

## 2023-07-17 DIAGNOSIS — Z9641 Presence of insulin pump (external) (internal): Secondary | ICD-10-CM | POA: Insufficient documentation

## 2023-07-17 DIAGNOSIS — T148XXA Other injury of unspecified body region, initial encounter: Secondary | ICD-10-CM

## 2023-07-17 DIAGNOSIS — Z794 Long term (current) use of insulin: Secondary | ICD-10-CM | POA: Diagnosis not present

## 2023-07-17 DIAGNOSIS — Z79899 Other long term (current) drug therapy: Secondary | ICD-10-CM | POA: Insufficient documentation

## 2023-07-17 LAB — COMPREHENSIVE METABOLIC PANEL
ALT: 134 U/L — ABNORMAL HIGH (ref 0–44)
AST: 77 U/L — ABNORMAL HIGH (ref 15–41)
Albumin: 4 g/dL (ref 3.5–5.0)
Alkaline Phosphatase: 97 U/L (ref 38–126)
Anion gap: 10 (ref 5–15)
BUN: 22 mg/dL — ABNORMAL HIGH (ref 6–20)
CO2: 22 mmol/L (ref 22–32)
Calcium: 9 mg/dL (ref 8.9–10.3)
Chloride: 101 mmol/L (ref 98–111)
Creatinine, Ser: 0.5 mg/dL (ref 0.44–1.00)
GFR, Estimated: >60 mL/min (ref >60)
Glucose, Bld: 251 mg/dL — ABNORMAL HIGH (ref 70–99)
Potassium: 3.5 mmol/L (ref 3.5–5.1)
Sodium: 133 mmol/L — ABNORMAL LOW (ref 135–145)
Total Bilirubin: 1.1 mg/dL (ref 0.0–1.2)
Total Protein: 7.7 g/dL (ref 6.5–8.1)

## 2023-07-17 LAB — CBC WITH DIFFERENTIAL/PLATELET
Abs Immature Granulocytes: 0.01 10*3/uL (ref 0.00–0.07)
Basophils Absolute: 0 10*3/uL (ref 0.0–0.1)
Basophils Relative: 0 %
Eosinophils Absolute: 0.2 10*3/uL (ref 0.0–0.5)
Eosinophils Relative: 2 %
HCT: 41.8 % (ref 36.0–46.0)
Hemoglobin: 14.6 g/dL (ref 12.0–15.0)
Immature Granulocytes: 0 %
Lymphocytes Relative: 28 %
Lymphs Abs: 2 10*3/uL (ref 0.7–4.0)
MCH: 29.2 pg (ref 26.0–34.0)
MCHC: 34.9 g/dL (ref 30.0–36.0)
MCV: 83.6 fL (ref 80.0–100.0)
Monocytes Absolute: 0.4 10*3/uL (ref 0.1–1.0)
Monocytes Relative: 6 %
Neutro Abs: 4.6 10*3/uL (ref 1.7–7.7)
Neutrophils Relative %: 64 %
Platelets: 367 10*3/uL (ref 150–400)
RBC: 5 MIL/uL (ref 3.87–5.11)
RDW: 13.4 % (ref 11.5–15.5)
WBC: 7.2 10*3/uL (ref 4.0–10.5)
nRBC: 0 % (ref 0.0–0.2)

## 2023-07-17 LAB — LIPASE, BLOOD: Lipase: 27 U/L (ref 11–51)

## 2023-07-17 NOTE — ED Triage Notes (Signed)
 Pt has gall bladder removed here a week ago and today c/o bleeding from lower right laparoscopic wound. No bleeding noted at this time, no discharge, redness, odor, noted. Pt reports tenderness and continued pain and nausea from surgery.

## 2023-07-17 NOTE — ED Provider Notes (Signed)
 [facility] Provider Note    Event Date/Time   First MD Initiated Contact with Patient 07/17/23 2336     (approximate)   History   Post-op Problem   HPI  Madison Harrell is a 33 y.o. female with history of hypertension, diabetes, obesity who presents to the emergency department with concerns for bleeding from one of her surgical sites.  Patient underwent cholecystectomy with Dr. Maurine Minister on 07/10/2023.  States she is still having pain, bloating and occasional vomiting.  She states today she went out of the house and did more walking than she has done all week and then noticed afterwards that one of her incision sites appear to have opened and was bleeding.  States that she put gauze over it and bleeding has now stopped.  She denies any fevers, rigors but states she has felt cold.  When asked if she would have come to the emergency department if her wound did not start bleeding she stated no.  She states her husband was concerned and that is why they are here today.   History provided by patient, husband.    Past Medical History:  Diagnosis Date   Biliary colic    Depression    DKA (diabetic ketoacidosis) (HCC)    DM (diabetes mellitus), type 2 (HCC)    History of COVID-19    Hypertension    Insulin pump in place    Obesity (BMI 30-39.9)    Pregnancy induced hypertension     Past Surgical History:  Procedure Laterality Date   CESAREAN SECTION  2013   CHOLECYSTECTOMY     LAPAROSCOPIC APPENDECTOMY N/A 01/23/2023   Procedure: APPENDECTOMY LAPAROSCOPIC;  Surgeon: Kandis Cocking, MD;  Location: [facility] ORS;  Service: General;  Laterality: N/A;   WISDOM TOOTH EXTRACTION     four;    MEDICATIONS:  Prior to Admission medications   Medication Sig Start Date End Date Taking? Authorizing Provider  Continuous Glucose Sensor (DEXCOM G6 SENSOR) MISC Use to monitor blood sugar.  Replace every 10 days 07/16/22   [provider]  ergocalciferol (VITAMIN  D2) 1.25 MG (50000 UT) capsule Take 50,000 Units by mouth once a week. Takes on Wednesdays.    [provider]  escitalopram (LEXAPRO) 10 MG tablet Take 30 mg by mouth every morning. 01/13/23   [provider]  hydrOXYzine (ATARAX) 25 MG tablet Take 25 mg by mouth every 8 (eight) hours as needed for anxiety. 04/14/23   [provider]  hyoscyamine (LEVSIN/SL) 0.125 MG SL tablet Place 1 tablet (0.125 mg total) under the tongue every 4 (four) hours as needed. 07/01/23   Triplett, Rulon Eisenmenger B, FNP  ibuprofen (ADVIL) 800 MG tablet Take 1 tablet (800 mg total) by mouth every 8 (eight) hours as needed. 01/23/23   Kandis Cocking, MD  insulin aspart (NOVOLOG FLEXPEN) 100 UNIT/ML FlexPen Inject 15 units subcutaneously daily, plus a sliding scale with meals. Max daily dose of 30 units. VIA INSULIN PUMP    [provider]  Insulin Disposable Pump (OMNIPOD 5 DEXG7G6 PODS GEN 5) MISC CHANGE POD EVERY 3RD DAY 06/07/23   [provider]  Insulin Pen Needle 32G X 4 MM MISC Use as Levemir pen as directed 01/23/23   Lurene Shadow, MD  Levonorgestrel-Ethinyl Estradiol (AMETHIA) 0.15-0.03 &0.01 MG tablet Take 1 tablet by mouth at bedtime. 10/01/22   Free, Lindalou Hose, CNM  metFORMIN (GLUCOPHAGE) 500 MG tablet Take 1,000 mg by mouth 2 (two) times daily with  a meal. 05/09/23   [provider]  MOUNJARO 7.5 MG/0.5ML Pen Inject 7.5 mg into the skin once a week. Takes on Tuesday    [provider]  ondansetron (ZOFRAN) 4 MG tablet Take 1 tablet (4 mg total) by mouth every 8 (eight) hours as needed for nausea. 02/19/23 02/19/24  Janith Lima, MD  oxyCODONE (OXY IR/ROXICODONE) 5 MG immediate release tablet Take 1 tablet (5 mg total) by mouth every 6 (six) hours as needed for severe pain (pain score 7-10). 07/11/23   Campbell Lerner, MD  QUEtiapine (SEROQUEL) 25 MG tablet Take 25 mg by mouth 2 (two) times daily as needed (anxiety, difficulty sleeping). 11/18/22   [provider]    Physical Exam   Triage Vital Signs: ED Triage Vitals [07/17/23 2135]  Encounter Vitals Group     BP (!) 145/95     Systolic BP Percentile      Diastolic BP Percentile      Pulse Rate 99     Resp 16     Temp 98.6 F (37 C)     Temp Source Oral     SpO2 97 %     Weight      Height      Head Circumference      Peak Flow      Pain Score 6     Pain Loc      Pain Education      Exclude from Growth Chart     Most recent vital signs: Vitals:   07/17/23 2135 07/18/23 0015  BP: (!) 145/95 138/89  Pulse: 99 91  Resp: 16 18  Temp: 98.6 F (37 C) 98.3 F (36.8 C)  SpO2: 97% 98%    CONSTITUTIONAL: Alert, responds appropriately to questions. Well-appearing; well-nourished HEAD: Normocephalic, atraumatic EYES: Conjunctivae clear, pupils appear equal, sclera nonicteric ENT: normal nose; moist mucous membranes NECK: Supple, normal ROM CARD: RRR; S1 and S2 appreciated RESP: Normal chest excursion without splinting or tachypnea; breath sounds clear and equal bilaterally; no wheezes, no rhonchi, no rales, no hypoxia or respiratory distress, speaking full sentences ABD/GI: Non-distended; soft, mild tenderness diffusely without guarding or rebound, incision sites are clean, dry and intact.  There is minimal redness, bruising around some of her incision sites but no increased warmth, fluctuance, induration and no purulent drainage. BACK: The back appears normal EXT: Normal ROM in all joints; no deformity noted, no edema SKIN: Normal color for age and race; warm; no rash on exposed skin NEURO: Moves all extremities equally, normal speech PSYCH: The patient's mood and manner are appropriate.   ED Results / Procedures / Treatments   LABS: (all labs ordered are listed, but only abnormal results are displayed) Labs Reviewed  COMPREHENSIVE METABOLIC PANEL - Abnormal; Notable for the following components:      Result Value   Sodium 133 (*)    Glucose, Bld 251 (*)     BUN 22 (*)    AST 77 (*)    ALT 134 (*)    All other components within normal limits  CBC WITH DIFFERENTIAL/PLATELET  LIPASE, BLOOD     EKG:   RADIOLOGY: My personal review and interpretation of imaging:    I have personally reviewed all radiology reports.   No results found.   PROCEDURES:  Critical Care performed: No      Procedures    IMPRESSION / MDM / ASSESSMENT AND PLAN / ED COURSE  I reviewed the triage vital signs and  the nursing notes.    Patient here with concerns for wound dehiscence.  Incision sites are clean, dry and intact without bleeding.    DIFFERENTIAL DIAGNOSIS (includes but not limited to):   Wound dehiscence, dermatitis/allergic reaction to bandaging/adhesive, no signs of cellulitis, low suspicion for bowel obstruction, postoperative abscess, pancreatitis   Patient's presentation is most consistent with acute complicated illness / injury requiring diagnostic workup.   PLAN: Patient's wounds are currently clean, dry and intact.  She does have a minimal amount of small dried blood to one of her incision sites but it is completely closed.  There is no wound dehiscence.  She does have what looks like some contact/irritant dermatitis around her incision sites likely from the adhesive.  This does not look like cellulitis.  She does report feeling like she is still having pain, intermittent nausea and vomiting and bloating since her surgery.  She does not feel that it is getting worse but she also does not feel like it is getting better.  She reports feeling cold but denies having any fevers.  She is afebrile here today.  Labs show no leukocytosis.  Minimally elevated AST and ALT consistent with prior labs.  Normal total bilirubin and lipase.  Discussed with patient given her continued symptoms that we could obtain a CT of her abdomen pelvis today to rule out any postoperative complications.  Patient is reluctant to proceed stating that she would not  have come to the emergency department for her continued symptoms but is only here to have this wound inspected.  She does not want further workup and plans to follow-up with her general surgeon.  Discussed return precautions with patient.  Patient and husband verbalized understanding.   MEDICATIONS GIVEN IN ED: Medications - No data to display   ED COURSE:  At this time, I do not feel there is any life-threatening condition present. I reviewed all nursing notes, vitals, pertinent previous records.  All lab and urine results, EKGs, imaging ordered have been independently reviewed and interpreted by myself.  I reviewed all available radiology reports from any imaging ordered this visit.  Based on my assessment, I feel the patient is safe to be discharged home without further emergent workup and can continue workup as an outpatient as needed. Discussed all findings, treatment plan as well as usual and customary return precautions.  They verbalize understanding and are comfortable with this plan.  Outpatient follow-up has been provided as needed.  All questions have been answered.    CONSULTS:  none   OUTSIDE RECORDS REVIEWED: Reviewed recent surgical notes.       FINAL CLINICAL IMPRESSION(S) / ED DIAGNOSES   Final diagnoses:  Post-operative pain  Bleeding from wound     Rx / DC Orders   ED Discharge Orders     None        Note:  This document was prepared using Dragon voice recognition software and may include unintentional dictation errors.   Monterrius Cardosa, Layla Maw, DO 07/18/23 [phone]

## 2023-07-18 NOTE — Discharge Instructions (Addendum)
 Please take your pain and nausea medication as prescribed.  Please follow-up with surgery as scheduled.  If you develop worsening pain, vomiting that does not stop despite medications, fever of 100.4 or higher, yellowing of your skin or eyes, please return to the emergency department.  Please clean the surgical incision sites very gently.  You may cover with a bandage as needed.

## 2023-07-22 ENCOUNTER — Encounter: Admitting: Physician Assistant

## 2023-07-23 ENCOUNTER — Encounter: Admitting: General Surgery

## 2023-07-28 ENCOUNTER — Encounter: Admitting: General Surgery
